# Patient Record
Sex: Female | Born: 1945 | Race: White | Hispanic: No | Marital: Married | Smoking: Current every day smoker
Health system: Southern US, Community
[De-identification: ages and names within clinical notes are randomized; demographics above are authoritative.]

## PROBLEM LIST (undated history)

## (undated) DIAGNOSIS — M4854XA Collapsed vertebra, not elsewhere classified, thoracic region, initial encounter for fracture: Secondary | ICD-10-CM

## (undated) DIAGNOSIS — I10 Essential (primary) hypertension: Secondary | ICD-10-CM

## (undated) DIAGNOSIS — R911 Solitary pulmonary nodule: Secondary | ICD-10-CM

## (undated) DIAGNOSIS — IMO0002 Reserved for concepts with insufficient information to code with codable children: Secondary | ICD-10-CM

## (undated) DIAGNOSIS — K5900 Constipation, unspecified: Secondary | ICD-10-CM

## (undated) DIAGNOSIS — IMO0001 Reserved for inherently not codable concepts without codable children: Secondary | ICD-10-CM

## (undated) DIAGNOSIS — J449 Chronic obstructive pulmonary disease, unspecified: Secondary | ICD-10-CM

## (undated) DIAGNOSIS — K429 Umbilical hernia without obstruction or gangrene: Secondary | ICD-10-CM

## (undated) DIAGNOSIS — I714 Abdominal aortic aneurysm, without rupture, unspecified: Secondary | ICD-10-CM

## (undated) DIAGNOSIS — C801 Malignant (primary) neoplasm, unspecified: Secondary | ICD-10-CM

## (undated) DIAGNOSIS — M1990 Unspecified osteoarthritis, unspecified site: Secondary | ICD-10-CM

## (undated) DIAGNOSIS — J45909 Unspecified asthma, uncomplicated: Secondary | ICD-10-CM

## (undated) DIAGNOSIS — J189 Pneumonia, unspecified organism: Secondary | ICD-10-CM

## (undated) DIAGNOSIS — H3530 Unspecified macular degeneration: Secondary | ICD-10-CM

## (undated) DIAGNOSIS — E0590 Thyrotoxicosis, unspecified without thyrotoxic crisis or storm: Secondary | ICD-10-CM

## (undated) HISTORY — DX: Solitary pulmonary nodule: R91.1

## (undated) HISTORY — DX: Collapsed vertebra, not elsewhere classified, thoracic region, initial encounter for fracture: M48.54XA

## (undated) HISTORY — PX: APPENDECTOMY: SHX54

## (undated) HISTORY — DX: Reserved for concepts with insufficient information to code with codable children: IMO0002

## (undated) HISTORY — DX: Chronic obstructive pulmonary disease, unspecified: J44.9

## (undated) HISTORY — DX: Reserved for inherently not codable concepts without codable children: IMO0001

## (undated) HISTORY — DX: Essential (primary) hypertension: I10

## (undated) HISTORY — PX: DILATION AND CURETTAGE OF UTERUS: SHX78

## (undated) HISTORY — DX: Unspecified asthma, uncomplicated: J45.909

---

## 2013-03-10 ENCOUNTER — Ambulatory Visit
Admission: RE | Admit: 2013-03-10 | Discharge: 2013-03-10 | Disposition: A | Discharge status: Home / Self Care | Payer: Managed Care, Other (non HMO) | Source: Ambulatory Visit | Attending: Emergency Medicine | Admitting: Emergency Medicine

## 2013-03-10 ENCOUNTER — Other Ambulatory Visit: Payer: Self-pay | Admitting: Emergency Medicine

## 2013-03-10 DIAGNOSIS — J189 Pneumonia, unspecified organism: Secondary | ICD-10-CM

## 2013-03-10 MED ORDER — IOHEXOL 300 MG/ML  SOLN
75.0000 mL | Freq: Once | INTRAMUSCULAR | Status: AC | PRN
  Administered 2013-03-10: 75 mL via INTRAVENOUS

## 2013-03-24 ENCOUNTER — Encounter: Payer: Self-pay | Admitting: Emergency Medicine

## 2013-03-26 ENCOUNTER — Ambulatory Visit (INDEPENDENT_AMBULATORY_CARE_PROVIDER_SITE_OTHER): Payer: Managed Care, Other (non HMO) | Admitting: Emergency Medicine

## 2013-03-26 ENCOUNTER — Encounter: Payer: Self-pay | Admitting: Emergency Medicine

## 2013-03-26 VITALS — BP 100/70 | HR 100 | Ht 64.0 in | Wt 146.8 lb

## 2013-03-26 DIAGNOSIS — R918 Other nonspecific abnormal finding of lung field: Secondary | ICD-10-CM | POA: Insufficient documentation

## 2013-03-26 DIAGNOSIS — J449 Chronic obstructive pulmonary disease, unspecified: Secondary | ICD-10-CM | POA: Insufficient documentation

## 2013-03-26 DIAGNOSIS — F17200 Nicotine dependence, unspecified, uncomplicated: Secondary | ICD-10-CM | POA: Insufficient documentation

## 2013-03-26 NOTE — Patient Instructions (Signed)
Walking oximetry today We will repeat your CT scan chest in 6 months Full pulmonary function testing at your next office visit Follow with Dr Delton Coombes next available with full PFT.

## 2013-03-26 NOTE — Assessment & Plan Note (Signed)
-   will plan to repeat her CT scan in 6 months for interval change

## 2013-03-26 NOTE — Assessment & Plan Note (Signed)
She is cutting down, is now smoking 1 pk/day (from 2pk/day).  - discussed cessation, not ready to set quit date at this time.

## 2013-03-26 NOTE — Progress Notes (Signed)
Subjective:    Patient ID: Amber Santiago, female    DOB: [birth year], 67 y.o.   MRN: [id number]  HPI 67 yo smoker (30 pk-yrs), hx of HTN, [facility] and for small pulm nodules seen on CT scan chest 03/10/13. Probably allergies, ? Hx asthma in the past. She saw Dr Steva Ready for an AE-COPD +/- PNA, treated as an outpt. Was given O2 at that time.  She is active, is able to exert but paces herself. Wheezes occasionally.    Review of Systems  Constitutional: Negative for fever and unexpected weight change.  HENT: Positive for sinus pressure. Negative for congestion, dental problem, ear pain, nosebleeds, postnasal drip, rhinorrhea, sneezing, sore throat and trouble swallowing.   Eyes: Negative for redness and itching.  Respiratory: Positive for cough and shortness of breath. Negative for chest tightness and wheezing.   Cardiovascular: Negative for palpitations and leg swelling.  Gastrointestinal: Negative for nausea and vomiting.  Genitourinary: Negative for dysuria.  Musculoskeletal: Negative for joint swelling.  Skin: Negative for rash.  Neurological: Negative for headaches.  Hematological: Does not bruise/bleed easily.  Psychiatric/Behavioral: The patient is nervous/anxious.    Past Medical History  Diagnosis Date  . COPD (chronic obstructive pulmonary disease)   . Asthma   . Hypertension   . Lung nodule      No family history on file.   History   Social History  . Marital Status: Married    Spouse Name: N/A    Number of Children: N/A  . Years of Education: N/A   Occupational History  . retired    Social History Main Topics  . Smoking status: Current Every Day Smoker -- 1.00 packs/day for 30 years    Types: Cigarettes  . Smokeless tobacco: Not on file  . Alcohol Use: No  . Drug Use: No  . Sexual Activity: Not on file   Other Topics Concern  . Not on file   Social History Narrative  . No narrative on file  originally from [place], has lived in the [place], [place], [place], [place]   Allergies  Allergen Reactions  . Amoxicillin      Outpatient Prescriptions Prior to Visit  Medication Sig Dispense Refill  . albuterol (PROVENTIL HFA;VENTOLIN HFA) 108 (90 BASE) MCG/ACT inhaler Inhale 2 puffs into the lungs every 4 (four) hours as needed for wheezing or shortness of breath.      . benzonatate (TESSALON) 100 MG capsule Take 100 mg by mouth 3 (three) times daily as needed for cough.       No facility-administered medications prior to visit.       Objective:   Physical Exam Filed Vitals:   03/26/13 1117  BP: 100/70  Pulse: 100  Height: 5\' 4"  (1.626 m)  Weight: 146 lb 12.8 oz (66.588 kg)  SpO2: 95%   Gen: Pleasant, well-nourished, in no distress,  normal affect  ENT: No lesions,  mouth clear,  oropharynx clear, no postnasal drip  Neck: No JVD, no TMG, no carotid bruits  Lungs: No use of accessory muscles, no dullness to percussion, clear without rales or rhonchi  Cardiovascular: RRR, heart sounds normal, no murmur or gallops, no peripheral edema  Musculoskeletal: No deformities, no cyanosis or clubbing  Neuro: alert, non focal  Skin: Warm, no lesions or rashes   03/10/13 --  FINDINGS:  On lung window images, there is significant biapical pleural and  parenchymal scarring present. Within the left upper lobe there  is a  4 mm noncalcified nodule anteriorly on image number 18 and a 4 mm  noncalcified nodule more inferiorly and anteriorly on image number  21. There is a peripheral noncalcified nodule in the right upper  lobe subpleural in location of 5 mm in diameter, and a calcified  granuloma is noted posteriorly within the right upper lobe near the  apex. A 3 mm noncalcified nodule is noted in the right middle lobe  on image number 36. These findings may all be due to prior  granulomatous disease, but a followup CT of the chest in 3-4 months  is recommended to evaluate stability. No lung  infiltrate is seen and  there is no evidence of pleural effusion. There is scarring noted  within the lingula. Mild paraseptal emphysema is present. The  central airway is patent.  On soft tissue window images, the thyroid gland is normal in size.  The thoracic aorta opacifies with no acute abnormality and the  origins of the great vessels appear patent. The pulmonary arteries  are well opacified with no acute abnormality noted. Only a few small  mediastinal lymph nodes are present none of which appear  pathologically enlarged. Faint coronary artery calcification is  present within the distribution of the left anterior descending  artery. No definite abnormality of the upper abdomen is visualized.  Direct correlation with the outside chest x-ray would be helpful to  determine the etiology of the abnormality questioned on outside  chest x-ray.  IMPRESSION:  1. Small lung nodules at least one of which is calcified suggesting  a possible granulomatous origin. However a followup CT of the chest  is recommended in 3-4 months to assess stability.  2. No calcified mass is noted as questioned on outside chest x-ray.  Direct correlation with that outside chest x-ray would be helpful.  3. Mild paraseptal emphysema.      Assessment & Plan:  Tobacco use disorder She is cutting down, is now smoking 1 pk/day (from 2pk/day).  - discussed cessation, not ready to set quit date at this time.   Pulmonary nodules - will plan to repeat her CT scan in 6 months for interval change   COPD (chronic obstructive pulmonary disease) - albuterol prn for now - full PFT to see if she will benefit from scheduled BD's

## 2013-03-26 NOTE — Assessment & Plan Note (Signed)
-   albuterol prn for now - full PFT to see if she will benefit from scheduled BD's

## 2013-05-12 ENCOUNTER — Ambulatory Visit: Payer: Managed Care, Other (non HMO) | Admitting: Emergency Medicine

## 2013-06-16 ENCOUNTER — Ambulatory Visit (INDEPENDENT_AMBULATORY_CARE_PROVIDER_SITE_OTHER): Payer: Managed Care, Other (non HMO) | Admitting: Emergency Medicine

## 2013-06-16 ENCOUNTER — Encounter (INDEPENDENT_AMBULATORY_CARE_PROVIDER_SITE_OTHER): Payer: Managed Care, Other (non HMO) | Admitting: Emergency Medicine

## 2013-06-16 ENCOUNTER — Encounter: Payer: Self-pay | Admitting: Emergency Medicine

## 2013-06-16 VITALS — BP 110/68 | HR 83

## 2013-06-16 DIAGNOSIS — J449 Chronic obstructive pulmonary disease, unspecified: Secondary | ICD-10-CM

## 2013-06-16 DIAGNOSIS — J4489 Other specified chronic obstructive pulmonary disease: Secondary | ICD-10-CM

## 2013-06-16 LAB — PULMONARY FUNCTION TEST
DL/VA % pred: 87 %
DL/VA: 4.2 ml/min/mmHg/L
DLCO unc % pred: 59 %
DLCO unc: 14.4 ml/min/mmHg
FEF 25-75 Post: 0.56 L/sec
FEF 25-75 Pre: 0.35 L/sec
FEF2575-%CHANGE-POST: 58 %
FEF2575-%PRED-POST: 28 %
FEF2575-%Pred-Pre: 18 %
FEV1-%CHANGE-POST: 19 %
FEV1-%Pred-Post: 43 %
FEV1-%Pred-Pre: 36 %
FEV1-Post: 1 L
FEV1-Pre: 0.84 L
FEV1FVC-%Change-Post: 7 %
FEV1FVC-%Pred-Pre: 67 %
FEV6-%Change-Post: 6 %
FEV6-%PRED-POST: 58 %
FEV6-%Pred-Pre: 54 %
FEV6-POST: 1.69 L
FEV6-Pre: 1.59 L
FEV6FVC-%CHANGE-POST: -3 %
FEV6FVC-%PRED-PRE: 101 %
FEV6FVC-%Pred-Post: 97 %
FVC-%Change-Post: 11 %
FVC-%PRED-PRE: 53 %
FVC-%Pred-Post: 60 %
FVC-POST: 1.82 L
FVC-Pre: 1.63 L
POST FEV1/FVC RATIO: 55 %
PRE FEV6/FVC RATIO: 97 %
Post FEV6/FVC ratio: 94 %
Pre FEV1/FVC ratio: 51 %
RV % PRED: 182 %
RV: 3.95 L
TLC % PRED: 122 %
TLC: 6.21 L

## 2013-06-16 NOTE — Progress Notes (Signed)
PFT done today. 

## 2013-06-16 NOTE — Assessment & Plan Note (Signed)
Discussed PFT with her today.  - trial Spiriva  - albuterol prn - rov 1

## 2013-06-16 NOTE — Patient Instructions (Addendum)
We will start Spiriva daily to see if it will help your breathing Follow with Dr Lamonte Sakai in 1 month to discuss whether the medication is helping you Call our office if you have any trouble with the medication

## 2013-06-16 NOTE — Progress Notes (Signed)
Subjective:    Patient ID: Amber Santiago, female    DOB: [birth year], 68 y.o.   MRN: [id number]  HPI 68 yo smoker (30 pk-yrs), hx of HTN, [facility] and for small pulm nodules seen on CT scan chest 03/10/13. Probably allergies, ? Hx asthma in the past. She saw Dr Doristine Johns for an AE-COPD +/- PNA, treated as an outpt. Was given O2 at that time.  She is active, is able to exert but paces herself. Wheezes occasionally.   ROV 06/16/13 -- follows up for dyspnea and smoking. PFT today > severe AFL, borderline BD response. Hyperinflated volumes, decreased DLCO that corrects for [place]. She admits today that her breathing has declined over the years.    Review of Systems  Constitutional: Negative for fever and unexpected weight change.  HENT: Positive for sinus pressure. Negative for congestion, dental problem, ear pain, nosebleeds, postnasal drip, rhinorrhea, sneezing, sore throat and trouble swallowing.   Eyes: Negative for redness and itching.  Respiratory: Positive for cough and shortness of breath. Negative for chest tightness and wheezing.   Cardiovascular: Negative for palpitations and leg swelling.  Gastrointestinal: Negative for nausea and vomiting.  Genitourinary: Negative for dysuria.  Musculoskeletal: Negative for joint swelling.  Skin: Negative for rash.  Neurological: Negative for headaches.  Hematological: Does not bruise/bleed easily.  Psychiatric/Behavioral: The patient is nervous/anxious.    Past Medical History  Diagnosis Date  . COPD (chronic obstructive pulmonary disease)   . Asthma   . Hypertension   . Lung nodule      No family history on file.   History   Social History  . Marital Status: Married    Spouse Name: N/A    Number of Children: N/A  . Years of Education: N/A   Occupational History  . retired    Social History Main Topics  . Smoking status: Current Every Day Smoker -- 1.00 packs/day for 30 years    Types: Cigarettes  .  Smokeless tobacco: Not on file  . Alcohol Use: No  . Drug Use: No  . Sexual Activity: Not on file   Other Topics Concern  . Not on file   Social History Narrative  . No narrative on file  originally from [place], has lived in the [place], [place], [place], [place]   Allergies  Allergen Reactions  . Penicillins Anaphylaxis  . Amoxicillin      Outpatient Prescriptions Prior to Visit  Medication Sig Dispense Refill  . albuterol (PROVENTIL HFA;VENTOLIN HFA) 108 (90 BASE) MCG/ACT inhaler Inhale 2 puffs into the lungs every 4 (four) hours as needed for wheezing or shortness of breath.      . benzonatate (TESSALON) 100 MG capsule Take 100 mg by mouth 3 (three) times daily as needed for cough.      . furosemide (LASIX) 20 MG tablet 1 tablet daily.      Marland Kitchen losartan (COZAAR) 50 MG tablet 1 tablet daily.       No facility-administered medications prior to visit.       Objective:   Physical Exam Filed Vitals:   06/16/13 1623  BP: 110/68  Pulse: 83  SpO2: 95%   Gen: Pleasant, well-nourished, in no distress,  normal affect  ENT: No lesions,  mouth clear,  oropharynx clear, no postnasal drip  Neck: No JVD, no TMG, no carotid bruits  Lungs: No use of accessory muscles, no dullness to percussion, clear without rales or rhonchi  Cardiovascular: RRR, heart  sounds normal, no murmur or gallops, no peripheral edema  Musculoskeletal: No deformities, no cyanosis or clubbing  Neuro: alert, non focal  Skin: Warm, no lesions or rashes   03/10/13 --  FINDINGS:  On lung window images, there is significant biapical pleural and  parenchymal scarring present. Within the left upper lobe there is a  4 mm noncalcified nodule anteriorly on image number 18 and a 4 mm  noncalcified nodule more inferiorly and anteriorly on image number  21. There is a peripheral noncalcified nodule in the right upper  lobe subpleural in location of 5 mm in diameter, and a calcified  granuloma is noted  posteriorly within the right upper lobe near the  apex. A 3 mm noncalcified nodule is noted in the right middle lobe  on image number 36. These findings may all be due to prior  granulomatous disease, but a followup CT of the chest in 3-4 months  is recommended to evaluate stability. No lung infiltrate is seen and  there is no evidence of pleural effusion. There is scarring noted  within the lingula. Mild paraseptal emphysema is present. The  central airway is patent.  On soft tissue window images, the thyroid gland is normal in size.  The thoracic aorta opacifies with no acute abnormality and the  origins of the great vessels appear patent. The pulmonary arteries  are well opacified with no acute abnormality noted. Only a few small  mediastinal lymph nodes are present none of which appear  pathologically enlarged. Faint coronary artery calcification is  present within the distribution of the left anterior descending  artery. No definite abnormality of the upper abdomen is visualized.  Direct correlation with the outside chest x-ray would be helpful to  determine the etiology of the abnormality questioned on outside  chest x-ray.  IMPRESSION:  1. Small lung nodules at least one of which is calcified suggesting  a possible granulomatous origin. However a followup CT of the chest  is recommended in 3-4 months to assess stability.  2. No calcified mass is noted as questioned on outside chest x-ray.  Direct correlation with that outside chest x-ray would be helpful.  3. Mild paraseptal emphysema.      Assessment & Plan:  COPD (chronic obstructive pulmonary disease) Discussed PFT with her today.  - trial Spiriva  - albuterol prn - rov 1

## 2013-07-17 ENCOUNTER — Ambulatory Visit (INDEPENDENT_AMBULATORY_CARE_PROVIDER_SITE_OTHER): Payer: Managed Care, Other (non HMO) | Admitting: Emergency Medicine

## 2013-07-17 ENCOUNTER — Encounter: Payer: Self-pay | Admitting: Emergency Medicine

## 2013-07-17 VITALS — BP 128/72 | HR 63 | Temp 97.3°F | Ht 64.0 in | Wt 144.0 lb

## 2013-07-17 DIAGNOSIS — R918 Other nonspecific abnormal finding of lung field: Secondary | ICD-10-CM

## 2013-07-17 DIAGNOSIS — J449 Chronic obstructive pulmonary disease, unspecified: Secondary | ICD-10-CM

## 2013-07-17 MED ORDER — AEROCHAMBER MV MISC: Status: DC

## 2013-07-17 NOTE — Assessment & Plan Note (Signed)
Need a repeat CT scan in June '15

## 2013-07-17 NOTE — Assessment & Plan Note (Signed)
Stay off Spiriva Continue loratadine 10mg  daily.  You need to keep working on trying to decrease your smoking.  We will try starting Symbicort 2 puffs twice a day with a spacer. Please gargle and rinse your mouth after using Follow with Dr Lamonte Sakai in 4 months or sooner if you have any problems

## 2013-07-17 NOTE — Addendum Note (Signed)
Addended by: Maurice March on: 07/17/2013 04:06 PM   Modules accepted: Orders

## 2013-07-17 NOTE — Patient Instructions (Addendum)
Stay off Spiriva Continue loratadine 10mg  daily.  You need to keep working on trying to decrease your smoking.  We will try starting Symbicort 2 puffs twice a day with a spacer. Please gargle and rinse your mouth after using Call our office to let us know if you have benefited from the medication Walking oximetry today Follow with Dr Lamonte Sakai in 4 months or sooner if you have any problems.

## 2013-07-17 NOTE — Progress Notes (Signed)
Subjective:    Patient ID: Amber Santiago, female    DOB: Jun 04, 1945, 68 y.o.   MRN: [id number]  HPI 68 yo smoker (30 pk-yrs), hx of HTN, [facility] for [facility] and for small pulm nodules seen on CT scan chest 03/10/13. Probably allergies, ? Hx asthma in the past. She saw Dr Doristine Johns for an AE-COPD +/- PNA, treated as an outpt. Was given O2 at that time.  She is active, is able to exert but paces herself. Wheezes occasionally.   ROV 06/16/13 -- follows up for dyspnea and smoking. PFT today > severe AFL, borderline BD response. Hyperinflated volumes, decreased DLCO that corrects for Va. She admits today that her breathing has declined over the years.   ROV 07/17/13 -- follows for COPD. Last time we started Spiriva to see if she would benefit. She stopped it after 11 days due to cough and the fact that it had not helped. She has been doing well. She remains on loratadine. She does complain of occasional dyspnea.    Review of Systems  Constitutional: Negative for fever and unexpected weight change.  HENT: Positive for sinus pressure. Negative for congestion, dental problem, ear pain, nosebleeds, postnasal drip, rhinorrhea, sneezing, sore throat and trouble swallowing.   Eyes: Negative for redness and itching.  Respiratory: Positive for cough and shortness of breath. Negative for chest tightness and wheezing.   Cardiovascular: Negative for palpitations and leg swelling.  Gastrointestinal: Negative for nausea and vomiting.  Genitourinary: Negative for dysuria.  Musculoskeletal: Negative for joint swelling.  Skin: Negative for rash.  Neurological: Negative for headaches.  Hematological: Does not bruise/bleed easily.  Psychiatric/Behavioral: The patient is nervous/anxious.       Objective:   Physical Exam Filed Vitals:   07/17/13 1504  BP: 128/72  Pulse: 63  Temp: 97.3 F (36.3 C)  TempSrc: Oral  Height: 5\' 4"  (1.626 m)  Weight: 144 lb (65.318 kg)  SpO2: 98%   Gen:  Pleasant, well-nourished, in no distress,  normal affect  ENT: No lesions,  mouth clear,  oropharynx clear, no postnasal drip  Neck: No JVD, no TMG, no carotid bruits  Lungs: No use of accessory muscles, no dullness to percussion, clear without rales or rhonchi  Cardiovascular: RRR, heart sounds normal, no murmur or gallops, no peripheral edema  Musculoskeletal: No deformities, no cyanosis or clubbing  Neuro: alert, non focal  Skin: Warm, no lesions or rashes   03/10/13 --  FINDINGS:  On lung window images, there is significant biapical pleural and  parenchymal scarring present. Within the left upper lobe there is a  4 mm noncalcified nodule anteriorly on image number 18 and a 4 mm  noncalcified nodule more inferiorly and anteriorly on image number  21. There is a peripheral noncalcified nodule in the right upper  lobe subpleural in location of 5 mm in diameter, and a calcified  granuloma is noted posteriorly within the right upper lobe near the  apex. A 3 mm noncalcified nodule is noted in the right middle lobe  on image number 36. These findings may all be due to prior  granulomatous disease, but a followup CT of the chest in 3-4 months  is recommended to evaluate stability. No lung infiltrate is seen and  there is no evidence of pleural effusion. There is scarring noted  within the lingula. Mild paraseptal emphysema is present. The  central airway is patent.  On soft tissue window images, the thyroid gland is normal in  size.  The thoracic aorta opacifies with no acute abnormality and the  origins of the great vessels appear patent. The pulmonary arteries  are well opacified with no acute abnormality noted. Only a few small  mediastinal lymph nodes are present none of which appear  pathologically enlarged. Faint coronary artery calcification is  present within the distribution of the left anterior descending  artery. No definite abnormality of the upper abdomen is visualized.   Direct correlation with the outside chest x-ray would be helpful to  determine the etiology of the abnormality questioned on outside  chest x-ray.  IMPRESSION:  1. Small lung nodules at least one of which is calcified suggesting  a possible granulomatous origin. However a followup CT of the chest  is recommended in 3-4 months to assess stability.  2. No calcified mass is noted as questioned on outside chest x-ray.  Direct correlation with that outside chest x-ray would be helpful.  3. Mild paraseptal emphysema.      Assessment & Plan:  COPD (chronic obstructive pulmonary disease) Stay off Spiriva Continue loratadine 10mg  daily.  You need to keep working on trying to decrease your smoking.  We will try starting Symbicort 2 puffs twice a day with a spacer. Please gargle and rinse your mouth after using Follow with Dr Lamonte Sakai in 4 months or sooner if you have any problems  Pulmonary nodules Need a repeat CT scan in June '15

## 2013-08-17 ENCOUNTER — Telehealth: Payer: Self-pay | Admitting: Emergency Medicine

## 2013-08-17 MED ORDER — BUDESONIDE-FORMOTEROL FUMARATE 160-4.5 MCG/ACT IN AERO: 2.0000 | INHALATION_SPRAY | Freq: Two times a day (BID) | RESPIRATORY_TRACT | Status: DC

## 2013-08-17 NOTE — Telephone Encounter (Signed)
Spoke with the pt She states that she is needing rx called in for symbicort  She feels that this has been helping with her SOB I verified which strength he gave her, and sent rx to pharm

## 2013-08-20 ENCOUNTER — Telehealth: Payer: Self-pay | Admitting: Emergency Medicine

## 2013-08-20 NOTE — Telephone Encounter (Signed)
Spoke with the pt  She states already spoke with someone regarding ct appt and nothing further needed

## 2013-09-25 ENCOUNTER — Ambulatory Visit (INDEPENDENT_AMBULATORY_CARE_PROVIDER_SITE_OTHER)
Admission: RE | Admit: 2013-09-25 | Discharge: 2013-09-25 | Disposition: A | Discharge status: Home / Self Care | Payer: Managed Care, Other (non HMO) | Source: Ambulatory Visit | Attending: Emergency Medicine | Admitting: Emergency Medicine

## 2013-09-25 DIAGNOSIS — R918 Other nonspecific abnormal finding of lung field: Secondary | ICD-10-CM

## 2013-09-30 ENCOUNTER — Telehealth: Payer: Self-pay | Admitting: Emergency Medicine

## 2013-09-30 NOTE — Telephone Encounter (Signed)
Spoke with the pt  She states that she was seen by her GYN and was referred to Urologist for problems with her "water works" She states that she was unable to keep the appt they gave her and b/c of this they were "very rude"  She is asking if [facility] will refer her to a different Urologist besides Dr Milta Deiters  Per RB- needs to be done by PCP or whoever originally referred her ATC the pt back and NA  LMTCB

## 2013-09-30 NOTE — Telephone Encounter (Signed)
Pt returned Leslie's call Advised her to call her PCP or the original provider whom referred her to Urology - pt okay with this and verbalized her understanding.  Pt is now requesting her 6.19.15 CT Chest results and is okay with a call back tomorrow 6.25 Dr Lamonte Sakai please advise, thank you.

## 2013-10-01 NOTE — Telephone Encounter (Signed)
Pt advised. Mayank Teuscher, CMA  

## 2013-10-01 NOTE — Telephone Encounter (Signed)
Please let her know that her pulmonary nodules are all stable - no change. We will need to continue to follow them over time. This is good news

## 2013-10-01 NOTE — Telephone Encounter (Signed)
[id number] x1 

## 2013-12-03 ENCOUNTER — Encounter: Payer: Self-pay | Admitting: Emergency Medicine

## 2013-12-03 ENCOUNTER — Ambulatory Visit (INDEPENDENT_AMBULATORY_CARE_PROVIDER_SITE_OTHER): Payer: Managed Care, Other (non HMO) | Admitting: Emergency Medicine

## 2013-12-03 VITALS — BP 120/80 | HR 62 | Ht 64.0 in | Wt 147.6 lb

## 2013-12-03 DIAGNOSIS — R918 Other nonspecific abnormal finding of lung field: Secondary | ICD-10-CM

## 2013-12-03 DIAGNOSIS — J449 Chronic obstructive pulmonary disease, unspecified: Secondary | ICD-10-CM

## 2013-12-03 MED ORDER — ALBUTEROL SULFATE HFA 108 (90 BASE) MCG/ACT IN AERS: 2.0000 | INHALATION_SPRAY | RESPIRATORY_TRACT | Status: DC | PRN

## 2013-12-03 NOTE — Progress Notes (Signed)
Subjective:    Patient ID: Amber Santiago, female    DOB: April 29, 1945, 68 y.o.   MRN: [id number]  HPI 68 yo smoker (30 pk-yrs), hx of HTN, [facility] and for small pulm nodules seen on CT scan chest 03/10/13. Probably allergies, ? Hx asthma in the past. She saw Dr Doristine Johns for an AE-COPD +/- PNA, treated as an outpt. Was given O2 at that time.  She is active, is able to exert but paces herself. Wheezes occasionally.   ROV 06/16/13 -- follows up for dyspnea and smoking. PFT today > severe AFL, borderline BD response. Hyperinflated volumes, decreased DLCO that corrects for Va. She admits today that her breathing has declined over the years.   ROV 07/17/13 -- follows for COPD. Last time we started Spiriva to see if she would benefit. She stopped it after 11 days due to cough and the fact that it had not helped. She has been doing well. She remains on loratadine. She does complain of occasional dyspnea.   ROV 12/03/13 -- follow up visit for COPD. She started symbicort last time. Her breathing has improved significantly. She is on loratadine.  Will need to get her CT chest in Oct or Nov (she will likely be in [place] in December).    Review of Systems  Constitutional: Negative for fever and unexpected weight change.  HENT: Positive for sinus pressure. Negative for congestion, dental problem, ear pain, nosebleeds, postnasal drip, rhinorrhea, sneezing, sore throat and trouble swallowing.   Eyes: Negative for redness and itching.  Respiratory: Positive for cough and shortness of breath. Negative for chest tightness and wheezing.   Cardiovascular: Negative for palpitations and leg swelling.  Gastrointestinal: Negative for nausea and vomiting.  Genitourinary: Negative for dysuria.  Musculoskeletal: Negative for joint swelling.  Skin: Negative for rash.  Neurological: Negative for headaches.  Hematological: Does not bruise/bleed easily.  Psychiatric/Behavioral: The patient  is nervous/anxious.       Objective:   Physical Exam Filed Vitals:   12/03/13 1531  BP: 120/80  Pulse: 62  Height: 5\' 4"  (1.626 m)  Weight: 147 lb 9.6 oz (66.951 kg)  SpO2: 98%   Gen: Pleasant, well-nourished, in no distress,  normal affect  ENT: No lesions,  mouth clear,  oropharynx clear, no postnasal drip  Neck: No JVD, no TMG, no carotid bruits  Lungs: No use of accessory muscles, no dullness to percussion, clear without rales or rhonchi  Cardiovascular: RRR, heart sounds normal, no murmur or gallops, no peripheral edema  Musculoskeletal: No deformities, no cyanosis or clubbing  Neuro: alert, non focal  Skin: Warm, no lesions or rashes   03/10/13 --  FINDINGS:  On lung window images, there is significant biapical pleural and  parenchymal scarring present. Within the left upper lobe there is a  4 mm noncalcified nodule anteriorly on image number 18 and a 4 mm  noncalcified nodule more inferiorly and anteriorly on image number  21. There is a peripheral noncalcified nodule in the right upper  lobe subpleural in location of 5 mm in diameter, and a calcified  granuloma is noted posteriorly within the right upper lobe near the  apex. A 3 mm noncalcified nodule is noted in the right middle lobe  on image number 36. These findings may all be due to prior  granulomatous disease, but a followup CT of the chest in 3-4 months  is recommended to evaluate stability. No lung infiltrate is seen and  there  is no evidence of pleural effusion. There is scarring noted  within the lingula. Mild paraseptal emphysema is present. The  central airway is patent.  On soft tissue window images, the thyroid gland is normal in size.  The thoracic aorta opacifies with no acute abnormality and the  origins of the great vessels appear patent. The pulmonary arteries  are well opacified with no acute abnormality noted. Only a few small  mediastinal lymph nodes are present none of which appear   pathologically enlarged. Faint coronary artery calcification is  present within the distribution of the left anterior descending  artery. No definite abnormality of the upper abdomen is visualized.  Direct correlation with the outside chest x-ray would be helpful to  determine the etiology of the abnormality questioned on outside  chest x-ray.  IMPRESSION:  1. Small lung nodules at least one of which is calcified suggesting  a possible granulomatous origin. However a followup CT of the chest  is recommended in 3-4 months to assess stability.  2. No calcified mass is noted as questioned on outside chest x-ray.  Direct correlation with that outside chest x-ray would be helpful.  3. Mild paraseptal emphysema.      Assessment & Plan:  COPD (chronic obstructive pulmonary disease) Has been doing well - improved on Symbicort Discussed tobacco cessation   Pulmonary nodules - will repeat her Ct scan early as she is going to [place] in november

## 2013-12-03 NOTE — Assessment & Plan Note (Signed)
Has been doing well - improved on Symbicort Discussed tobacco cessation

## 2013-12-03 NOTE — Patient Instructions (Signed)
Please continue your Symbicort twice a day  We will set up your CT scan for October 2015 Use albuterol 2 puffs as needed for shortness of breath Follow with Dr Lamonte Sakai in 6 months or sooner if you have any problems

## 2013-12-03 NOTE — Assessment & Plan Note (Signed)
-   will repeat her Ct scan early as she is going to [place] in november

## 2014-01-21 ENCOUNTER — Other Ambulatory Visit: Payer: Self-pay | Admitting: Emergency Medicine

## 2014-01-26 ENCOUNTER — Other Ambulatory Visit: Payer: Managed Care, Other (non HMO)

## 2014-02-02 ENCOUNTER — Ambulatory Visit (INDEPENDENT_AMBULATORY_CARE_PROVIDER_SITE_OTHER)
Admission: RE | Admit: 2014-02-02 | Discharge: 2014-02-02 | Disposition: A | Discharge status: Home / Self Care | Payer: Managed Care, Other (non HMO) | Source: Ambulatory Visit | Attending: Emergency Medicine | Admitting: Emergency Medicine

## 2014-02-02 DIAGNOSIS — R918 Other nonspecific abnormal finding of lung field: Secondary | ICD-10-CM

## 2014-02-03 ENCOUNTER — Telehealth: Payer: Self-pay | Admitting: Emergency Medicine

## 2014-02-03 NOTE — Telephone Encounter (Signed)
Called and spoke with the pt and she is requesting the results of the CT scan form RB.  RB please advise of results.  thanks

## 2014-02-04 NOTE — Telephone Encounter (Signed)
Please let the patient know that her Ct scan is stable. There has been no change in her lung nodules at all. We can discuss whether she needs to have any further scans when we see each other in office.

## 2014-02-05 NOTE — Telephone Encounter (Signed)
Spoke with the pt and notified of recs per RB  She verbalized understanding  Nothing further needed 

## 2014-02-05 NOTE — Telephone Encounter (Signed)
Pt returned call & can be reached at [phone].  Amber Santiago

## 2014-02-05 NOTE — Telephone Encounter (Signed)
[id number] x1 

## 2014-02-10 ENCOUNTER — Telehealth: Payer: Self-pay | Admitting: Emergency Medicine

## 2014-02-10 NOTE — Telephone Encounter (Signed)
Patient called back to speak to [facility] about Symbicort, pt states she does not use [facility], she uses Paediatric nurse on [address]

## 2014-02-10 NOTE — Telephone Encounter (Signed)
Received letter from [facility], they will no longer cover Symbicort, per Dr. Lamonte Sakai, call patient and advise her that we will need to switch her to [facility].  Left message for patient to call me back.  Awaiting call from patient.

## 2014-02-10 NOTE — Telephone Encounter (Signed)
Spoke to patient about Symbicort vs Dulera.  Patient does not want to switch to [facility].  Patient states she no longer uses [facility] and she uses [facility] for all of her prescriptions, she says Symbicort works for her and she will continue paying for it if she has too.  Nothing further needed at this time.

## 2014-06-18 ENCOUNTER — Other Ambulatory Visit: Payer: Self-pay | Admitting: Emergency Medicine

## 2014-06-25 ENCOUNTER — Ambulatory Visit (INDEPENDENT_AMBULATORY_CARE_PROVIDER_SITE_OTHER): Payer: Managed Care, Other (non HMO) | Admitting: Emergency Medicine

## 2014-06-25 ENCOUNTER — Encounter: Payer: Self-pay | Admitting: Emergency Medicine

## 2014-06-25 VITALS — BP 120/72 | HR 83 | Ht 63.0 in | Wt 146.0 lb

## 2014-06-25 DIAGNOSIS — J449 Chronic obstructive pulmonary disease, unspecified: Secondary | ICD-10-CM

## 2014-06-25 NOTE — Assessment & Plan Note (Addendum)
Her COPD appears to be stable at this time. She is definitely benefited from Symbicort. I reminded her to be sure to take it twice a day on the schedule as she occasionally cuts it down to once a day. Most of our time was spent discussing smoking cessation. Talked about strategies to cut down and we agreed that we set a new goal of one pack daily to be met by her next visit.    Please continue your Symbicort twice a day  Use albuterol 2 puffs up to every 4 hours if needed for shortness of breath.  We discussed that you would work hard to cut your cigarettes down to [place] Follow with Dr Lamonte Sakai in 6 months or sooner if you have any problems

## 2014-06-25 NOTE — Patient Instructions (Signed)
Please continue your Symbicort twice a day  Use albuterol 2 puffs up to every 4 hours if needed for shortness of breath.  We discussed that you would work hard to cut your cigarettes down to [place] Follow with Dr Lamonte Sakai in 6 months or sooner if you have any problems

## 2014-06-25 NOTE — Progress Notes (Signed)
Subjective:    Patient ID: Amber Santiago, female    DOB: 04/30/1945, 69 y.o.   MRN: [id number]  HPI 69 yo smoker (30 pk-yrs), hx of HTN, [facility] by [facility] Sandhu [facility] for COPD and for small pulm nodules seen on CT scan chest 03/10/13. Probably allergies, ? Hx asthma in the past. She saw Dr Sandhu for an AE-COPD +/- PNA, treated as an outpt. Was given O2 at that time.  She is active, is able to exert but paces herself. Wheezes occasionally.   ROV 06/16/13 -- follows up for dyspnea and smoking. PFT today > severe AFL, borderline BD response. Hyperinflated volumes, decreased DLCO that corrects for Va. She admits today that her breathing has declined over the years.   ROV 07/17/13 -- follows for COPD. Last time we started Spiriva to see if she would benefit. She stopped it after 11 days due to cough and the fact that it had not helped. She has been doing well. She remains on loratadine. She does complain of occasional dyspnea.   ROV 12/03/13 -- follow up visit for COPD. She started symbicort last time. Her breathing has improved significantly. She is on loratadine.  Will need to get her CT chest in Oct or Nov (she will likely be in [place] in December).   ROV 06/25/14 -- follow up today for COPD and pulmonary nodules. She has been doing quite well since last time. She has been taking her symbicort reliably. In the past she has had some episodes of panic, these seem to be happening less frequently. She has not had any flares. Hasn't needed her SABA. She is smoking over a pack a day.    Review of Systems  Constitutional: Negative for fever and unexpected weight change.  HENT: Positive for sinus pressure. Negative for congestion, dental problem, ear pain, nosebleeds, postnasal drip, rhinorrhea, sneezing, sore throat and trouble swallowing.   Eyes: Negative for redness and itching.  Respiratory: Positive for cough and shortness of breath. Negative for chest tightness and wheezing.   Cardiovascular:  Negative for palpitations and leg swelling.  Gastrointestinal: Negative for nausea and vomiting.  Genitourinary: Negative for dysuria.  Musculoskeletal: Negative for joint swelling.  Skin: Negative for rash.  Neurological: Negative for headaches.  Hematological: Does not bruise/bleed easily.  Psychiatric/Behavioral: The patient is nervous/anxious.       Objective:   Physical Exam Filed Vitals:   06/25/14 1626  BP: 120/72  Pulse: 83  Height: 5' 3" (1.6 m)  Weight: 146 lb (66.225 kg)  SpO2: 96%   Gen: Pleasant, well-nourished, in no distress,  normal affect  ENT: No lesions,  mouth clear,  oropharynx clear, no postnasal drip  Neck: No JVD, no TMG, no carotid bruits  Lungs: No use of accessory muscles, no dullness to percussion, clear without rales or rhonchi  Cardiovascular: RRR, heart sounds normal, no murmur or gallops, no peripheral edema  Musculoskeletal: No deformities, no cyanosis or clubbing  Neuro: alert, non focal  Skin: Warm, no lesions or rashes   03/10/13 --  FINDINGS:  On lung window images, there is significant biapical pleural and  parenchymal scarring present. Within the left upper lobe there is a  4 mm noncalcified nodule anteriorly on image number 18 and a 4 mm  noncalcified nodule more inferiorly and anteriorly on image number  21. There is a peripheral noncalcified nodule in the right upper  lobe subpleural in location of 5 mm in diameter, and a calcified  granuloma is noted   posteriorly within the right upper lobe near the  apex. A 3 mm noncalcified nodule is noted in the right middle lobe  on image number 36. These findings may all be due to prior  granulomatous disease, but a followup CT of the chest in 3-4 months  is recommended to evaluate stability. No lung infiltrate is seen and  there is no evidence of pleural effusion. There is scarring noted  within the lingula. Mild paraseptal emphysema is present. The  central airway is patent.  On soft  tissue window images, the thyroid gland is normal in size.  The thoracic aorta opacifies with no acute abnormality and the  origins of the great vessels appear patent. The pulmonary arteries  are well opacified with no acute abnormality noted. Only a few small  mediastinal lymph nodes are present none of which appear  pathologically enlarged. Faint coronary artery calcification is  present within the distribution of the left anterior descending  artery. No definite abnormality of the upper abdomen is visualized.  Direct correlation with the outside chest x-ray would be helpful to  determine the etiology of the abnormality questioned on outside  chest x-ray.  IMPRESSION:  1. Small lung nodules at least one of which is calcified suggesting  a possible granulomatous origin. However a followup CT of the chest  is recommended in 3-4 months to assess stability.  2. No calcified mass is noted as questioned on outside chest x-ray.  Direct correlation with that outside chest x-ray would be helpful.  3. Mild paraseptal emphysema.      Assessment & Plan:  COPD (chronic obstructive pulmonary disease) Her COPD appears to be stable at this time. She is definitely benefited from Symbicort. I reminded her to be sure to take it twice a day on the schedule as she occasionally cuts it down to once a day. Most of our time was spent discussing smoking cessation. Talked about strategies to cut down and we agreed that we set a new goal of one pack daily to be met by her next visit.    Please continue your Symbicort twice a day  Use albuterol 2 puffs up to every 4 hours if needed for shortness of breath.  We discussed that you would work hard to cut your cigarettes down to ONE PACK A DAY Follow with Dr Byrum in 6 months or sooner if you have any problems     

## 2014-07-19 ENCOUNTER — Telehealth: Payer: Self-pay | Admitting: Emergency Medicine

## 2014-07-19 MED ORDER — BUDESONIDE-FORMOTEROL FUMARATE 160-4.5 MCG/ACT IN AERO: 2.0000 | INHALATION_SPRAY | Freq: Two times a day (BID) | RESPIRATORY_TRACT | Status: DC

## 2014-07-19 NOTE — Telephone Encounter (Signed)
Rx has been sent in. Pt's husband is aware. Nothing further was needed.

## 2014-11-25 ENCOUNTER — Encounter (INDEPENDENT_AMBULATORY_CARE_PROVIDER_SITE_OTHER): Payer: Managed Care, Other (non HMO) | Admitting: Ophthalmology

## 2014-11-25 DIAGNOSIS — H3532 Exudative age-related macular degeneration: Secondary | ICD-10-CM | POA: Diagnosis not present

## 2014-11-25 DIAGNOSIS — H43813 Vitreous degeneration, bilateral: Secondary | ICD-10-CM

## 2014-11-25 DIAGNOSIS — H2513 Age-related nuclear cataract, bilateral: Secondary | ICD-10-CM | POA: Diagnosis not present

## 2014-12-23 ENCOUNTER — Encounter (INDEPENDENT_AMBULATORY_CARE_PROVIDER_SITE_OTHER): Payer: Managed Care, Other (non HMO) | Admitting: Ophthalmology

## 2014-12-23 DIAGNOSIS — H3532 Exudative age-related macular degeneration: Secondary | ICD-10-CM

## 2014-12-23 DIAGNOSIS — H43813 Vitreous degeneration, bilateral: Secondary | ICD-10-CM | POA: Diagnosis not present

## 2015-01-14 ENCOUNTER — Other Ambulatory Visit: Payer: Self-pay | Admitting: Emergency Medicine

## 2015-01-19 ENCOUNTER — Encounter (INDEPENDENT_AMBULATORY_CARE_PROVIDER_SITE_OTHER): Payer: Managed Care, Other (non HMO) | Admitting: Ophthalmology

## 2015-01-19 DIAGNOSIS — H43813 Vitreous degeneration, bilateral: Secondary | ICD-10-CM

## 2015-01-19 DIAGNOSIS — H2513 Age-related nuclear cataract, bilateral: Secondary | ICD-10-CM | POA: Diagnosis not present

## 2015-01-19 DIAGNOSIS — H353231 Exudative age-related macular degeneration, bilateral, with active choroidal neovascularization: Secondary | ICD-10-CM

## 2015-02-08 ENCOUNTER — Ambulatory Visit (INDEPENDENT_AMBULATORY_CARE_PROVIDER_SITE_OTHER): Payer: Managed Care, Other (non HMO) | Admitting: Emergency Medicine

## 2015-02-08 ENCOUNTER — Encounter: Payer: Self-pay | Admitting: Emergency Medicine

## 2015-02-08 VITALS — BP 120/84 | HR 56

## 2015-02-08 DIAGNOSIS — R918 Other nonspecific abnormal finding of lung field: Secondary | ICD-10-CM

## 2015-02-08 DIAGNOSIS — F17200 Nicotine dependence, unspecified, uncomplicated: Secondary | ICD-10-CM | POA: Diagnosis not present

## 2015-02-08 DIAGNOSIS — J449 Chronic obstructive pulmonary disease, unspecified: Secondary | ICD-10-CM | POA: Diagnosis not present

## 2015-02-08 DIAGNOSIS — R911 Solitary pulmonary nodule: Secondary | ICD-10-CM

## 2015-02-08 MED ORDER — BUDESONIDE-FORMOTEROL FUMARATE 160-4.5 MCG/ACT IN AERO: INHALATION_SPRAY | RESPIRATORY_TRACT | Status: AC

## 2015-02-08 MED ORDER — ALBUTEROL SULFATE HFA 108 (90 BASE) MCG/ACT IN AERS: 2.0000 | INHALATION_SPRAY | RESPIRATORY_TRACT | Status: DC | PRN

## 2015-02-08 NOTE — Assessment & Plan Note (Signed)
We discussed cessation today. Again we set a goal for her to cut down to one pack a day. She will work on this

## 2015-02-08 NOTE — Assessment & Plan Note (Signed)
We discussed her symptoms and possibly changing her bronchodilators to include LAMA. At this time we have decided to continue the Symbicort twice a day, ensure that she has albuterol to use when necessary.

## 2015-02-08 NOTE — Assessment & Plan Note (Signed)
She needs another repeat CT scan of the chest without contrast to follow-up pulmonary nodules for interval change

## 2015-02-08 NOTE — Progress Notes (Signed)
Subjective:    Patient ID: Amber Santiago, female    DOB: [birth year], 69 y.o.   MRN: [id number]  HPI 69 yo smoker (30 pk-yrs), hx of HTN, [facility] for COPD and for small pulm nodules seen on CT scan chest 03/10/13. Probably allergies, ? Hx asthma in the past. She saw Dr Doristine Johns for an AE-COPD +/- PNA, treated as an outpt. Was given O2 at that time.  She is active, is able to exert but paces herself. Wheezes occasionally.   ROV 06/16/13 -- follows up for dyspnea and smoking. PFT today > severe AFL, borderline BD response. Hyperinflated volumes, decreased DLCO that corrects for Va. She admits today that her breathing has declined over the years.   ROV 07/17/13 -- follows for COPD. Last time we started Spiriva to see if she would benefit. She stopped it after 11 days due to cough and the fact that it had not helped. She has been doing well. She remains on loratadine. She does complain of occasional dyspnea.   ROV 12/03/13 -- follow up visit for COPD. She started symbicort last time. Her breathing has improved significantly. She is on loratadine.  Will need to get her CT chest in Oct or Nov (she will likely be in [place] in December).   ROV 06/25/14 -- follow up today for COPD and pulmonary nodules. She has been doing quite well since last time. She has been taking her symbicort reliably. In the past she has had some episodes of panic, these seem to be happening less frequently. She has not had any flares. Hasn't needed her SABA. She is smoking over a pack a day.   ROV 02/08/15 -- follow-up visit for COPD, continued tobacco use (1+ pk a day), pulmonary nodules that a been stable on serial CT scans, most recent was in October 2015.  She has been dealing with significant eye disease, receiving injections and treatments with opthalmology.  She has also been through a lot of stress from moving to a new house, then moved back to the original house.  She states that her breathing has been  pretty good, there is some association with anxiety.  She feels a heaviness in her chest, has been present for a few days, frequent cough non-productive.  She is on symbicort bid.     No flowsheet data found.   Review of Systems  Constitutional: Negative for fever and unexpected weight change.  HENT: Positive for sinus pressure. Negative for congestion, dental problem, ear pain, nosebleeds, postnasal drip, rhinorrhea, sneezing, sore throat and trouble swallowing.   Eyes: Negative for redness and itching.  Respiratory: Positive for cough and shortness of breath. Negative for chest tightness and wheezing.   Cardiovascular: Negative for palpitations and leg swelling.  Gastrointestinal: Negative for nausea and vomiting.  Genitourinary: Negative for dysuria.  Musculoskeletal: Negative for joint swelling.  Skin: Negative for rash.  Neurological: Negative for headaches.  Hematological: Does not bruise/bleed easily.  Psychiatric/Behavioral: The patient is nervous/anxious.       Objective:   Physical Exam Filed Vitals:   02/08/15 1637  BP: 120/84  Pulse: 56  SpO2: 96%   Gen: Pleasant, well-nourished, in no distress,  normal affect  ENT: No lesions,  mouth clear,  oropharynx clear, no postnasal drip  Neck: No JVD, no TMG, no carotid bruits  Lungs: No use of accessory muscles, clear without rales or rhonchi  Cardiovascular: RRR, heart sounds normal, no murmur or gallops, no peripheral edema  Musculoskeletal: No deformities, no cyanosis or clubbing  Neuro: alert, non focal  Skin: Warm, no lesions or rashes   03/10/13 --  FINDINGS:  On lung window images, there is significant biapical pleural and  parenchymal scarring present. Within the left upper lobe there is a  4 mm noncalcified nodule anteriorly on image number 18 and a 4 mm  noncalcified nodule more inferiorly and anteriorly on image number  21. There is a peripheral noncalcified nodule in the right upper  lobe subpleural  in location of 5 mm in diameter, and a calcified  granuloma is noted posteriorly within the right upper lobe near the  apex. A 3 mm noncalcified nodule is noted in the right middle lobe  on image number 36. These findings may all be due to prior  granulomatous disease, but a followup CT of the chest in 3-4 months  is recommended to evaluate stability. No lung infiltrate is seen and  there is no evidence of pleural effusion. There is scarring noted  within the lingula. Mild paraseptal emphysema is present. The  central airway is patent.  On soft tissue window images, the thyroid gland is normal in size.  The thoracic aorta opacifies with no acute abnormality and the  origins of the great vessels appear patent. The pulmonary arteries  are well opacified with no acute abnormality noted. Only a few small  mediastinal lymph nodes are present none of which appear  pathologically enlarged. Faint coronary artery calcification is  present within the distribution of the left anterior descending  artery. No definite abnormality of the upper abdomen is visualized.  Direct correlation with the outside chest x-ray would be helpful to  determine the etiology of the abnormality questioned on outside  chest x-ray.  IMPRESSION:  1. Small lung nodules at least one of which is calcified suggesting  a possible granulomatous origin. However a followup CT of the chest  is recommended in 3-4 months to assess stability.  2. No calcified mass is noted as questioned on outside chest x-ray.  Direct correlation with that outside chest x-ray would be helpful.  3. Mild paraseptal emphysema.      Assessment & Plan:  Tobacco use disorder We discussed cessation today. Again we set a goal for her to cut down to one pack a day. She will work on this  Pulmonary nodules She needs another repeat CT scan of the chest without contrast to follow-up pulmonary nodules for interval change  COPD (chronic obstructive  pulmonary disease) We discussed her symptoms and possibly changing her bronchodilators to include LAMA. At this time we have decided to continue the Symbicort twice a day, ensure that she has albuterol to use when necessary.

## 2015-02-08 NOTE — Patient Instructions (Signed)
We will

## 2015-02-14 ENCOUNTER — Encounter (INDEPENDENT_AMBULATORY_CARE_PROVIDER_SITE_OTHER): Payer: Managed Care, Other (non HMO) | Admitting: Ophthalmology

## 2015-02-14 DIAGNOSIS — H353231 Exudative age-related macular degeneration, bilateral, with active choroidal neovascularization: Secondary | ICD-10-CM | POA: Diagnosis not present

## 2015-02-14 DIAGNOSIS — H43813 Vitreous degeneration, bilateral: Secondary | ICD-10-CM | POA: Diagnosis not present

## 2015-02-14 DIAGNOSIS — H2513 Age-related nuclear cataract, bilateral: Secondary | ICD-10-CM

## 2015-02-15 ENCOUNTER — Ambulatory Visit (INDEPENDENT_AMBULATORY_CARE_PROVIDER_SITE_OTHER)
Admission: RE | Admit: 2015-02-15 | Discharge: 2015-02-15 | Disposition: A | Discharge status: Home / Self Care | Payer: Managed Care, Other (non HMO) | Source: Ambulatory Visit | Attending: Emergency Medicine | Admitting: Emergency Medicine

## 2015-02-15 DIAGNOSIS — R911 Solitary pulmonary nodule: Secondary | ICD-10-CM | POA: Diagnosis not present

## 2015-02-16 ENCOUNTER — Telehealth: Payer: Self-pay | Admitting: *Deleted

## 2015-02-16 DIAGNOSIS — R918 Other nonspecific abnormal finding of lung field: Secondary | ICD-10-CM

## 2015-02-16 NOTE — Telephone Encounter (Signed)
Amber Santiago from [facility] called regarding pt CT scan in EPIC.  IMPRESSION: 1. 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 Probable enlarged right hilar lymph nodes but difficult to be certain without contrast. Recommend PET-CT for further evaluation and staging. 2. Stable small scattered sub 4 mm pulmonary nodules, likely benign. 3. Stable dense apical pleural and parenchymal scarring changes. 4. Stable emphysematous changes. 5. No significant bony findings. These results will be called to the ordering clinician or representative by the Radiologist Assistant, and communication documented in the PACS or zVision Dashboard ---  RB on vacation. Please advise Dr. Lake Bells thanks

## 2015-02-16 NOTE — Telephone Encounter (Signed)
I've reviewed the chart.  We should have the folks from CT make a super-D disk and send it over for Rob.  Also order a PET CT, reason, lung mass.   I tried to call the patient to discuss this, but she did not answer.

## 2015-02-16 NOTE — Telephone Encounter (Signed)
Called [facility] and asked them to make a Super D Disk.  Also, ordered PET CT per Dr. Anastasia Pall request.  Per Dr. Lake Bells, he attempted to contact patient to discuss results.   Awaiting call back from patient to discuss results with provider.

## 2015-02-17 NOTE — Telephone Encounter (Signed)
Patient returned call, may be reached at [phone]

## 2015-02-17 NOTE — Telephone Encounter (Signed)
Spoke with pt. Advised her that BQ would give her a call back.

## 2015-02-17 NOTE — Telephone Encounter (Signed)
Pt calling to speak to nurse again.Amber Santiago

## 2015-02-17 NOTE — Telephone Encounter (Signed)
lmomtcb x2 for pt 

## 2015-02-17 NOTE — Telephone Encounter (Signed)
I called her to go over the results of the CT chest including the 4x3cm mass in the RUL.  I am concerned that this may be malignant and explained to her that she needs to have a PET CT and may need to have a bronchoscopy.  Please make sure that the PET CT results are forwarded to Dr. Lamonte Sakai as I am on night float next week.

## 2015-02-17 NOTE — Telephone Encounter (Signed)
Called spoke with pt. She is wanting to speak with Dr. Lake Bells regarding her results below.  Please advise Dr. Lake Bells thanks

## 2015-02-18 NOTE — Telephone Encounter (Signed)
Sending to Dr Lamonte Sakai as a FYI  Nothing further is needed at this time

## 2015-02-21 ENCOUNTER — Telehealth: Payer: Self-pay | Admitting: Emergency Medicine

## 2015-02-21 NOTE — Telephone Encounter (Signed)
Called and spoke to pt. Pt stated she was informed last week about her CT results and that BQ is recommending a PET scan, pt is questioning if this is needed. Advised pt of the recs and concerns of Dr. Anastasia Pall. Advised pt this would not have been ordered if it were not neccessary. Pt verbalized understanding and stated she would keep her appt for PET scan. Nothing further needed at this time.     Juanito Doom, MD at 02/17/2015 5:31 PM     Status: Signed       Expand All Collapse All   I called her to go over the results of the CT chest including the 4x3cm mass in the RUL. I am concerned that this may be malignant and explained to her that she needs to have a PET CT and may need to have a bronchoscopy. Please make sure that the PET CT results are forwarded to Dr. Lamonte Sakai as I am on night float next week.

## 2015-02-21 NOTE — Telephone Encounter (Signed)
Spoke with pt. Her line went dead in the middle of speaking with her. Attempted to call her back and received busy signal. Will call her back later.

## 2015-02-21 NOTE — Telephone Encounter (Signed)
[phone] pt calling back

## 2015-02-25 ENCOUNTER — Ambulatory Visit (HOSPITAL_COMMUNITY)
Admission: RE | Admit: 2015-02-25 | Discharge: 2015-02-25 | Disposition: A | Discharge status: Home / Self Care | Payer: Managed Care, Other (non HMO) | Source: Ambulatory Visit | Attending: Pulmonary Disease | Admitting: Pulmonary Disease

## 2015-02-25 DIAGNOSIS — Z0189 Encounter for other specified special examinations: Secondary | ICD-10-CM | POA: Diagnosis present

## 2015-02-25 DIAGNOSIS — R918 Other nonspecific abnormal finding of lung field: Secondary | ICD-10-CM | POA: Insufficient documentation

## 2015-02-25 DIAGNOSIS — I700 Atherosclerosis of aorta: Secondary | ICD-10-CM | POA: Diagnosis not present

## 2015-02-25 LAB — GLUCOSE, CAPILLARY: Glucose-Capillary: 99 mg/dL (ref 65–99)

## 2015-02-25 MED ORDER — FLUDEOXYGLUCOSE F - 18 (FDG) INJECTION
8.2600 | Freq: Once | INTRAVENOUS | Status: DC | PRN
  Administered 2015-02-25: 8.26 via INTRAVENOUS
  Filled 2015-02-25: qty 8.26

## 2015-03-07 ENCOUNTER — Telehealth: Payer: Self-pay | Admitting: Emergency Medicine

## 2015-03-07 NOTE — Telephone Encounter (Signed)
Called to discuss PET results with her today. She tells me that she has been sick since the PET, flu like sx, was seen at urgent care and is currently being treated with steroids, may be a bit better but still having sx, SOB at night. ? A component of UA obstruction.  The PET shows a hypermetabolic medial RUL mass, probably malignancy. She will need either standard FOB vs ENB. I believe standard FOB would be preferred given her FEV1 and risk for general anesthesia. We can work on setting this up 11/29.

## 2015-03-07 NOTE — Telephone Encounter (Signed)
PET was order by BQ on 11/18 but this is RB pt. Pt requesting PET scan results. Please advise RB thanks

## 2015-03-08 NOTE — Telephone Encounter (Signed)
Spoke with Respiratory. FOB has been scheduled for 03/14/2015 at 7:30am. Pt is aware of this information. Will verbal tell RB when this is scheduled for.

## 2015-03-09 ENCOUNTER — Telehealth: Payer: Self-pay | Admitting: Emergency Medicine

## 2015-03-09 NOTE — Telephone Encounter (Signed)
Called and spoke to pt's husband. Pt's husband questioned when they should arrive for bronch and when she would leave and also if pt was diagnosed with COPD. Advised pt's husband of the times and location of where to be and the approximate time they would leave and informed him that RB did use a diagnosis of COPD during pt's last visit. Pt's husband verbalized understanding and denied any further questions or concerns at this time.

## 2015-03-10 ENCOUNTER — Telehealth: Payer: Self-pay | Admitting: Emergency Medicine

## 2015-03-10 NOTE — Telephone Encounter (Signed)
Called and spoke with patient. Informed her of new bronch date and time. Patient voiced understanding and had no further questions. Nothing further needed. Will sign off on message.

## 2015-03-10 NOTE — Telephone Encounter (Signed)
After speaking to [place], pt's bronch date and time will need to be changed. She was scheduled for 03/14/2015 at 7:30am at [facility]. I have called respiratory and her bronch has been moved to 03/18/2015 at 7:30am at [facility]. lmtcb x1 for pt.

## 2015-03-14 ENCOUNTER — Encounter (HOSPITAL_COMMUNITY): Admission: RE | Payer: Self-pay | Source: Ambulatory Visit

## 2015-03-14 ENCOUNTER — Encounter (HOSPITAL_COMMUNITY): Payer: Managed Care, Other (non HMO)

## 2015-03-14 ENCOUNTER — Encounter (INDEPENDENT_AMBULATORY_CARE_PROVIDER_SITE_OTHER): Payer: Managed Care, Other (non HMO) | Admitting: Ophthalmology

## 2015-03-14 ENCOUNTER — Ambulatory Visit (HOSPITAL_COMMUNITY)
Admission: RE | Admit: 2015-03-14 | Payer: Managed Care, Other (non HMO) | Source: Ambulatory Visit | Admitting: Emergency Medicine

## 2015-03-14 SURGERY — BRONCHOSCOPY, WITH FLUOROSCOPY
Anesthesia: Moderate Sedation | Laterality: Bilateral

## 2015-03-18 ENCOUNTER — Ambulatory Visit (HOSPITAL_COMMUNITY)
Admission: RE | Admit: 2015-03-18 | Discharge: 2015-03-18 | Disposition: A | Discharge status: Home / Self Care | Payer: Managed Care, Other (non HMO) | Source: Ambulatory Visit | Attending: Emergency Medicine | Admitting: Emergency Medicine

## 2015-03-18 ENCOUNTER — Ambulatory Visit (HOSPITAL_COMMUNITY): Payer: Managed Care, Other (non HMO)

## 2015-03-18 ENCOUNTER — Encounter (HOSPITAL_COMMUNITY)
Admission: RE | Disposition: A | Discharge status: Home / Self Care | Payer: Self-pay | Source: Ambulatory Visit | Attending: Emergency Medicine

## 2015-03-18 ENCOUNTER — Encounter (HOSPITAL_COMMUNITY): Payer: Self-pay | Admitting: Radiology

## 2015-03-18 DIAGNOSIS — C3411 Malignant neoplasm of upper lobe, right bronchus or lung: Secondary | ICD-10-CM | POA: Diagnosis not present

## 2015-03-18 DIAGNOSIS — R918 Other nonspecific abnormal finding of lung field: Secondary | ICD-10-CM | POA: Diagnosis present

## 2015-03-18 DIAGNOSIS — C3490 Malignant neoplasm of unspecified part of unspecified bronchus or lung: Secondary | ICD-10-CM | POA: Diagnosis present

## 2015-03-18 DIAGNOSIS — Z9889 Other specified postprocedural states: Secondary | ICD-10-CM

## 2015-03-18 HISTORY — PX: VIDEO BRONCHOSCOPY: SHX5072

## 2015-03-18 SURGERY — BRONCHOSCOPY, WITH FLUOROSCOPY
Anesthesia: Moderate Sedation | Laterality: Bilateral

## 2015-03-18 MED ORDER — MIDAZOLAM HCL 10 MG/2ML IJ SOLN
INTRAMUSCULAR | Status: DC | PRN
  Administered 2015-03-18 (×2): 1 mg via INTRAVENOUS
  Administered 2015-03-18: 3 mg via INTRAVENOUS

## 2015-03-18 MED ORDER — FENTANYL CITRATE (PF) 100 MCG/2ML IJ SOLN
INTRAMUSCULAR | Status: AC
  Filled 2015-03-18: qty 4

## 2015-03-18 MED ORDER — FENTANYL CITRATE (PF) 100 MCG/2ML IJ SOLN
INTRAMUSCULAR | Status: DC | PRN
  Administered 2015-03-18 (×2): 25 ug via INTRAVENOUS
  Administered 2015-03-18: 50 ug via INTRAVENOUS
  Administered 2015-03-18: 25 ug via INTRAVENOUS

## 2015-03-18 MED ORDER — LIDOCAINE HCL 2 % EX GEL
CUTANEOUS | Status: DC | PRN
  Administered 2015-03-18: 1

## 2015-03-18 MED ORDER — MIDAZOLAM HCL 5 MG/ML IJ SOLN
INTRAMUSCULAR | Status: AC
  Filled 2015-03-18: qty 2

## 2015-03-18 MED ORDER — PHENYLEPHRINE HCL 0.25 % NA SOLN
NASAL | Status: DC | PRN
  Administered 2015-03-18: 2 via NASAL

## 2015-03-18 MED ORDER — LIDOCAINE-EPINEPHRINE (PF) 1 %-1:200000 IJ SOLN: INTRAMUSCULAR | Status: DC | PRN

## 2015-03-18 MED ORDER — SODIUM CHLORIDE 0.9 % IV SOLN
INTRAVENOUS | Status: DC
  Administered 2015-03-18: 08:00:00 via INTRAVENOUS

## 2015-03-18 MED ORDER — LIDOCAINE HCL (PF) 1 % IJ SOLN
INTRAMUSCULAR | Status: DC | PRN
  Administered 2015-03-18: 6 mL

## 2015-03-18 NOTE — Progress Notes (Signed)
Video bronchoscopy with intervention brushing, intervention washing, intervention biopsies, intervention Wang needle. All vital good throughout. Report given to endo RN as to meds and specimens. Pt tolerated well, no complications noted.

## 2015-03-18 NOTE — Op Note (Signed)
Video Bronchoscopy Procedure Note  Date of Operation: 03/18/2015  Pre-op Diagnosis: RUL mass  Post-op Diagnosis: Same  Surgeon: Baltazar Apo  Assistants: none  Anesthesia: conscious sedation, moderate sedation  Meds Given: fentanyl 157mg, versed '5mg'$  in divided doses, 1% lidocaine 30cc total  Operation: Flexible video fiberoptic bronchoscopy and biopsies.  Estimated Blood Loss: 203XY Complications: none noted  Indications and History: CLuara Fayeis 70y.o. with history of tobacco use, COPD and a RUL mass found to he hypermetabolic on PET scan.  Recommendation was to perform video fiberoptic bronchoscopy with biopsies. The risks, benefits, complications, treatment options and expected outcomes were discussed with the patient.  The possibilities of pneumothorax, pneumonia, reaction to medication, pulmonary aspiration, perforation of a viscus, bleeding, failure to diagnose a condition and creating a complication requiring transfusion or operation were discussed with the patient who freely signed the consent.    Description of Procedure: The patient was seen in the Preoperative Area, was examined and was deemed appropriate to proceed.  [facility] the procedure verified as Flexible Video Fiberoptic Bronchoscopy.  A Time Out was held and the above information confirmed.   Conscious sedation was initiated as indicated above. The video fiberoptic bronchoscope was introduced via the R nare and a general inspection was performed which showed normal cords, normal trachea, normal main carina. The patient desaturated transiently with sedation to 80's and a partial NRB mask was placed to supplement. SpO2 improved to high 90's and the case was continued. The R sided airways were inspected and showed normal RUL, BI, RML and RLL. The L side was then inspected. The LLL, Lingular and LUL airways were normal. There were no endobronchial lesions.  Wang needle biopsies were performed in the RUL at the carina between the anterior and posterior segments. Under fluoroscopic guidance RUL brushings and biopsies were performed for cytology and pathology. There was some initial moderate bleeding that stopped quickly. Finally endobronchial washings were performed in the RUL to be sent for cytology. There was some moderate coughing. The patient began to have elevated BP near the end of the case and, given the concern for potential respiratory suppression with more sedation, the case was stopped at that time after a few transbronchial biopsies could be collected.  The patient tolerated the procedure well. The bronchoscope was removed. There were no obvious complications.   Samples: 1. Transwronchial brushings from RUL 2. Wang needle biopsies from RUL bronchus at carina between the anterior and posterior segments.  3. Transbronchial forceps biopsies from RUL 4. Bronchial washings from RUL  Plans:  We will review the cytology, pathology results with the patient when they become available.  Outpatient followup will be with Dr BLamonte Sakai    RBaltazar Apo MD, PhD 03/18/2015, 8:57 AM [facility] [phone] if no answer [phone]

## 2015-03-18 NOTE — Discharge Instructions (Signed)
Flexible Bronchoscopy, Care After These instructions give you information on caring for yourself after your procedure. Your doctor may also give you more specific instructions. Call your doctor if you have any problems or questions after your procedure. HOME CARE  Do not eat or drink anything for 2 hours after your procedure. If you try to eat or drink before the medicine wears off, food or drink could go into your lungs. You could also burn yourself.  After 2 hours have passed and when you can cough and gag normally, you may eat soft food and drink liquids slowly.  The day after the test, you may eat your normal diet.  You may do your normal activities.  Keep all doctor visits. GET HELP RIGHT AWAY IF:  You get more and more short of breath.  You get light-headed.  You feel like you are going to pass out (faint).  You have chest pain.  You have new problems that worry you.  You cough up more than a little blood.  You cough up more blood than before. MAKE SURE YOU:  Understand these instructions.  Will watch your condition.  Will get help right away if you are not doing well or get worse.   This information is not intended to replace advice given to you by your health care provider. Make sure you discuss any questions you have with your health care provider.   Document Released: 01/21/2009 Document Revised: 03/31/2013 Document Reviewed: 11/28/2012 Elsevier Interactive Patient Education [employer].  Please call our office for any problems or concerns. [phone]

## 2015-03-21 ENCOUNTER — Encounter (HOSPITAL_COMMUNITY): Payer: Self-pay | Admitting: Emergency Medicine

## 2015-03-23 ENCOUNTER — Encounter (INDEPENDENT_AMBULATORY_CARE_PROVIDER_SITE_OTHER): Payer: Managed Care, Other (non HMO) | Admitting: Ophthalmology

## 2015-03-23 ENCOUNTER — Telehealth: Payer: Self-pay | Admitting: Emergency Medicine

## 2015-03-23 DIAGNOSIS — H353231 Exudative age-related macular degeneration, bilateral, with active choroidal neovascularization: Secondary | ICD-10-CM | POA: Diagnosis not present

## 2015-03-23 DIAGNOSIS — C3491 Malignant neoplasm of unspecified part of right bronchus or lung: Secondary | ICD-10-CM

## 2015-03-23 DIAGNOSIS — H2513 Age-related nuclear cataract, bilateral: Secondary | ICD-10-CM | POA: Diagnosis not present

## 2015-03-23 DIAGNOSIS — H43813 Vitreous degeneration, bilateral: Secondary | ICD-10-CM | POA: Diagnosis not present

## 2015-03-23 NOTE — Telephone Encounter (Signed)
Will route message back to RB to call pt back.

## 2015-03-23 NOTE — Telephone Encounter (Signed)
Pt calling back [phone]

## 2015-03-23 NOTE — Telephone Encounter (Signed)
Called patient to discuss bx results. No answer, left message that I would try her again.

## 2015-03-24 ENCOUNTER — Encounter (INDEPENDENT_AMBULATORY_CARE_PROVIDER_SITE_OTHER): Payer: Managed Care, Other (non HMO) | Admitting: Ophthalmology

## 2015-03-24 DIAGNOSIS — S0510XA Contusion of eyeball and orbital tissues, unspecified eye, initial encounter: Secondary | ICD-10-CM

## 2015-03-24 NOTE — Telephone Encounter (Signed)
[phone] please call pt really nervous wants results

## 2015-03-25 ENCOUNTER — Telehealth: Payer: Self-pay | Admitting: *Deleted

## 2015-03-25 DIAGNOSIS — R918 Other nonspecific abnormal finding of lung field: Secondary | ICD-10-CM

## 2015-03-25 NOTE — Telephone Encounter (Signed)
Spoke with Amber Santiago, reviewed bx results with her. She is understandably upset. We will refer her to [facility] in [place]. She has OV with me next week, will keep this appt to allow her family to come adn ask questions.

## 2015-03-25 NOTE — Telephone Encounter (Signed)
[phone], pt husband cb for results

## 2015-03-25 NOTE — Telephone Encounter (Signed)
Oncology Nurse Navigator Documentation  Oncology Nurse Navigator Flowsheets 03/25/2015  Referral date to RadOnc/MedOnc 03/25/2015  Navigator Encounter Type Introductory phone call/ I received a referral today on Ms. Santellan.  I called to schedule for Rhea on 03/31/15.  I left vm message with appt 03/31/15 arrive at 1:30.  I also left my name and phone number.    Patient Visit Type Initial  Treatment Phase Abnormal Scans  Interventions Coordination of Care  Coordination of Care MD Appointments  Time Spent with Patient 15

## 2015-03-25 NOTE — Telephone Encounter (Signed)
RB called into triage. He has tried calling pt again and did not get an answer. He wants me to send this message back over to him

## 2015-03-25 NOTE — Telephone Encounter (Signed)
Called patient and husband's cell this am, unable to reach. Left messages and will try them back.

## 2015-03-25 NOTE — Telephone Encounter (Signed)
Called spoke with spouse. They are wanting the DOD to address her BX results with her as RB is NA today as he worked Immunologist. Please advise MW thanks

## 2015-03-29 ENCOUNTER — Encounter: Payer: Self-pay | Admitting: Emergency Medicine

## 2015-03-29 ENCOUNTER — Ambulatory Visit (INDEPENDENT_AMBULATORY_CARE_PROVIDER_SITE_OTHER): Payer: Managed Care, Other (non HMO) | Admitting: Emergency Medicine

## 2015-03-29 VITALS — BP 130/86 | HR 74 | Wt 136.0 lb

## 2015-03-29 DIAGNOSIS — J449 Chronic obstructive pulmonary disease, unspecified: Secondary | ICD-10-CM

## 2015-03-29 DIAGNOSIS — C3491 Malignant neoplasm of unspecified part of right bronchus or lung: Secondary | ICD-10-CM

## 2015-03-29 NOTE — Assessment & Plan Note (Signed)
Continue same BD regimen

## 2015-03-29 NOTE — Assessment & Plan Note (Signed)
Probably stage IIb or IIIa disease, but she will not be a surgical candidate due to her COPD, tobacco use. Will refer her to [facility] to discuss options for therapy.

## 2015-03-29 NOTE — Patient Instructions (Signed)
Please attend the thoracic oncology clinic on Thursday as planned.  Call our office if you have any questions regarding that appointment Follow with Dr Lamonte Sakai in 3 months or sooner if you have any problems.

## 2015-03-29 NOTE — Progress Notes (Signed)
Subjective:    Patient ID: Amber Santiago, female    DOB: [birth year], 69 y.o.   MRN: [id number]  HPI 69 yo smoker (30 pk-yrs), hx of HTN, referred by Dr Doristine Johns at Urgent Care for COPD and for small pulm nodules seen on CT scan chest 03/10/13. Probably allergies, ? Hx asthma in the past. She saw Dr Doristine Johns for an AE-COPD +/- PNA, treated as an outpt. Was given O2 at that time.  She is active, is able to exert but paces herself. Wheezes occasionally.   ROV 06/16/13 -- follows up for dyspnea and smoking. PFT today > severe AFL, borderline BD response. Hyperinflated volumes, decreased DLCO that corrects for Va. She admits today that her breathing has declined over the years.   ROV 07/17/13 -- follows for COPD. Last time we started Spiriva to see if she would benefit. She stopped it after 11 days due to cough and the fact that it had not helped. She has been doing well. She remains on loratadine. She does complain of occasional dyspnea.   ROV 12/03/13 -- follow up visit for COPD. She started symbicort last time. Her breathing has improved significantly. She is on loratadine.  Will need to get her CT chest in Oct or Nov (she will likely be in [place] in December).   ROV 06/25/14 -- follow up today for COPD and pulmonary nodules. She has been doing quite well since last time. She has been taking her symbicort reliably. In the past she has had some episodes of panic, these seem to be happening less frequently. She has not had any flares. Hasn't needed her SABA. She is smoking over a pack a day.   ROV 02/08/15 -- follow-up visit for COPD, continued tobacco use (1+ pk a day), pulmonary nodules that a been stable on serial CT scans, most recent was in October 2015.  She has been dealing with significant eye disease, receiving injections and treatments with opthalmology.  She has also been through a lot of stress from moving to a new house, then moved back to the original house.  She states that her breathing has been  pretty good, there is some association with anxiety.  She feels a heaviness in her chest, has been present for a few days, frequent cough non-productive.  She is on symbicort bid.    ROV 03/29/15 -- follows up for COPD, tobacco use. 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 She returns to discuss status and dx. Her daughter and husband are here. Many questions regarding the dx, the prognosis, options for therapy, etc. Answered these to the best of my ability. Also completed the FMLA forms for [name]   80% of the 30 minute visit was for questions and counseling regarding the dx of lung cancer.    No flowsheet data found.   Review of Systems  Constitutional: Negative for fever and unexpected weight change.  HENT: Positive for sinus pressure. Negative for congestion, dental problem, ear pain, nosebleeds, postnasal drip, rhinorrhea, sneezing, sore throat and trouble swallowing.   Eyes: Negative for redness and itching.  Respiratory: Positive for cough and shortness of breath. Negative for chest tightness and wheezing.   Cardiovascular: Negative for palpitations and leg swelling.  Gastrointestinal: Negative for nausea and vomiting.  Genitourinary: Negative for dysuria.  Musculoskeletal: Negative for joint swelling.  Skin: Negative for rash.  Neurological: Negative for headaches.  Hematological: Does not bruise/bleed easily.  Psychiatric/Behavioral: The patient is nervous/anxious.  Objective:   Physical Exam Filed Vitals:   03/29/15 1607 03/29/15 1608  BP:  130/86  Pulse:  74  Weight: 136 lb (61.689 kg)   SpO2:  98%   Gen: Pleasant, well-nourished, in no distress,  normal affect  ENT: No lesions,  mouth clear,  oropharynx clear, no postnasal drip  Neck: No JVD, no TMG, no carotid bruits  Lungs: No use of accessory muscles, clear without rales or rhonchi  Cardiovascular: RRR, heart sounds normal, no murmur or gallops, no peripheral  edema  Musculoskeletal: No deformities, no cyanosis or clubbing  Neuro: alert, non focal  Skin: Warm, no lesions or rashes   03/10/13 --  FINDINGS:  On lung window images, there is significant biapical pleural and  parenchymal scarring present. Within the left upper lobe there is a  4 mm noncalcified nodule anteriorly on image number 18 and a 4 mm  noncalcified nodule more inferiorly and anteriorly on image number  21. There is a peripheral noncalcified nodule in the right upper  lobe subpleural in location of 5 mm in diameter, and a calcified  granuloma is noted posteriorly within the right upper lobe near the  apex. A 3 mm noncalcified nodule is noted in the right middle lobe  on image number 36. These findings may all be due to prior  granulomatous disease, but a followup CT of the chest in 3-4 months  is recommended to evaluate stability. No lung infiltrate is seen and  there is no evidence of pleural effusion. There is scarring noted  within the lingula. Mild paraseptal emphysema is present. The  central airway is patent.  On soft tissue window images, the thyroid gland is normal in size.  The thoracic aorta opacifies with no acute abnormality and the  origins of the great vessels appear patent. The pulmonary arteries  are well opacified with no acute abnormality noted. Only a few small  mediastinal lymph nodes are present none of which appear  pathologically enlarged. Faint coronary artery calcification is  present within the distribution of the left anterior descending  artery. No definite abnormality of the upper abdomen is visualized.  Direct correlation with the outside chest x-ray would be helpful to  determine the etiology of the abnormality questioned on outside  chest x-ray.  IMPRESSION:  1. Small lung nodules at least one of which is calcified suggesting  a possible granulomatous origin. However a followup CT of the chest  is recommended in 3-4 months to assess  stability.  2. No calcified mass is noted as questioned on outside chest x-ray.  Direct correlation with that outside chest x-ray would be helpful.  3. Mild paraseptal emphysema.      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 Will refer her to [facility] to discuss options for therapy.   COPD (chronic obstructive pulmonary disease) Continue same BD regimen

## 2015-03-31 ENCOUNTER — Institutional Professional Consult (permissible substitution) (INDEPENDENT_AMBULATORY_CARE_PROVIDER_SITE_OTHER): Payer: Managed Care, Other (non HMO) | Admitting: Cardiothoracic Surgery

## 2015-03-31 ENCOUNTER — Encounter: Payer: Self-pay | Admitting: *Deleted

## 2015-03-31 ENCOUNTER — Other Ambulatory Visit (HOSPITAL_BASED_OUTPATIENT_CLINIC_OR_DEPARTMENT_OTHER): Payer: Managed Care, Other (non HMO)

## 2015-03-31 ENCOUNTER — Ambulatory Visit
Admission: RE | Admit: 2015-03-31 | Discharge: 2015-03-31 | Disposition: A | Discharge status: Home / Self Care | Payer: Managed Care, Other (non HMO) | Source: Ambulatory Visit | Attending: Radiation Oncology | Admitting: Radiation Oncology

## 2015-03-31 ENCOUNTER — Ambulatory Visit (HOSPITAL_BASED_OUTPATIENT_CLINIC_OR_DEPARTMENT_OTHER): Payer: Managed Care, Other (non HMO) | Admitting: Internal Medicine

## 2015-03-31 ENCOUNTER — Encounter: Payer: Self-pay | Admitting: Internal Medicine

## 2015-03-31 VITALS — BP 157/75 | HR 71 | Temp 98.0°F | Resp 18 | Ht 64.0 in | Wt 136.3 lb

## 2015-03-31 VITALS — BP 157/75 | HR 71 | Temp 98.0°F | Resp 18 | Wt 136.3 lb

## 2015-03-31 DIAGNOSIS — C3491 Malignant neoplasm of unspecified part of right bronchus or lung: Secondary | ICD-10-CM

## 2015-03-31 DIAGNOSIS — Z720 Tobacco use: Secondary | ICD-10-CM

## 2015-03-31 DIAGNOSIS — C3411 Malignant neoplasm of upper lobe, right bronchus or lung: Secondary | ICD-10-CM

## 2015-03-31 DIAGNOSIS — R590 Localized enlarged lymph nodes: Secondary | ICD-10-CM

## 2015-03-31 DIAGNOSIS — R918 Other nonspecific abnormal finding of lung field: Secondary | ICD-10-CM

## 2015-03-31 LAB — CBC WITH DIFFERENTIAL/PLATELET
BASO%: 1.2 % (ref 0.0–2.0)
BASOS ABS: 0.1 10*3/uL (ref 0.0–0.1)
EOS%: 0.7 % (ref 0.0–7.0)
Eosinophils Absolute: 0.1 10*3/uL (ref 0.0–0.5)
HEMATOCRIT: 41 % (ref 34.8–46.6)
HEMOGLOBIN: 13.8 g/dL (ref 11.6–15.9)
LYMPH#: 1.8 10*3/uL (ref 0.9–3.3)
LYMPH%: 20.7 % (ref 14.0–49.7)
MCH: 30 pg (ref 25.1–34.0)
MCHC: 33.7 g/dL (ref 31.5–36.0)
MCV: 89.1 fL (ref 79.5–101.0)
MONO#: 0.4 10*3/uL (ref 0.1–0.9)
MONO%: 4.9 % (ref 0.0–14.0)
NEUT%: 72.5 % (ref 38.4–76.8)
NEUTROS ABS: 6.2 10*3/uL (ref 1.5–6.5)
Platelets: 334 10*3/uL (ref 145–400)
RBC: 4.6 10*6/uL (ref 3.70–5.45)
RDW: 13.8 % (ref 11.2–14.5)
WBC: 8.5 10*3/uL (ref 3.9–10.3)

## 2015-03-31 LAB — COMPREHENSIVE METABOLIC PANEL
ALBUMIN: 3.6 g/dL (ref 3.5–5.0)
ALK PHOS: 87 U/L (ref 40–150)
ALT: 19 U/L (ref 0–55)
AST: 37 U/L — AB (ref 5–34)
Anion Gap: 9 mEq/L (ref 3–11)
BUN: 8.1 mg/dL (ref 7.0–26.0)
CALCIUM: 9.2 mg/dL (ref 8.4–10.4)
CO2: 27 mEq/L (ref 22–29)
CREATININE: 0.7 mg/dL (ref 0.6–1.1)
Chloride: 98 mEq/L (ref 98–109)
EGFR: 82 mL/min/{1.73_m2} — ABNORMAL LOW (ref >90)
GLUCOSE: 123 mg/dL (ref 70–140)
POTASSIUM: 4.1 meq/L (ref 3.5–5.1)
SODIUM: 134 meq/L — AB (ref 136–145)
Total Bilirubin: 0.4 mg/dL (ref 0.20–1.20)
Total Protein: 7 g/dL (ref 6.4–8.3)

## 2015-03-31 NOTE — Progress Notes (Signed)
[place] Clinical Social Work  Clinical Social Work met with patient/family and Futures trader at [facility] appointment to offer support and assess for psychosocial needs.  Medical oncologist reviewed patient's diagnosis and recommended treatment plan with patient/family.   The patient scored a 9 prior to meeting with medical oncologist on the Psychosocial Distress Thermometer which indicates severe distress. Clinical Social Worker met with patient after medical oncologist to assess for distress and other psychosocial needs. Mrs. Stuckert was accompanied by her spouse and daughter Jonelle Sidle.  She shared she feels her distress is about a "4" now after receiving information from Dr. Julien Nordmann.    ONCBCN DISTRESS SCREENING 03/31/2015  Screening Type Initial Screening  Distress experienced in past week (1-10) 9  Emotional problem type Nervousness/Anxiety;Adjusting to illness;Feeling hopeless  Information Concerns Type Lack of info about diagnosis;Lack of info about treatment;Lack of info about complementary therapy choices;Lack of info about maintaining fitness  Physical Problem type Sleep/insomnia;Loss of appetitie;Constipation/diarrhea;Skin dry/itchy;Swollen arms/legs  Referral to clinical social work Yes  Referral to support programs Yes    Clinical Social Work briefly discussed Clinical Social Work role and [employer] support programs/services.  Mrs. Leclere was interested in integrative care programs such as yoga/taichi and massage therapy.  She also plans to contact CSW for counseling if she continues to feel anxious once she begins treatment.  Clinical Social Work encouraged patient to call with any additional questions or concerns.   Polo Riley, MSW, LCSW, OSW-C Clinical Social Worker [facility] [phone]

## 2015-03-31 NOTE — Progress Notes (Signed)
Oncology Nurse Navigator Documentation  Oncology Nurse Navigator Flowsheets 03/31/2015  Navigator Encounter Type Clinic/MDC/spoke with patient and family today at [facility].  Treatment plan was discussion with Med Onc, Rad Onc, and surgery.  I gave information on diagnosis and next steps.   Patient Visit Type Initial  Treatment Phase Other  Barriers/Navigation Needs Education  Education Understanding Cancer/ Treatment Options;Newly Diagnosed Cancer Education  Interventions Education Method  Education Method Written;Verbal  Time Spent with Patient 30         Thoracic Treatment Summary Name:Amber Santiago Date:03/31/2015 DOB:Apr 23, 1945 Your Medical Team Medical Oncologist:Dr. Julien Nordmann Radiation Oncologist:Dr. Sondra Come Pulmonologist:  Surgeon:Dr.Gerhardt Type and Stage of Lung Cancer Non-Small Cell Carcinoma: Squamous Cell  Clinical Stage:  Stage IIIB  Clinical stage is based on radiology exams.  Pathological stage will be determined after surgery.  Staging is based on the size of the tumor, involvement of lymph nodes or not, and whether or not the cancer center has spread. Recommendations Recommendations: More tissue dx, chemotherapy and radiation  These recommendations are based on information available as of today's consult.  This is subject to change depending further testing or exams. Next Steps Next Step: Surgery will set up follow up appointments.  [facility] office will call with appointment [phone] Medical Oncology will set up follow up appointments [phone] Radiation Oncology will set up follow up appointments [phone] Barriers to Care What do you perceive as a potential barrier that may prevent you from receiving your treatment plan? Education Training and development officer  Resources Given: Fall Risk Inforamtion [employer] booklet on lung cancer Research scientist (life sciences) at [employer].Radonna Ricker [phone]  Financial Advocate  information   Questions Norton Blizzard, RN BSN Thoracic Oncology Nurse Navigator at [place] is a nurse navigator that is available to assist you through your cancer journey.  She can answer your questions and/or provide resources regarding your treatment plan, emotional support, or financial concerns.

## 2015-03-31 NOTE — Progress Notes (Signed)
[address]       [place] [address]             [phone]                    [name] Medical Record #[id number] Date of Birth: May 02, 1945  Referring: Curt Bears, MD Primary Care: Pcp Not In System  Chief Complaint:   No chief complaint on file.   History of Present Illness:    Amber Santiago 69 y.o. female is seen in the office  today for dx of non small cell cancer of lung. She is long term current smoker (30 pk-yrs), hx of HTN, referred to pulmonary for COPD and for small pulm nodules seen on CT scan chest 03/10/13.  as an outpt. Was given O2 at that time but not currenly using She is active, is able to exert but paces herself. Wheezes occasionally.  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  06/2013 pfts, FEV1 .83 36%  Current Activity/ Functional Status:  Patient is independent with mobility/ambulation, transfers, ADL's, IADL's.   Zubrod Score: At the time of surgery this patient's most appropriate activity status/level should be described as: '[]'$     0    Normal activity, no symptoms '[]'$     1    Restricted in physical strenuous activity but ambulatory, able to do out light work '[x]'$     2    Ambulatory and capable of self care, unable to do work activities, up and about               >50 % of waking hours                              '[]'$     3    Only limited self care, in bed greater than 50% of waking hours '[]'$     4    Completely disabled, no self care, confined to bed or chair '[]'$     5    Moribund   Past Medical History  Diagnosis Date  . COPD (chronic obstructive pulmonary disease) ([facility])   . Asthma   . Hypertension   . Lung nodule     Past Surgical History  Procedure Laterality Date  . Video bronchoscopy Bilateral 03/18/2015    Procedure: VIDEO BRONCHOSCOPY WITH FLUORO;  Surgeon: Collene Gobble, MD;  Location: [facility];  Service: Cardiopulmonary;  Laterality:  Bilateral;    No family history on file.  Social History   Social History  . Marital Status: Married    Spouse Name: N/A  . Number of Children: N/A  . Years of Education: N/A   Occupational History  . retired    Social History Main Topics  . Smoking status: Current Every Day Smoker -- 1.50 packs/day for 30 years    Types: Cigarettes  . Smokeless tobacco: Never Used  . Alcohol Use: No  . Drug Use: No  . Sexual Activity: Not on file   Other Topics Concern  . Not on file   Social History Narrative    History  Smoking status  . Current Every Day Smoker -- 1.50 packs/day for 30 years  . Types: Cigarettes  Smokeless tobacco  . Never Used    History  Alcohol Use No     Allergies  Allergen Reactions  . Penicillins Anaphylaxis  . Amoxicillin     Current Outpatient Prescriptions  Medication Sig Dispense Refill  . Acetaminophen (TYLENOL) 325 MG CAPS Take 500 mg by mouth every 6 (six) hours as needed.    Marland Kitchen albuterol (PROVENTIL HFA;VENTOLIN HFA) 108 (90 BASE) MCG/ACT inhaler Inhale 2 puffs into the lungs every 4 (four) hours as needed for wheezing or shortness of breath. (Patient not taking: Reported on 03/31/2015) 1 Inhaler 5  . Ascorbic Acid (VITAMIN C) 1000 MG tablet Take 1,000 mg by mouth daily.    . bevacizumab (AVASTIN) 1.25 mg/0.1 mL SOLN 1.25 mg by Intravitreal route to Surgery.    . budesonide-formoterol (SYMBICORT) 160-4.5 MCG/ACT inhaler INHALE TWO PUFFS INTO LUNGS TWICE DAILY 11 g 11  . cholecalciferol (VITAMIN D) 1000 UNITS tablet Take 2,000 Units by mouth daily.    . Cyanocobalamin (B-12 PO) Take by mouth daily. Unsure of dosage    . Pyridoxine HCl (B-6 PO) Take by mouth daily. Unsure of dosage.    Marland Kitchen Spacer/Aero-Holding Chambers (AEROCHAMBER MV) inhaler Use as instructed 1 each 0   No current facility-administered medications for this visit.     Review of Systems:     Cardiac Review of Systems: Y or N  Chest Pain [  N  ]  Resting SOB [ Y   ] Exertional SOB  [Y  ]  Orthopnea N  ]   Pedal Edema [  N ]    Palpitations Aqua.Slicker  ] Syncope  [  N]   Presyncope Kristofer.Maker   ]  General Review of Systems: [Y] = yes [  ]=no Constitional: recent weight change [  ];  Wt loss over the last 3 months [9-10 LBS  ] anorexia [  ]; fatigue [  ]; nausea [  ]; night sweats [  ]; fever [  ]; or chills [  ];          Dental: poor dentition[  ]; Last Dentist visit:   Eye : blurred vision [  ]; diplopia [   ]; vision changes [  ];  Amaurosis fugax[  ]; Resp: cough [ Y ];  wheezing[  ];  hemoptysis[  ]; shortness of breath[ Y ]; paroxysmal nocturnal dyspnea[Y  ]; dyspnea on exertion[  ]; or orthopnea[  ];  GI:  gallstones[  ], vomiting[  ];  dysphagia[  ]; melena[  ];  hematochezia [  ]; heartburn[  ];   Hx of  Colonoscopy[  ]; GU: kidney stones [  ]; hematuria[  ];   dysuria [  ];  nocturia[  ];  history of     obstruction [  ]; urinary frequency [  ]             Skin: rash, swelling[  ];, hair loss[  ];  peripheral edema[  ];  or itching[  ]; Musculosketetal: myalgias[  ];  joint swelling[  ];  joint erythema[  ];  joint pain[  ];  back pain[  ];  Heme/Lymph: bruising[  ];  bleeding[  ];  anemia[  ];  Neuro: TIA[ N ];  headaches[  ];  stroke[  N];  vertigo[  ];  seizures[  ];   paresthesias[  ];  difficulty walking[Y  ];  Psych:depression[  ]; anxiety[  ];  Endocrine: diabetes[ N ];  thyroid dysfunction[  ];  Immunizations: Flu up to date Aqua.Slicker  ]; Pneumococcal up to date [ N ];  Other:  Physical Exam: BP 157/75 mmHg  Pulse 71  Temp(Src) 98 F (36.7 C)  Resp 18  Wt 136 lb 4.8 oz (61.825 kg)  SpO2 98%  PHYSICAL EXAMINATION: General appearance: alert and cooperative Head: Normocephalic, without obvious abnormality, atraumatic Neck: no adenopathy, no carotid bruit, no JVD, supple, symmetrical, trachea midline and thyroid not enlarged, symmetric, no tenderness/mass/nodules Lymph nodes: Cervical, supraclavicular, and axillary nodes normal. Resp: diminished breath  sounds bibasilar Back: symmetric, no curvature. ROM normal. No CVA tenderness. Cardio: regular rate and rhythm, S1, S2 normal, no murmur, click, rub or gallop GI: soft, non-tender; bowel sounds normal; no masses,  no organomegaly Extremities: extremities normal, atraumatic, no cyanosis or edema Neurologic: Grossly normal  Partial blindness Diagnostic Studies & Laboratory data: CLINICAL DATA: Followup pulmonary nodules.  EXAM: CT CHEST WITHOUT CONTRAST  TECHNIQUE: Multidetector CT imaging of the chest was performed following the standard protocol without IV contrast.  COMPARISON: Multiple prior chest CTs. The most recent is 02/15/2015  FINDINGS: Mediastinum/Nodes: No supraclavicular or axillary mass or adenopathy. Small stable scattered fatty lymph nodes are noted. No breast masses. The thyroid gland is grossly normal.  The heart is normal in size. No pericardial effusion.  The aorta is normal in caliber. Stable scattered atherosclerotic calcifications. Stable coronary artery calcifications.  The esophagus is grossly normal.  Stable small scattered mediastinal and hilar lymph nodes. No definite adenopathy.  Lungs/Pleura: Stable dense biapical pleural and parenchymal scarring changes. Stable paraseptal emphysema.  There is a 4 x 3 cm spiculated right upper lobe lung mass surrounding the right upper lobe bronchi. This is worrisome for primary lung neoplasm. In retrospect this may have been present on the prior study but was small and difficult to see between the branching vessels. Probable enlarged right hilar nodes but difficult to see without IV contrast. Recommend PET-CT for further evaluation and staging.  Stable small scattered sub 4 mm pulmonary nodules are unchanged and likely benign. No pleural effusion.  Upper abdomen: No significant findings. No adrenal gland or hepatic lesions without contrast.  Musculoskeletal: No significant bony findings.  No worrisome bone lesions or spinal canal compromise. Osteoporosis and scattered hemangiomas are noted in the spine.  IMPRESSION: 1. 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 Probable enlarged right hilar lymph nodes but difficult to be certain without contrast. Recommend PET-CT for further evaluation and staging. 2. Stable small scattered sub 4 mm pulmonary nodules, likely benign. 3. Stable dense apical pleural and parenchymal scarring changes. 4. Stable emphysematous changes. 5. No significant bony findings. These results will be called to the ordering clinician or representative by the Radiologist Assistant, and communication documented in the PACS or zVision Dashboard.   Electronically Signed  By: Marijo Sanes M.D.  On: 02/15/2015 17:24   CLINICAL DATA: Initial treatment strategy for spiculated right lung mass.  EXAM: NUCLEAR MEDICINE PET SKULL BASE TO THIGH  TECHNIQUE: 8.3 mCi F-18 FDG was injected intravenously. Full-ring PET imaging was performed from the skull base to thigh after the radiotracer. CT data was obtained and used for attenuation correction and anatomic localization.  FASTING BLOOD GLUCOSE: Value: 99 mg/dl  COMPARISON: CT scan from 02/15/2015  FINDINGS: NECK  No hypermetabolic lymph nodes in the neck.  CHEST  4.0 x 3.0 cm right upper lobe spiculated lung mass seen on the recent diagnostic chest CT is markedly hypermetabolic with SUV max = 27 since the recent CT scan, there has been development of a a satellite nodule measuring 9 mm (  image 58 series 4) with SUV max = 3.9  Although no lymphadenopathy is seen in the right hilum or mediastinum by size criteria on the uninfused CT images, there is FDG uptake associated with a precarinal lymph node measuring 6 mm short axis (image 66 series 4) and AP window lymph node also 6 mm in short axis (image 65 series 4). Uptake in these lymph nodes is scattered  just above mediastinal levels. Microscopic disease within these lymph nodes cannot be excluded.  Pleural-parenchymal scarring is seen in both lung apices without hypermetabolism. 4 mm left upper lobe nodule on image 59 is stable in the interval with no definite hypermetabolism on PET imaging although the small size may be below the size threshold for reliable resolution on PET imaging. No evidence for pleural effusion.  ABDOMEN/PELVIS  No abnormal hypermetabolic activity within the liver, pancreas, adrenal glands, or spleen. No hypermetabolic lymph nodes in the abdomen or pelvis.  There is abdominal aortic atherosclerosis without aneurysm.  SKELETON  No focal hypermetabolic activity to suggest skeletal metastasis.  IMPRESSION: 4 cm spiculated central right upper lobe mass is markedly hypermetabolic consistent with malignancy. There is been interval development of a satellite nodule which is also hypermetabolic suggesting metastatic disease to the right lung.  Low level FDG uptake in unenlarged right hilar and mediastinal lymph nodes. Microscopic disease is a possibility.  No evidence for hypermetabolic metastases in the neck, abdomen, or pelvis.  Abdominal aortic atherosclerosis.   Electronically Signed  By: Misty Stanley M.D.  On: 02/25/2015 15:14   Recent Radiology Findings:   I have independently reviewed the above radiologic studies.  Recent Lab Findings: Lab Results  Component Value Date   WBC 8.5 03/31/2015   HGB 13.8 03/31/2015   HCT 41.0 03/31/2015   PLT 334 03/31/2015   GLUCOSE 123 03/31/2015   ALT 19 03/31/2015   AST 37* 03/31/2015   NA 134* 03/31/2015   K 4.1 03/31/2015   CREATININE 0.7 03/31/2015   BUN 8.1 03/31/2015   CO2 27 03/31/2015   Diagnosis WANG NEEDLE ,FINE NEEDLE ASPIRATION, RUL, (SPECIMEN 3 OF 3, COLLECTED ON 03/18/2015): MALIGNANT CELLS PRESENT, CONSISTENT WITH SQUAMOUS CELL CARCINOMA. SEE COMMENT. COMMENT: THERE  IS MINIMAL TUMOR PRESENT. DR. Herbie Baltimore [facility]ILLARD HAS REVIEWED THE CASE   Assessment / Plan:   There is no additional tissue for further testing or evaluation of nodes Limited pulmonary reserve, FEV1 .83 in 2015 Possibility of Stage IIIa disease ct3cn1 vs n2 disease Plan repeat pft's , MRI of brain I have recommended proceeding with repeat bronch, EBUS and probility mediastinoscopy, will plan for Jan 3  I  spent 40 minutes counseling the patient face to face and 50% or more the  time was spent in counseling and coordination of care. The total time spent in the appointment was 60 minutes.  Grace Isaac MD      [address] [place] [address] Office [phone]   Beeper [phone]  03/31/2015 7:20 PM

## 2015-03-31 NOTE — Progress Notes (Signed)
[facility] [phone]   [phone] Multidisciplinary thoracic oncology clinic  CONSULT NOTE  REFERRING PHYSICIAN: Dr. Baltazar Apo  REASON FOR CONSULTATION:  69 years old white female recently diagnosed with lung cancer.  HPI Amber Santiago is a 69 y.o. female with past medical history significant for COPD, degenerative disc disease, macular degeneration currently receiving Avastin injection as well as history of cataract and long history of smoking. The patient was followed closely by Dr. Lamonte Sakai for history of COPD. She mentions that she had shortness of breath and was treated for pneumonia last year. She had known bilateral per my nodules that has been stable since December 2014. Unfortunately repeat CT scan of the chest on 02/15/2015 showed 4.0 x 3.0 cm right upper lobe spiculated lung mass worrisome for neoplasm. There was also probably enlarged right hilar lymph nodes and a stable small scattered 4 mm pulmonary nodules likely to be benign. A PET scan was performed on 02/25/2015 showed 4.0 x 3.0 cm right upper lobe spiculated lung mass seen on the recent diagnostic chest CT is markedly hypermetabolic with SUV max = 27 since the recent CT scan, there has been development of a satellite nodule measuring 9 mm (image 58 series 4) with SUV max =3.9. There was also low level FDG uptake in unenlarged the right hilar and mediastinal lymph nodes and microscopic disease is a possibility. There was no evidence for hypermetabolic metastasis in the neck, abdomen or pelvis. On 03/18/2015 the patient underwent flexible video fiberoptic bronchoscopy and biopsies under the care of Dr. Lamonte Sakai.  The final cytology of the fine-needle aspiration, Wang needle of the right upper lobe (Accession: [id number]) showed malignant cells consistent with squamous cell carcinoma. There was insufficient material for further testing. Dr. Lamonte Sakai kindly referred the patient to me today for further  evaluation and recommendation regarding treatment of her condition. When seen today the patient is very anxious and continues to have dry cough with no hemoptysis. She has shortness breath only with exertion and no significant chest pain. She has chronic back pain but she is not on any medication. She lost around 10 pounds in the last 4 weeks secondary to lack of appetite after her diagnosis. She denied having any headache or visual changes. Family history significant for mother with end-stage renal disease and father died from old age. The patient is married and has 4 children. She was accompanied today by her husband [name] and Dr. Jonelle Sidle. She used to work as a Theme park manager. She has a history of smoking more than one pack per day for around 45 years and trying to quit. She has no history of alcohol or drug abuse.  HPI  Past Medical History  Diagnosis Date  . COPD (chronic obstructive pulmonary disease) ([facility])   . Asthma   . Hypertension   . Lung nodule     Past Surgical History  Procedure Laterality Date  . Video bronchoscopy Bilateral 03/18/2015    Procedure: VIDEO BRONCHOSCOPY WITH FLUORO;  Surgeon: Collene Gobble, MD;  Location: [facility];  Service: Cardiopulmonary;  Laterality: Bilateral;    History reviewed. No pertinent family history.  Social History Social History  Substance Use Topics  . Smoking status: Current Every Day Smoker -- 1.50 packs/day for 30 years    Types: Cigarettes  . Smokeless tobacco: Never Used  . Alcohol Use: No    Allergies  Allergen Reactions  . Penicillins Anaphylaxis  . Amoxicillin     Current Outpatient Prescriptions  Medication Sig Dispense Refill  . Acetaminophen (TYLENOL) 325 MG CAPS Take 500 mg by mouth every 6 (six) hours as needed.    . Ascorbic Acid (VITAMIN C) 1000 MG tablet Take 1,000 mg by mouth daily.    . bevacizumab (AVASTIN) 1.25 mg/0.1 mL SOLN 1.25 mg by Intravitreal route to Surgery.    . budesonide-formoterol (SYMBICORT)  160-4.5 MCG/ACT inhaler INHALE TWO PUFFS INTO LUNGS TWICE DAILY 11 g 11  . cholecalciferol (VITAMIN D) 1000 UNITS tablet Take 2,000 Units by mouth daily.    . Cyanocobalamin (B-12 PO) Take by mouth daily. Unsure of dosage    . Pyridoxine HCl (B-6 PO) Take by mouth daily. Unsure of dosage.    Marland Kitchen Spacer/Aero-Holding Chambers (AEROCHAMBER MV) inhaler Use as instructed 1 each 0  . albuterol (PROVENTIL HFA;VENTOLIN HFA) 108 (90 BASE) MCG/ACT inhaler Inhale 2 puffs into the lungs every 4 (four) hours as needed for wheezing or shortness of breath. (Patient not taking: Reported on 03/31/2015) 1 Inhaler 5   No current facility-administered medications for this visit.    Review of Systems  Constitutional: positive for fatigue and weight loss Eyes: negative Ears, nose, mouth, throat, and face: negative Respiratory: positive for cough, dyspnea on exertion and wheezing Cardiovascular: negative Gastrointestinal: negative Genitourinary:negative Integument/breast: negative Hematologic/lymphatic: negative Musculoskeletal:negative Neurological: negative Behavioral/Psych: positive for anxiety Endocrine: negative Allergic/Immunologic: negative  Physical Exam  ACZ:YSAYT, healthy, no distress, well nourished, well developed and anxious SKIN: skin color, texture, turgor are normal, no rashes or significant lesions HEAD: Normocephalic, No masses, lesions, tenderness or abnormalities EYES: normal, PERRLA, Conjunctiva are pink and non-injected EARS: External ears normal, Canals clear OROPHARYNX:no exudate, no erythema and lips, buccal mucosa, and tongue normal  NECK: supple, no adenopathy, no JVD LYMPH:  no palpable lymphadenopathy, no hepatosplenomegaly BREAST:not examined LUNGS: expiratory wheezes bilaterally HEART: regular rate & rhythm, no murmurs and no gallops ABDOMEN:abdomen soft, non-tender, normal bowel sounds and no masses or organomegaly BACK: Back symmetric, no curvature., No CVA  tenderness EXTREMITIES:no joint deformities, effusion, or inflammation, no edema, no skin discoloration  NEURO: alert & oriented x 3 with fluent speech, no focal motor/sensory deficits  PERFORMANCE STATUS: ECOG 1  LABORATORY DATA: Lab Results  Component Value Date   WBC 8.5 03/31/2015   HGB 13.8 03/31/2015   HCT 41.0 03/31/2015   MCV 89.1 03/31/2015   PLT 334 03/31/2015      Chemistry      Component Value Date/Time   NA 134* 03/31/2015 1345   K 4.1 03/31/2015 1345   CO2 27 03/31/2015 1345   BUN 8.1 03/31/2015 1345   CREATININE 0.7 03/31/2015 1345      Component Value Date/Time   CALCIUM 9.2 03/31/2015 1345   ALKPHOS 87 03/31/2015 1345   AST 37* 03/31/2015 1345   ALT 19 03/31/2015 1345   BILITOT 0.40 03/31/2015 1345       RADIOGRAPHIC STUDIES: Dg Chest Port 1 View  03/18/2015  CLINICAL DATA:  Post bronchoscopy with biopsy. EXAM: PORTABLE CHEST 1 VIEW COMPARISON:  PET-CT scan 02/25/2015 FINDINGS: Normal cardiac silhouette. RIGHT suprahilar mass again noted. No evidence of pneumothorax following biopsy. No pulmonary hemorrhage or pleural fluid. IMPRESSION: 1. No complication following biopsy. 2. RIGHT suprahilar mass again noted. Electronically Signed   By: Suzy Bouchard M.D.   On: 03/18/2015 09:42   Dg C-arm Bronchoscopy  03/18/2015  CLINICAL DATA:  C-ARM BRONCHOSCOPY Fluoroscopy was utilized by the requesting physician.  No radiographic interpretation.    ASSESSMENT: This is a very  pleasant 69 years old white female recently diagnosed with non-small cell lung cancer, squamous cell carcinoma questionable for stage IIB (T3, N0, M0) versus a stage IIIA (T3, N2, M0) diagnosed in December 2016 and presenting with large right upper lobe lung mass in addition to a satellite nodule was suspicious right hilar and mediastinal lymphadenopathy.   PLAN: I had a lengthy discussion with the patient and her family today about her current disease stage, prognosis and treatment  options. I will complete the staging workup by ordering a MRI of the brain to rule out brain metastasis. The patient will need further evaluation of the mediastinal and right hilar lymph nodes before making a final decision regarding her treatment. If the right hilar and mediastinal lymph nodes are negative for malignancy and her pulmonary functions are appropriate she may benefit from surgical resection followed by adjuvant systemic chemotherapy. We will also need more tissue for PDL 1 expression. She has poor pulmonary function or the mediastinal lymph nodes are positive for malignancy, we will consider the patient for a course of concurrent chemoradiation followed by consolidation chemotherapy. The patient will be seen later today by Dr. Servando Snare for reevaluation and consideration of repeat bronchoscopy, endobronchial ultrasound and biopsy of the lymph node plus/minus mediastinoscopy. She will also see Dr. Sondra Come from radiation oncology for evaluation and discussion of the radiotherapy option if she is not a surgical candidate for resection. I will arrange for the patient to come back for follow-up visit in less than 2 weeks for more detailed discussion of her treatment options based on the final pathology and staging workup. The patient was seen during the multidisciplinary thoracic oncology clinic today by medical oncology, radiation oncology, thoracic surgery, thoracic navigator as well as social worker. For smoke cessation, I strongly encouraged the patient to quit smoking and offered her to smoke cessation program. She was advised to call immediately if she has any concerning symptoms in the interval.  The patient voices understanding of current disease status and treatment options and is in agreement with the current care plan.  All questions were answered. The patient knows to call the clinic with any problems, questions or concerns. We can certainly see the patient much sooner if  necessary.  Thank you so much for allowing me to participate in the care of [facility]. I will continue to follow up the patient with you and assist in her care.  I spent 55 minutes counseling the patient face to face. The total time spent in the appointment was 80 minutes.  Disclaimer: This note was dictated with voice recognition software. Similar sounding words can inadvertently be transcribed and may not be corrected upon review.   Abraham Margulies K. March 31, 2015, 5:08 PM

## 2015-03-31 NOTE — Patient Instructions (Signed)
Smoking Cessation, Tips for Success If you are ready to quit smoking, congratulations! You have chosen to help yourself be healthier. Cigarettes bring nicotine, tar, carbon monoxide, and other irritants into your body. Your lungs, heart, and blood vessels will be able to work better without these poisons. There are many different ways to quit smoking. Nicotine gum, nicotine patches, a nicotine inhaler, or nicotine nasal spray can help with physical craving. Hypnosis, support groups, and medicines help break the habit of smoking. WHAT THINGS CAN I DO TO MAKE QUITTING EASIER?  Here are some tips to help you quit for good:  Pick a date when you will quit smoking completely. Tell all of your friends and family about your plan to quit on that date.  Do not try to slowly cut down on the number of cigarettes you are smoking. Pick a quit date and quit smoking completely starting on that day.  Throw away all cigarettes.   Clean and remove all ashtrays from your home, work, and car.  On a card, write down your reasons for quitting. Carry the card with you and read it when you get the urge to smoke.  Cleanse your body of nicotine. Drink enough water and fluids to keep your urine clear or pale yellow. Do this after quitting to flush the nicotine from your body.  Learn to predict your moods. Do not let a bad situation be your excuse to have a cigarette. Some situations in your life might tempt you into wanting a cigarette.  Never have "just one" cigarette. It leads to wanting another and another. Remind yourself of your decision to quit.  Change habits associated with smoking. If you smoked while driving or when feeling stressed, try other activities to replace smoking. Stand up when drinking your coffee. Brush your teeth after eating. Sit in a different chair when you read the paper. Avoid alcohol while trying to quit, and try to drink fewer caffeinated beverages. Alcohol and caffeine may urge you to  smoke.  Avoid foods and drinks that can trigger a desire to smoke, such as sugary or spicy foods and alcohol.  Ask people who smoke not to smoke around you.  Have something planned to do right after eating or having a cup of coffee. For example, plan to take a walk or exercise.  Try a relaxation exercise to calm you down and decrease your stress. Remember, you may be tense and nervous for the first 2 weeks after you quit, but this will pass.  Find new activities to keep your hands busy. Play with a pen, coin, or rubber band. Doodle or draw things on paper.  Brush your teeth right after eating. This will help cut down on the craving for the taste of tobacco after meals. You can also try mouthwash.   Use oral substitutes in place of cigarettes. Try using lemon drops, carrots, cinnamon sticks, or chewing gum. Keep them handy so they are available when you have the urge to smoke.  When you have the urge to smoke, try deep breathing.  Designate your home as a nonsmoking area.  If you are a heavy smoker, ask your health care provider about a prescription for nicotine chewing gum. It can ease your withdrawal from nicotine.  Reward yourself. Set aside the cigarette money you save and buy yourself something nice.  Look for support from others. Join a support group or smoking cessation program. Ask someone at home or at work to help you with your plan   to quit smoking.  Always ask yourself, "Do I need this cigarette or is this just a reflex?" Tell yourself, "Today, I choose not to smoke," or "I do not want to smoke." You are reminding yourself of your decision to quit.  Do not replace cigarette smoking with electronic cigarettes (commonly called e-cigarettes). The safety of e-cigarettes is unknown, and some may contain harmful chemicals.  If you relapse, do not give up! Plan ahead and think about what you will do the next time you get the urge to smoke. HOW WILL I FEEL WHEN I QUIT SMOKING? You  may have symptoms of withdrawal because your body is used to nicotine (the addictive substance in cigarettes). You may crave cigarettes, be irritable, feel very hungry, cough often, get headaches, or have difficulty concentrating. The withdrawal symptoms are only temporary. They are strongest when you first quit but will go away within 10-14 days. When withdrawal symptoms occur, stay in control. Think about your reasons for quitting. Remind yourself that these are signs that your body is healing and getting used to being without cigarettes. Remember that withdrawal symptoms are easier to treat than the major diseases that smoking can cause.  Even after the withdrawal is over, expect periodic urges to smoke. However, these cravings are generally short lived and will go away whether you smoke or not. Do not smoke! WHAT RESOURCES ARE AVAILABLE TO HELP ME QUIT SMOKING? Your health care provider can direct you to community resources or hospitals for support, which may include:  Group support.  Education.  Hypnosis.  Therapy.   This information is not intended to replace advice given to you by your health care provider. Make sure you discuss any questions you have with your health care provider.   Document Released: 12/23/2003 Document Revised: 04/16/2014 Document Reviewed: 09/11/2012 Elsevier Interactive Patient Education 2016 [employer]  

## 2015-03-31 NOTE — Progress Notes (Signed)
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 female who is being followed by Dr. Lamonte Sakai, of pulmonology, for COPD for the last two years. She has a history of scattered, stable, bilateral pulmonary nodules measuring up to 4 mm. CT of the chest on 02/15/15 showed a 4 x 3 cm right upper lobe spiculated lung mass that is worrisome for a neoplasm. Probable enlarged right hilar lymph nodes were noted as well. Stable emphysematous changes and stable dense apical pleural and parenchymal scarring changes were also noted. PET scan on 02/25/15 confirmed that the 4 cm spiculated central right upper lobe mass was hypermetabolic and consistent with malignancy with an SUV max of 27. A 9 mm satellite nodule was also seen with an SUV max of 3.9. There was also low level uptake in unenlarged right hilar and mediastinal lymph nodes. Transbronchial biopsy on 03/18/15 of the right upper lobe revealed benign vascular structure and blood and no evidence of malignancy. 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 She presents to [facility] to discuss the management of her disease with her husband and daughter.  PREVIOUS RADIATION THERAPY: No  PAST MEDICAL HISTORY:  has a past medical history of COPD (chronic obstructive pulmonary disease) ([place]); Asthma; Hypertension; and Lung nodule.    PAST SURGICAL HISTORY: Past Surgical History  Procedure Laterality Date  . Video bronchoscopy Bilateral 03/18/2015    Procedure: VIDEO BRONCHOSCOPY WITH FLUORO;  Surgeon: Collene Gobble, MD;  Location: [facility];  Service:  Cardiopulmonary;  Laterality: Bilateral;    FAMILY HISTORY: family history is not on file.  SOCIAL HISTORY:  reports that she has been smoking Cigarettes.  She has a 45 pack-year smoking history. She has never used smokeless tobacco. She reports that she does not drink alcohol or use illicit drugs.  ALLERGIES: Penicillins and Amoxicillin  MEDICATIONS:  Current Outpatient Prescriptions  Medication Sig Dispense Refill  . Acetaminophen (TYLENOL) 325 MG CAPS Take 500 mg by mouth every 6 (six) hours as needed.    Marland Kitchen albuterol (PROVENTIL HFA;VENTOLIN HFA) 108 (90 BASE) MCG/ACT inhaler Inhale 2 puffs into the lungs every 4 (four) hours as needed for wheezing or shortness of breath. (Patient not taking: Reported on 03/31/2015) 1 Inhaler 5  . Ascorbic Acid (VITAMIN C) 1000 MG tablet Take 1,000 mg by mouth daily.    . bevacizumab (AVASTIN) 1.25 mg/0.1 mL SOLN 1.25 mg by Intravitreal route to Surgery.    . budesonide-formoterol (SYMBICORT) 160-4.5 MCG/ACT inhaler INHALE TWO PUFFS INTO LUNGS TWICE DAILY 11 g 11  . cholecalciferol (VITAMIN D) 1000 UNITS tablet Take 2,000 Units by mouth daily.    . Cyanocobalamin (B-12 PO) Take by mouth daily. Unsure of dosage    . Pyridoxine HCl (B-6 PO) Take by mouth daily. Unsure of dosage.    Marland Kitchen Spacer/Aero-Holding Chambers (AEROCHAMBER MV) inhaler Use as instructed 1 each 0   No current facility-administered medications for this encounter.    REVIEW OF SYSTEMS:  A 15 point review of systems is documented in the electronic medical record. This was obtained by the nursing staff. However, I reviewed this with the patient to  discuss relevant findings and make appropriate changes.  Pertinent items are noted in HPI.   She denies hemoptysis. She has a productive cough with clear phlegm. No chest pain or significant breathing problems   PHYSICAL EXAM:  weight is 136 lb 4.8 oz (61.825 kg). Her temperature is 98 F (36.7 C). Her blood pressure is 157/75 and her pulse is 71.  Her respiration is 18 and oxygen saturation is 98%.   Lungs are clear to auscultation bilaterally. Heart has regular rate and rhythm. No palpable cervical, supraclavicular, or axillary adenopathy. Oropharynx is clear. She is edentulous. Symmetric strength and muscle tone throughout. Some edema in the bilateral ankles.  ECOG = 1  LABORATORY DATA:  Lab Results  Component Value Date   WBC 8.5 03/31/2015   HGB 13.8 03/31/2015   HCT 41.0 03/31/2015   MCV 89.1 03/31/2015   PLT 334 03/31/2015   NEUTROABS 6.2 03/31/2015   Lab Results  Component Value Date   NA 134* 03/31/2015   K 4.1 03/31/2015   CO2 27 03/31/2015   GLUCOSE 123 03/31/2015   CREATININE 0.7 03/31/2015   CALCIUM 9.2 03/31/2015      RADIOGRAPHY: Dg Chest Port 1 View  03/18/2015  CLINICAL DATA:  Post bronchoscopy with biopsy. EXAM: PORTABLE CHEST 1 VIEW COMPARISON:  PET-CT scan 02/25/2015 FINDINGS: Normal cardiac silhouette. RIGHT suprahilar mass again noted. No evidence of pneumothorax following biopsy. No pulmonary hemorrhage or pleural fluid. IMPRESSION: 1. No complication following biopsy. 2. RIGHT suprahilar mass again noted. Electronically Signed   By: Suzy Bouchard M.D.   On: 03/18/2015 09:42   Dg C-arm Bronchoscopy  03/18/2015  CLINICAL DATA:  C-ARM BRONCHOSCOPY Fluoroscopy was utilized by the requesting physician.  No radiographic interpretation.      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 The patient's case was discussed in the [facility] conference this morning. Recommendations were in consideration for mediastinoscopy to evaluate the mediastinal lymph nodes. The patient will be seen later today by Dr. Servando Snare. If the patient is not a candidate for surgery, she would likely be a candidate for a definitive course of radiation and radiosensitizing chemotherapy. The patient would also undergo an MRI scan of the brain to rule out spreading to this area. Final management details would be made once the  brain MRI and thoracic surgery input are complete.  PLAN: The patient will undergo a brain MRI and mediastinoscopy. We discussed the possibility of chemoradiation in the management of her disease. We discussed 6 weeks of treatment as an outpatient. We discussed the process of CT simulation and the placement of tattoos. We discussed skin irritation, dysphagia, weight loss, and fatigue as the acute side effects of radiation. We also discussed potential long-term toxicities of chest radiation therapy.     ------------------------------------------------  Blair Promise, PhD, MD  This document serves as a record of services personally performed by Gery Pray, MD. It was created on his behalf by Darcus Austin, a trained medical scribe. The creation of this record is based on the scribe's personal observations and the provider's statements to them. This document has been checked and approved by the attending provider.

## 2015-04-05 ENCOUNTER — Encounter: Payer: Self-pay | Admitting: *Deleted

## 2015-04-05 ENCOUNTER — Other Ambulatory Visit: Payer: Self-pay | Admitting: *Deleted

## 2015-04-05 DIAGNOSIS — R918 Other nonspecific abnormal finding of lung field: Secondary | ICD-10-CM

## 2015-04-05 NOTE — Progress Notes (Signed)
Oncology Nurse Navigator Documentation  Oncology Nurse Navigator Flowsheets 04/05/2015  Navigator Encounter Type Telephone/patient's husband called.  He was concerned about pre-cert for bronch, labs, PFT, and MRI Brain.  I called [facility] and follow up with Ebony Hail.  She stated bronch, labs nor PFT need pre-cert.  She also stated she would follow up with insurance company to make sure. I contacted pre-cert for MRI Brain.  She is pre-certed.  I called central scheduling to schedule.  I then called husband back and he was updated on status and appt for MRI Brain.  He was thankful for the help.   Patient Visit Type Follow-up  Treatment Phase Other  Interventions Coordination of Care  Coordination of Care Radiology  Time Spent with Patient 45

## 2015-04-06 ENCOUNTER — Ambulatory Visit (HOSPITAL_COMMUNITY)
Admission: RE | Admit: 2015-04-06 | Discharge: 2015-04-06 | Disposition: A | Discharge status: Home / Self Care | Payer: Managed Care, Other (non HMO) | Source: Ambulatory Visit | Attending: Cardiothoracic Surgery | Admitting: Cardiothoracic Surgery

## 2015-04-06 ENCOUNTER — Encounter (HOSPITAL_COMMUNITY): Payer: Self-pay

## 2015-04-06 ENCOUNTER — Other Ambulatory Visit: Payer: Self-pay | Admitting: *Deleted

## 2015-04-06 ENCOUNTER — Encounter (HOSPITAL_COMMUNITY)
Admission: RE | Admit: 2015-04-06 | Discharge: 2015-04-06 | Disposition: A | Discharge status: Home / Self Care | Payer: Managed Care, Other (non HMO) | Source: Ambulatory Visit | Attending: Cardiothoracic Surgery | Admitting: Cardiothoracic Surgery

## 2015-04-06 VITALS — BP 143/55 | HR 57 | Temp 97.7°F | Resp 20 | Ht 64.0 in | Wt 138.6 lb

## 2015-04-06 DIAGNOSIS — Z01812 Encounter for preprocedural laboratory examination: Secondary | ICD-10-CM | POA: Diagnosis not present

## 2015-04-06 DIAGNOSIS — Z0183 Encounter for blood typing: Secondary | ICD-10-CM | POA: Insufficient documentation

## 2015-04-06 DIAGNOSIS — Z01818 Encounter for other preprocedural examination: Secondary | ICD-10-CM | POA: Insufficient documentation

## 2015-04-06 DIAGNOSIS — R001 Bradycardia, unspecified: Secondary | ICD-10-CM | POA: Insufficient documentation

## 2015-04-06 DIAGNOSIS — R918 Other nonspecific abnormal finding of lung field: Secondary | ICD-10-CM

## 2015-04-06 HISTORY — DX: Umbilical hernia without obstruction or gangrene: K42.9

## 2015-04-06 HISTORY — DX: Pneumonia, unspecified organism: J18.9

## 2015-04-06 HISTORY — DX: Constipation, unspecified: K59.00

## 2015-04-06 HISTORY — DX: Unspecified macular degeneration: H35.30

## 2015-04-06 HISTORY — DX: Unspecified osteoarthritis, unspecified site: M19.90

## 2015-04-06 HISTORY — DX: Malignant (primary) neoplasm, unspecified: C80.1

## 2015-04-06 HISTORY — DX: Thyrotoxicosis, unspecified without thyrotoxic crisis or storm: E05.90

## 2015-04-06 LAB — PULMONARY FUNCTION TEST
DL/VA % pred: 90 %
DL/VA: 4.34 ml/min/mmHg/L
DLCO cor % pred: 66 %
DLCO cor: 16.1 ml/min/mmHg
DLCO unc % pred: 67 %
DLCO unc: 16.3 ml/min/mmHg
FEF 25-75 Post: 0.46 L/sec
FEF 25-75 Pre: 0.54 L/sec
FEF2575-%Change-Post: -15 %
FEF2575-%Pred-Post: 24 %
FEF2575-%Pred-Pre: 29 %
FEV1-%Change-Post: -6 %
FEV1-%Pred-Post: 48 %
FEV1-%Pred-Pre: 52 %
FEV1-Post: 1.09 L
FEV1-Pre: 1.17 L
FEV1FVC-%Change-Post: -1 %
FEV1FVC-%Pred-Pre: 75 %
FEV6-%Change-Post: -3 %
FEV6-%Pred-Post: 66 %
FEV6-%Pred-Pre: 68 %
FEV6-Post: 1.89 L
FEV6-Pre: 1.95 L
FEV6FVC-%Change-Post: 1 %
FEV6FVC-%Pred-Post: 101 %
FEV6FVC-%Pred-Pre: 100 %
FVC-%Change-Post: -4 %
FVC-%Pred-Post: 65 %
FVC-%Pred-Pre: 68 %
FVC-Post: 1.93 L
FVC-Pre: 2.03 L
Post FEV1/FVC ratio: 57 %
Post FEV6/FVC ratio: 98 %
Pre FEV1/FVC ratio: 58 %
Pre FEV6/FVC Ratio: 96 %
RV % pred: 245 %
RV: 5.41 L
TLC % pred: 147 %
TLC: 7.46 L

## 2015-04-06 LAB — COMPREHENSIVE METABOLIC PANEL
ALT: 17 U/L (ref 14–54)
AST: 33 U/L (ref 15–41)
Albumin: 3.6 g/dL (ref 3.5–5.0)
Alkaline Phosphatase: 78 U/L (ref 38–126)
Anion gap: 9 (ref 5–15)
BUN: 6 mg/dL (ref 6–20)
CO2: 29 mmol/L (ref 22–32)
Calcium: 9.3 mg/dL (ref 8.9–10.3)
Chloride: 99 mmol/L — ABNORMAL LOW (ref 101–111)
Creatinine, Ser: 0.61 mg/dL (ref 0.44–1.00)
GFR calc Af Amer: >60 mL/min (ref >60)
GFR calc non Af Amer: >60 mL/min (ref >60)
Glucose, Bld: 113 mg/dL — ABNORMAL HIGH (ref 65–99)
Potassium: 4.3 mmol/L (ref 3.5–5.1)
Sodium: 137 mmol/L (ref 135–145)
Total Bilirubin: 0.6 mg/dL (ref 0.3–1.2)
Total Protein: 6.2 g/dL — ABNORMAL LOW (ref 6.5–8.1)

## 2015-04-06 LAB — CBC
HCT: 40.4 % (ref 36.0–46.0)
Hemoglobin: 13.4 g/dL (ref 12.0–15.0)
MCH: 29.5 pg (ref 26.0–34.0)
MCHC: 33.2 g/dL (ref 30.0–36.0)
MCV: 88.8 fL (ref 78.0–100.0)
Platelets: 270 10*3/uL (ref 150–400)
RBC: 4.55 MIL/uL (ref 3.87–5.11)
RDW: 14 % (ref 11.5–15.5)
WBC: 9.9 10*3/uL (ref 4.0–10.5)

## 2015-04-06 LAB — SURGICAL PCR SCREEN
MRSA, PCR: NEGATIVE
Staphylococcus aureus: NEGATIVE

## 2015-04-06 LAB — APTT: aPTT: 31 seconds (ref 24–37)

## 2015-04-06 LAB — ABO/RH: ABO/RH(D): A POS

## 2015-04-06 LAB — PROTIME-INR
INR: 1.05 (ref 0.00–1.49)
Prothrombin Time: 13.9 seconds (ref 11.6–15.2)

## 2015-04-06 LAB — TYPE AND SCREEN
ABO/RH(D): A POS
Antibody Screen: NEGATIVE

## 2015-04-06 MED ORDER — ALBUTEROL SULFATE (2.5 MG/3ML) 0.083% IN NEBU
2.5000 mg | INHALATION_SOLUTION | Freq: Once | RESPIRATORY_TRACT | Status: AC
  Administered 2015-04-06: 2.5 mg via RESPIRATORY_TRACT

## 2015-04-06 NOTE — Pre-Procedure Instructions (Signed)
Amber Santiago  04/06/2015      [facility] - [place], [place] - [address] [place] [address] Phone: [phone] Fax: [phone]    Your procedure is scheduled on Tuesday, April 12, 2015  Report to [facility] Admitting at 5:30 A.M.  Call this number if you have problems the morning of surgery:  [phone]   Remember:  Do not eat food or drink liquids after midnight Monday, April 11, 2015  Take these medicines the morning of surgery with A SIP OF WATER :budesonide-formoterol (SYMBICORT) 160-4.5 MCG/ACT inhaler, if needed:Acetaminophen (TYLENOL)  Stop taking Aspirin, vitamins and herbal medications such Garlic, Tumeric. Do not take any NSAIDs ie: Ibuprofen, Advil, Naproxen or any medication containing Aspirin; stop now.   Do not wear jewelry, make-up or nail polish.  Do not wear lotions, powders, or perfumes.  You may not  wear deodorant.  Do not shave 48 hours prior to surgery.    Do not bring valuables to the hospital.  [facility] is not responsible for any belongings or valuables.  Contacts, dentures or bridgework may not be worn into surgery.  Leave your suitcase in the car.  After surgery it may be brought to your room.  For patients admitted to the hospital, discharge time will be determined by your treatment team.  Patients discharged the day of surgery will not be allowed to drive home.   Name and phone number of your driver:  Special instructions:  Special Instructions:Special Instructions: [facility] - Preparing for Surgery  Before surgery, you can play an important role.  Because skin is not sterile, your skin needs to be as free of germs as possible.  You can reduce the number of germs on you skin by washing with CHG (chlorahexidine gluconate) soap before surgery.  CHG is an antiseptic cleaner which kills germs and bonds with the skin to continue killing germs even after washing.  Please DO NOT  use if you have an allergy to CHG or antibacterial soaps.  If your skin becomes reddened/irritated stop using the CHG and inform your nurse when you arrive at Short Stay.  Do not shave (including legs and underarms) for at least 48 hours prior to the first CHG shower.  You may shave your face.  Please follow these instructions carefully:   1.  Shower with CHG Soap the night before surgery and the morning of Surgery.  2.  If you choose to wash your hair, wash your hair first as usual with your normal shampoo.  3.  After you shampoo, rinse your hair and body thoroughly to remove the Shampoo.  4.  Use CHG as you would any other liquid soap.  You can apply chg directly  to the skin and wash gently with scrungie or a clean washcloth.  5.  Apply the CHG Soap to your body ONLY FROM THE NECK DOWN.  Do not use on open wounds or open sores.  Avoid contact with your eyes, ears, mouth and genitals (private parts).  Wash genitals (private parts) with your normal soap.  6.  Wash thoroughly, paying special attention to the area where your surgery will be performed.  7.  Thoroughly rinse your body with warm water from the neck down.  8.  DO NOT shower/wash with your normal soap after using and rinsing off the CHG Soap.  9.  Pat yourself dry with a clean towel.  10.  Wear clean pajamas.            11.  Place clean sheets on your bed the night of your first shower and do not sleep with pets.  Day of Surgery  Do not apply any lotions/deodorants the morning of surgery.  Please wear clean clothes to the hospital/surgery center.  Please read over the following fact sheets that you were given. Pain Booklet, Coughing and Deep Breathing, Blood Transfusion Information, MRSA Information and Surgical Site Infection Prevention

## 2015-04-06 NOTE — Progress Notes (Signed)
Pt denies SOB, chest pain, and being under the care of a cardiologist. Pt denies having a stress test, echo and cardiac cath. Pt denies having an EKG within the last month.

## 2015-04-08 ENCOUNTER — Ambulatory Visit (HOSPITAL_COMMUNITY)
Admission: RE | Admit: 2015-04-08 | Discharge: 2015-04-08 | Disposition: A | Discharge status: Home / Self Care | Payer: Managed Care, Other (non HMO) | Source: Ambulatory Visit | Attending: Internal Medicine | Admitting: Internal Medicine

## 2015-04-08 ENCOUNTER — Telehealth: Payer: Self-pay | Admitting: *Deleted

## 2015-04-08 DIAGNOSIS — M2548 Effusion, other site: Secondary | ICD-10-CM | POA: Diagnosis not present

## 2015-04-08 DIAGNOSIS — C3491 Malignant neoplasm of unspecified part of right bronchus or lung: Secondary | ICD-10-CM | POA: Diagnosis present

## 2015-04-08 DIAGNOSIS — Z8673 Personal history of transient ischemic attack (TIA), and cerebral infarction without residual deficits: Secondary | ICD-10-CM | POA: Diagnosis not present

## 2015-04-08 DIAGNOSIS — F411 Generalized anxiety disorder: Secondary | ICD-10-CM

## 2015-04-08 MED ORDER — LORAZEPAM 0.5 MG PO TABS: 0.5000 mg | ORAL_TABLET | Freq: Two times a day (BID) | ORAL | Status: DC

## 2015-04-08 MED ORDER — GADOBENATE DIMEGLUMINE 529 MG/ML IV SOLN
15.0000 mL | Freq: Once | INTRAVENOUS | Status: AC | PRN
  Administered 2015-04-08: 13 mL via INTRAVENOUS

## 2015-04-08 NOTE — Telephone Encounter (Signed)
VM message received from pt's husband stating that pt is very anxious about upcoming bronchoscopy on Tuesday.  TC back to husband, he states the same and he requests a prescription for some anti-anxiety meds to get her through the weekend. Spoke with Selena Lesser, NP as Dr. Julien Nordmann is not in the office. Received order for lorazepam 0.5 mg BID as needed for anxiety. This was called in to pt's pharmacy.  TC back to husband to make him aware of prescription.  He will pick it up this evening.

## 2015-04-08 NOTE — Telephone Encounter (Signed)
Oncology Nurse Navigator Documentation  Oncology Nurse Navigator Flowsheets 04/08/2015  Navigator Encounter Type Telephone/patient's husband called and stated his wife needs something for anxiety.  I called him back and asked that he call triage for assistance.  He stated he would.   Patient Visit Type Follow-up  Treatment Phase Other  Interventions Coordination of Care  Time Spent with Patient 15

## 2015-04-11 MED ORDER — CHLORHEXIDINE GLUCONATE 4 % EX LIQD: 60.0000 mL | Freq: Once | CUTANEOUS | Status: DC

## 2015-04-11 MED ORDER — VANCOMYCIN HCL IN DEXTROSE 1-5 GM/200ML-% IV SOLN
1000.0000 mg | INTRAVENOUS | Status: AC
  Administered 2015-04-12: 1000 mg via INTRAVENOUS
  Filled 2015-04-11: qty 200

## 2015-04-12 ENCOUNTER — Ambulatory Visit (HOSPITAL_COMMUNITY): Payer: Managed Care, Other (non HMO)

## 2015-04-12 ENCOUNTER — Ambulatory Visit (HOSPITAL_COMMUNITY)
Admission: RE | Admit: 2015-04-12 | Discharge: 2015-04-12 | Disposition: A | Discharge status: Home / Self Care | Payer: Managed Care, Other (non HMO) | Source: Ambulatory Visit | Attending: Cardiothoracic Surgery | Admitting: Cardiothoracic Surgery

## 2015-04-12 ENCOUNTER — Encounter (HOSPITAL_COMMUNITY)
Admission: RE | Disposition: A | Discharge status: Home / Self Care | Payer: Self-pay | Source: Ambulatory Visit | Attending: Cardiothoracic Surgery

## 2015-04-12 ENCOUNTER — Telehealth: Payer: Self-pay | Admitting: *Deleted

## 2015-04-12 ENCOUNTER — Other Ambulatory Visit: Payer: Self-pay | Admitting: *Deleted

## 2015-04-12 ENCOUNTER — Ambulatory Visit (HOSPITAL_COMMUNITY): Payer: Managed Care, Other (non HMO) | Admitting: Anesthesiology

## 2015-04-12 DIAGNOSIS — J449 Chronic obstructive pulmonary disease, unspecified: Secondary | ICD-10-CM | POA: Insufficient documentation

## 2015-04-12 DIAGNOSIS — F17210 Nicotine dependence, cigarettes, uncomplicated: Secondary | ICD-10-CM | POA: Diagnosis not present

## 2015-04-12 DIAGNOSIS — C3411 Malignant neoplasm of upper lobe, right bronchus or lung: Secondary | ICD-10-CM | POA: Diagnosis not present

## 2015-04-12 DIAGNOSIS — I10 Essential (primary) hypertension: Secondary | ICD-10-CM | POA: Diagnosis not present

## 2015-04-12 DIAGNOSIS — R918 Other nonspecific abnormal finding of lung field: Secondary | ICD-10-CM | POA: Diagnosis present

## 2015-04-12 DIAGNOSIS — C3491 Malignant neoplasm of unspecified part of right bronchus or lung: Secondary | ICD-10-CM

## 2015-04-12 DIAGNOSIS — R222 Localized swelling, mass and lump, trunk: Secondary | ICD-10-CM | POA: Diagnosis not present

## 2015-04-12 DIAGNOSIS — Z9889 Other specified postprocedural states: Secondary | ICD-10-CM

## 2015-04-12 HISTORY — PX: VIDEO BRONCHOSCOPY WITH ENDOBRONCHIAL ULTRASOUND: SHX6177

## 2015-04-12 HISTORY — PX: MEDIASTINOSCOPY: SHX5086

## 2015-04-12 SURGERY — BRONCHOSCOPY, WITH EBUS
Anesthesia: General | Site: Chest

## 2015-04-12 MED ORDER — ONDANSETRON HCL 4 MG/2ML IJ SOLN
INTRAMUSCULAR | Status: DC | PRN
  Administered 2015-04-12: 4 mg via INTRAVENOUS

## 2015-04-12 MED ORDER — PROPOFOL 10 MG/ML IV BOLUS
INTRAVENOUS | Status: DC | PRN
  Administered 2015-04-12: 150 mg via INTRAVENOUS

## 2015-04-12 MED ORDER — SUGAMMADEX SODIUM 200 MG/2ML IV SOLN
INTRAVENOUS | Status: DC | PRN
  Administered 2015-04-12: 200 mg via INTRAVENOUS

## 2015-04-12 MED ORDER — SUCCINYLCHOLINE CHLORIDE 20 MG/ML IJ SOLN
INTRAMUSCULAR | Status: AC
  Filled 2015-04-12: qty 1

## 2015-04-12 MED ORDER — HYDROCODONE-ACETAMINOPHEN 2.5-500 MG PO TABS: 1.0000 | ORAL_TABLET | ORAL | Status: DC | PRN

## 2015-04-12 MED ORDER — OXYCODONE HCL 5 MG/5ML PO SOLN: 5.0000 mg | Freq: Once | ORAL | Status: DC | PRN

## 2015-04-12 MED ORDER — OXYCODONE HCL 5 MG PO TABS: 5.0000 mg | ORAL_TABLET | Freq: Once | ORAL | Status: DC | PRN

## 2015-04-12 MED ORDER — PROPOFOL 10 MG/ML IV BOLUS
INTRAVENOUS | Status: AC
  Filled 2015-04-12: qty 20

## 2015-04-12 MED ORDER — ROCURONIUM BROMIDE 50 MG/5ML IV SOLN
INTRAVENOUS | Status: AC
  Filled 2015-04-12: qty 1

## 2015-04-12 MED ORDER — HYDROMORPHONE HCL 1 MG/ML IJ SOLN: 0.2500 mg | INTRAMUSCULAR | Status: DC | PRN

## 2015-04-12 MED ORDER — MIDAZOLAM HCL 2 MG/2ML IJ SOLN
INTRAMUSCULAR | Status: AC
  Filled 2015-04-12: qty 2

## 2015-04-12 MED ORDER — NEOSTIGMINE METHYLSULFATE 10 MG/10ML IV SOLN
INTRAVENOUS | Status: AC
  Filled 2015-04-12: qty 5

## 2015-04-12 MED ORDER — HYDROCODONE-ACETAMINOPHEN 5-325 MG PO TABS
1.0000 | ORAL_TABLET | Freq: Once | ORAL | Status: AC
  Administered 2015-04-12: 1 via ORAL

## 2015-04-12 MED ORDER — LIDOCAINE HCL (PF) 1 % IJ SOLN
INTRAMUSCULAR | Status: AC
  Filled 2015-04-12: qty 30

## 2015-04-12 MED ORDER — ROCURONIUM BROMIDE 100 MG/10ML IV SOLN
INTRAVENOUS | Status: DC | PRN
  Administered 2015-04-12: 20 mg via INTRAVENOUS
  Administered 2015-04-12: 40 mg via INTRAVENOUS

## 2015-04-12 MED ORDER — LIDOCAINE HCL (CARDIAC) 20 MG/ML IV SOLN
INTRAVENOUS | Status: AC
  Filled 2015-04-12: qty 10

## 2015-04-12 MED ORDER — FENTANYL CITRATE (PF) 250 MCG/5ML IJ SOLN
INTRAMUSCULAR | Status: AC
  Filled 2015-04-12: qty 5

## 2015-04-12 MED ORDER — HYDROCODONE-ACETAMINOPHEN 5-325 MG PO TABS
ORAL_TABLET | ORAL | Status: AC
  Filled 2015-04-12: qty 1

## 2015-04-12 MED ORDER — LACTATED RINGERS IV SOLN
INTRAVENOUS | Status: DC | PRN
  Administered 2015-04-12 (×2): via INTRAVENOUS

## 2015-04-12 MED ORDER — ONDANSETRON HCL 4 MG/2ML IJ SOLN: 4.0000 mg | Freq: Once | INTRAMUSCULAR | Status: DC | PRN

## 2015-04-12 MED ORDER — ONDANSETRON HCL 4 MG/2ML IJ SOLN
INTRAMUSCULAR | Status: AC
  Filled 2015-04-12: qty 2

## 2015-04-12 MED ORDER — 0.9 % SODIUM CHLORIDE (POUR BTL) OPTIME
TOPICAL | Status: DC | PRN
  Administered 2015-04-12 (×2): 1000 mL

## 2015-04-12 MED ORDER — SUGAMMADEX SODIUM 200 MG/2ML IV SOLN
INTRAVENOUS | Status: AC
  Filled 2015-04-12: qty 2

## 2015-04-12 MED ORDER — PHENYLEPHRINE HCL 10 MG/ML IJ SOLN
INTRAMUSCULAR | Status: DC | PRN
  Administered 2015-04-12: 80 ug via INTRAVENOUS
  Administered 2015-04-12: 120 ug via INTRAVENOUS
  Administered 2015-04-12 (×6): 80 ug via INTRAVENOUS

## 2015-04-12 MED ORDER — MIDAZOLAM HCL 5 MG/5ML IJ SOLN
INTRAMUSCULAR | Status: DC | PRN
  Administered 2015-04-12: 2 mg via INTRAVENOUS

## 2015-04-12 MED ORDER — LIDOCAINE HCL (CARDIAC) 20 MG/ML IV SOLN
INTRAVENOUS | Status: DC | PRN
  Administered 2015-04-12: 40 mg via INTRAVENOUS
  Administered 2015-04-12: 60 mg via INTRAVENOUS

## 2015-04-12 MED ORDER — HYDROCODONE-ACETAMINOPHEN 5-325 MG PO TABS: 1.0000 | ORAL_TABLET | Freq: Once | ORAL | Status: DC

## 2015-04-12 MED ORDER — FENTANYL CITRATE (PF) 100 MCG/2ML IJ SOLN
INTRAMUSCULAR | Status: DC | PRN
  Administered 2015-04-12: 100 ug via INTRAVENOUS
  Administered 2015-04-12 (×3): 50 ug via INTRAVENOUS

## 2015-04-12 SURGICAL SUPPLY — 54 items
BLADE SURG 10 STRL SS (BLADE) ×3 IMPLANT
BRUSH CYTOL CELLEBRITY 1.5X140 (MISCELLANEOUS) ×3 IMPLANT
CANISTER SUCTION 2500CC (MISCELLANEOUS) ×6 IMPLANT
CLIP TI MEDIUM 6 (CLIP) ×3 IMPLANT
CONT SPEC 4OZ CLIKSEAL STRL BL (MISCELLANEOUS) ×9 IMPLANT
COVER DOME SNAP 22 D (MISCELLANEOUS) ×3 IMPLANT
COVER SURGICAL LIGHT HANDLE (MISCELLANEOUS) ×3 IMPLANT
COVER TABLE BACK 60X90 (DRAPES) ×3 IMPLANT
DERMABOND ADVANCED (GAUZE/BANDAGES/DRESSINGS)
DERMABOND ADVANCED .7 DNX12 (GAUZE/BANDAGES/DRESSINGS) IMPLANT
DRAPE LAPAROTOMY T 102X78X121 (DRAPES) ×3 IMPLANT
DRSG AQUACEL AG ADV 3.5X14 (GAUZE/BANDAGES/DRESSINGS) IMPLANT
ELECT BLADE 6.5 EXT (BLADE) ×3 IMPLANT
ELECT CAUTERY BLADE 6.4 (BLADE) ×3 IMPLANT
ELECT REM PT RETURN 9FT ADLT (ELECTROSURGICAL) ×3
ELECTRODE REM PT RTRN 9FT ADLT (ELECTROSURGICAL) ×2 IMPLANT
FORCEPS BIOP RJ4 1.8 (CUTTING FORCEPS) ×3 IMPLANT
GAUZE SPONGE 4X4 12PLY STRL (GAUZE/BANDAGES/DRESSINGS) ×3 IMPLANT
GAUZE SPONGE 4X4 16PLY XRAY LF (GAUZE/BANDAGES/DRESSINGS) ×3 IMPLANT
GLOVE BIO SURGEON STRL SZ 6.5 (GLOVE) ×9 IMPLANT
GLOVE BIOGEL PI IND STRL 6.5 (GLOVE) ×8 IMPLANT
GLOVE BIOGEL PI INDICATOR 6.5 (GLOVE) ×4
GLOVE ECLIPSE 6.5 STRL STRAW (GLOVE) ×3 IMPLANT
GOWN STRL REUS W/ TWL LRG LVL3 (GOWN DISPOSABLE) ×10 IMPLANT
GOWN STRL REUS W/TWL LRG LVL3 (GOWN DISPOSABLE) ×5
HEMOSTAT SURGICEL 2X14 (HEMOSTASIS) IMPLANT
KIT BASIN OR (CUSTOM PROCEDURE TRAY) ×3 IMPLANT
KIT CLEAN ENDO COMPLIANCE (KITS) ×6 IMPLANT
KIT ROOM TURNOVER OR (KITS) ×3 IMPLANT
LIQUID BAND (GAUZE/BANDAGES/DRESSINGS) ×3 IMPLANT
MARKER SKIN DUAL TIP RULER LAB (MISCELLANEOUS) ×3 IMPLANT
NEEDLE BIOPSY TRANSBRONCH 21G (NEEDLE) IMPLANT
NEEDLE BLUNT 18X1 FOR OR ONLY (NEEDLE) IMPLANT
NEEDLE SONO TIP II EBUS (NEEDLE) ×3 IMPLANT
NS IRRIG 1000ML POUR BTL (IV SOLUTION) ×6 IMPLANT
OIL SILICONE PENTAX (PARTS (SERVICE/REPAIRS)) ×3 IMPLANT
PACK SURGICAL SETUP 50X90 (CUSTOM PROCEDURE TRAY) ×3 IMPLANT
PAD ARMBOARD 7.5X6 YLW CONV (MISCELLANEOUS) ×6 IMPLANT
PENCIL BUTTON HOLSTER BLD 10FT (ELECTRODE) ×3 IMPLANT
SPONGE INTESTINAL PEANUT (DISPOSABLE) ×6 IMPLANT
STAPLER VISISTAT 35W (STAPLE) IMPLANT
SUT VIC AB 3-0 SH 18 (SUTURE) ×3 IMPLANT
SUT VICRYL 4-0 PS2 18IN ABS (SUTURE) ×3 IMPLANT
SWAB COLLECTION DEVICE MRSA (MISCELLANEOUS) IMPLANT
SYR 20CC LL (SYRINGE) ×3 IMPLANT
SYR 20ML ECCENTRIC (SYRINGE) ×3 IMPLANT
SYRINGE 10CC LL (SYRINGE) ×3 IMPLANT
TOWEL OR 17X24 6PK STRL BLUE (TOWEL DISPOSABLE) ×6 IMPLANT
TOWEL OR 17X26 10 PK STRL BLUE (TOWEL DISPOSABLE) ×3 IMPLANT
TRAP SPECIMEN MUCOUS 40CC (MISCELLANEOUS) ×3 IMPLANT
TUBE ANAEROBIC SPECIMEN COL (MISCELLANEOUS) IMPLANT
TUBE CONNECTING 12X1/4 (SUCTIONS) ×3 IMPLANT
TUBE CONNECTING 20X1/4 (TUBING) ×3 IMPLANT
WATER STERILE IRR 1000ML POUR (IV SOLUTION) ×3 IMPLANT

## 2015-04-12 NOTE — Anesthesia Postprocedure Evaluation (Signed)
Anesthesia Post Note  Patient: Amber Santiago  Procedure(s) Performed: Procedure(s) (LRB): VIDEO BRONCHOSCOPY WITH ENDOBRONCHIAL ULTRASOUND (N/A) MEDIASTINOSCOPY (N/A)  Patient location during evaluation: PACU Anesthesia Type: General Level of consciousness: awake and awake and alert Pain management: pain level controlled Vital Signs Assessment: post-procedure vital signs reviewed and stable Respiratory status: spontaneous breathing and nonlabored ventilation Anesthetic complications: no    Last Vitals:  Filed Vitals:   04/12/15 1300 04/12/15 1315  BP: 146/62 140/68  Pulse: 73 74  Temp:  36.5 C  Resp: 16 16    Last Pain:  Filed Vitals:   04/12/15 1347  PainSc: 5                  Amber Santiago

## 2015-04-12 NOTE — Telephone Encounter (Signed)
Oncology Nurse Navigator Documentation  Oncology Nurse Navigator Flowsheets 04/12/2015  Navigator Encounter Type Telephone/I called and spoke with Mr. Amber Santiago today.  I gave him appt for his wife to see Dr. Julien Nordmann on 04/15/15 arrive at 9:00 with labs and appt with Dr. Julien Nordmann.  He verbalized understanding of appt time and place.   Patient Visit Type Follow-up  Interventions Coordination of Care  Coordination of Care MD Appointments  Education Method -  Time Spent with Patient 15

## 2015-04-12 NOTE — Anesthesia Procedure Notes (Signed)
Procedure Name: Intubation Date/Time: 04/12/2015 7:32 AM Performed by: Mariea Clonts Pre-anesthesia Checklist: Patient identified, Patient being monitored, Emergency Drugs available, Timeout performed and Suction available Patient Re-evaluated:Patient Re-evaluated prior to inductionOxygen Delivery Method: Circle system utilized Preoxygenation: Pre-oxygenation with 100% oxygen Intubation Type: IV induction Ventilation: Mask ventilation without difficulty and Oral airway inserted - appropriate to patient size Laryngoscope Size: Sabra Heck and 2 Grade View: Grade II Tube type: Oral Tube size: 8.0 mm Number of attempts: 1 Placement Confirmation: ETT inserted through vocal cords under direct vision,  breath sounds checked- equal and bilateral and positive ETCO2 Tube secured with: Tape Dental Injury: Teeth and Oropharynx as per pre-operative assessment

## 2015-04-12 NOTE — H&P (Signed)
[address]       [place] [address]             [phone]                    Amber Santiago Medical Record #[id number] Date of Birth: 1946-02-26  Referring: No ref. provider found Primary Care: Pcp Not In System  Chief Complaint:   No chief complaint on file.   History of Present Illness:    Amber Santiago 70 y.o. female is seen in the office  today for dx of non small cell cancer of lung. She is long term current smoker (30 pk-yrs), hx of HTN, referred to pulmonary for COPD and for small pulm nodules seen on CT scan chest 03/10/13.  as an outpt. Was given O2 at that time but not currenly using She is active, is able to exert but paces herself. Wheezes occasionally.  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  06/2013 pfts, FEV1 .83 36%  Current Activity/ Functional Status:  Patient is independent with mobility/ambulation, transfers, ADL's, IADL's.   Zubrod Score: At the time of surgery this patient's most appropriate activity status/level should be described as: '[]'$     0    Normal activity, no symptoms '[]'$     1    Restricted in physical strenuous activity but ambulatory, able to do out light work '[x]'$     2    Ambulatory and capable of self care, unable to do work activities, up and about               >50 % of waking hours                              '[]'$     3    Only limited self care, in bed greater than 50% of waking hours '[]'$     4    Completely disabled, no self care, confined to bed or chair '[]'$     5    Moribund   Past Medical History  Diagnosis Date  . COPD (chronic obstructive pulmonary disease) ([place])   . Asthma   . Hypertension   . Lung nodule   . Pneumonia   . Hyperthyroidism     PMH  . Umbilical hernia   . Arthritis   . Cancer ([place])     right lung  . Constipation   . 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 Video bronchoscopy Bilateral 03/18/2015    Procedure: VIDEO BRONCHOSCOPY WITH FLUORO;  Surgeon: Collene Gobble, MD;  Location: [facility];  Service: Cardiopulmonary;  Laterality: Bilateral;  . Appendectomy    . Dilation and curettage of uterus      No family history on file.  Social History   Social History  . Marital Status: Married    Spouse Name: N/A  . Number of Children: N/A  . Years of Education: N/A   Occupational History  . retired    Social History Main Topics  . Smoking status: Current Every Day Smoker -- 1.50 packs/day for 30 years    Types: Cigarettes  . Smokeless tobacco: Never Used  . Alcohol Use: No  . Drug Use: No  . Sexual Activity: Not on file  Other Topics Concern  . Not on file   Social History Narrative    History  Smoking status  . Current Every Day Smoker -- 1.50 packs/day for 30 years  . Types: Cigarettes  Smokeless tobacco  . Never Used    History  Alcohol Use No     Allergies  Allergen Reactions  . Amoxicillin Anaphylaxis  . Penicillins Anaphylaxis    Current Facility-Administered Medications  Medication Dose Route Frequency Provider Last Rate Last Dose  . chlorhexidine (HIBICLENS) 4 % liquid 4 application  60 mL Topical Once Grace Isaac, MD       And  . chlorhexidine (HIBICLENS) 4 % liquid 4 application  60 mL Topical Once Grace Isaac, MD      . vancomycin (VANCOCIN) IVPB 1000 mg/200 mL premix  1,000 mg Intravenous To SS-Surg Grace Isaac, MD         Review of Systems:     Cardiac Review of Systems: Y or N  Chest Pain [  N  ]  Resting SOB [ Y  ] Exertional SOB  [Y  ]  Orthopnea N  ]   Pedal Edema [  N ]    Palpitations Aqua.Slicker  ] Syncope  [  N]   Presyncope Amber Santiago   ]  General Review of Systems: [Y] = yes [  ]=no Constitional: recent weight change [  ];  Wt loss over the last 3 months [9-10 LBS  ] anorexia [  ]; fatigue [  ]; nausea [  ]; night sweats [  ]; fever [  ]; or chills [  ];          Dental:  poor dentition[  ]; Last Dentist visit:   Eye : blurred vision [  ]; diplopia [   ]; vision changes [  ];  Amaurosis fugax[  ]; Resp: cough [ Y ];  wheezing[  ];  hemoptysis[  ]; shortness of breath[ Y ]; paroxysmal nocturnal dyspnea[Y  ]; dyspnea on exertion[  ]; or orthopnea[  ];  GI:  gallstones[  ], vomiting[  ];  dysphagia[  ]; melena[  ];  hematochezia [  ]; heartburn[  ];   Hx of  Colonoscopy[  ]; GU: kidney stones [  ]; hematuria[  ];   dysuria [  ];  nocturia[  ];  history of     obstruction [  ]; urinary frequency [  ]             Skin: rash, swelling[  ];, hair loss[  ];  peripheral edema[  ];  or itching[  ]; Musculosketetal: myalgias[  ];  joint swelling[  ];  joint erythema[  ];  joint pain[  ];  back pain[  ];  Heme/Lymph: bruising[  ];  bleeding[  ];  anemia[  ];  Neuro: TIA[ N ];  headaches[  ];  stroke[  N];  vertigo[  ];  seizures[  ];   paresthesias[  ];  difficulty walking[Y  ];  Psych:depression[  ]; anxiety[  ];  Endocrine: diabetes[ N ];  thyroid dysfunction[  ];  Immunizations: Flu up to date Aqua.Slicker  ]; Pneumococcal up to date [ N ];  Other:  Physical Exam: BP 161/71 mmHg  Pulse 74  Resp 20  Ht '5\' 4"'$  (1.626 m)  Wt 138 lb (62.596 kg)  BMI 23.68 kg/m2  SpO2 98%  PHYSICAL EXAMINATION: General appearance: alert and cooperative Head: Normocephalic, without obvious abnormality, atraumatic  Neck: no adenopathy, no carotid bruit, no JVD, supple, symmetrical, trachea midline and thyroid not enlarged, symmetric, no tenderness/mass/nodules Lymph nodes: Cervical, supraclavicular, and axillary nodes normal. Resp: diminished breath sounds bibasilar Back: symmetric, no curvature. ROM normal. No CVA tenderness. Cardio: regular rate and rhythm, S1, S2 normal, no murmur, click, rub or gallop GI: soft, non-tender; bowel sounds normal; no masses,  no organomegaly Extremities: extremities normal, atraumatic, no cyanosis or edema Neurologic: Grossly normal  Partial  blindness  Diagnostic Studies & Laboratory data: CLINICAL DATA: Followup pulmonary nodules.  EXAM: CT CHEST WITHOUT CONTRAST  TECHNIQUE: Multidetector CT imaging of the chest was performed following the standard protocol without IV contrast.  COMPARISON: Multiple prior chest CTs. The most recent is 02/15/2015  FINDINGS: Mediastinum/Nodes: No supraclavicular or axillary mass or adenopathy. Small stable scattered fatty lymph nodes are noted. No breast masses. The thyroid gland is grossly normal.  The heart is normal in size. No pericardial effusion.  The aorta is normal in caliber. Stable scattered atherosclerotic calcifications. Stable coronary artery calcifications.  The esophagus is grossly normal.  Stable small scattered mediastinal and hilar lymph nodes. No definite adenopathy.  Lungs/Pleura: Stable dense biapical pleural and parenchymal scarring changes. Stable paraseptal emphysema.  There is a 4 x 3 cm spiculated right upper lobe lung mass surrounding the right upper lobe bronchi. This is worrisome for primary lung neoplasm. In retrospect this may have been present on the prior study but was small and difficult to see between the branching vessels. Probable enlarged right hilar nodes but difficult to see without IV contrast. Recommend PET-CT for further evaluation and staging.  Stable small scattered sub 4 mm pulmonary nodules are unchanged and likely benign. No pleural effusion.  Upper abdomen: No significant findings. No adrenal gland or hepatic lesions without contrast.  Musculoskeletal: No significant bony findings. No worrisome bone lesions or spinal canal compromise. Osteoporosis and scattered hemangiomas are noted in the spine.  IMPRESSION: 1. 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 Probable enlarged right hilar lymph nodes but difficult to be certain without contrast. Recommend PET-CT for further  evaluation and staging. 2. Stable small scattered sub 4 mm pulmonary nodules, likely benign. 3. Stable dense apical pleural and parenchymal scarring changes. 4. Stable emphysematous changes. 5. No significant bony findings. These results will be called to the ordering clinician or representative by the Radiologist Assistant, and communication documented in the PACS or zVision Dashboard.   Electronically Signed  By: Marijo Sanes M.D.  On: 02/15/2015 17:24   CLINICAL DATA: Initial treatment strategy for spiculated right lung mass.  EXAM: NUCLEAR MEDICINE PET SKULL BASE TO THIGH  TECHNIQUE: 8.3 mCi F-18 FDG was injected intravenously. Full-ring PET imaging was performed from the skull base to thigh after the radiotracer. CT data was obtained and used for attenuation correction and anatomic localization.  FASTING BLOOD GLUCOSE: Value: 99 mg/dl  COMPARISON: CT scan from 02/15/2015  FINDINGS: NECK  No hypermetabolic lymph nodes in the neck.  CHEST  4.0 x 3.0 cm right upper lobe spiculated lung mass seen on the recent diagnostic chest CT is markedly hypermetabolic with SUV max = 27 since the recent CT scan, there has been development of a a satellite nodule measuring 9 mm (image 58 series 4) with SUV max = 3.9  Although no lymphadenopathy is seen in the right hilum or mediastinum by size criteria on the uninfused CT images, there is FDG uptake associated with a precarinal lymph node measuring 6  mm short axis (image 66 series 4) and AP window lymph node also 6 mm in short axis (image 65 series 4). Uptake in these lymph nodes is scattered just above mediastinal levels. Microscopic disease within these lymph nodes cannot be excluded.  Pleural-parenchymal scarring is seen in both lung apices without hypermetabolism. 4 mm left upper lobe nodule on image 59 is stable in the interval with no definite hypermetabolism on PET imaging although the small size may  be below the size threshold for reliable resolution on PET imaging. No evidence for pleural effusion.  ABDOMEN/PELVIS  No abnormal hypermetabolic activity within the liver, pancreas, adrenal glands, or spleen. No hypermetabolic lymph nodes in the abdomen or pelvis.  There is abdominal aortic atherosclerosis without aneurysm.  SKELETON  No focal hypermetabolic activity to suggest skeletal metastasis.  IMPRESSION: 4 cm spiculated central right upper lobe mass is markedly hypermetabolic consistent with malignancy. There is been interval development of a satellite nodule which is also hypermetabolic suggesting metastatic disease to the right lung.  Low level FDG uptake in unenlarged right hilar and mediastinal lymph nodes. Microscopic disease is a possibility.  No evidence for hypermetabolic metastases in the neck, abdomen, or pelvis.  Abdominal aortic atherosclerosis.   Electronically Signed  By: Misty Stanley M.D.  On: 02/25/2015 15:14   Recent Radiology Findings:   Dg Chest 2 View  04/12/2015  CLINICAL DATA:  Anterior lower rib pain, acute onset. Initial encounter. EXAM: CHEST  2 VIEW COMPARISON:  Chest radiograph from 03/18/2015 FINDINGS: The lungs are hyperexpanded, with flattening of the hemidiaphragms, compatible with COPD. The right suprahilar mass is again noted, measuring perhaps 4.8 cm, with biapical scarring. There is no evidence of pleural effusion or pneumothorax. The heart is normal in size; the mediastinal contour is within normal limits. No acute osseous abnormalities are seen. IMPRESSION: Right suprahilar mass again noted.  Underlying findings of COPD. Electronically Signed   By: Garald Balding M.D.   On: 04/12/2015 06:20   Mr Jeri Cos TD Contrast  04/08/2015  CLINICAL DATA:  Squamous cell lung cancer, right.  Staging. EXAM: MRI HEAD WITHOUT AND WITH CONTRAST TECHNIQUE: Multiplanar, multiecho pulse sequences of the brain and surrounding structures  were obtained without and with intravenous contrast. CONTRAST:  24m MULTIHANCE GADOBENATE DIMEGLUMINE 529 MG/ML IV SOLN COMPARISON:  None. FINDINGS: There is no evidence of acute infarct, intracranial hemorrhage, mass, midline shift, or extra-axial fluid collection. Mild generalized cerebral atrophy is within normal limits for age. Small foci of T2 hyperintensity in the subcortical and deep cerebral white matter bilaterally are nonspecific but compatible with mild chronic small vessel ischemic disease. A chronic lacunar infarct is noted in the left caudate nucleus. No abnormal enhancement is identified. Orbits are unremarkable. Paranasal sinuses are clear. There are left middle ear and left mastoid effusions. Major intracranial vascular flow voids are preserved. No suspicious skull lesions are identified. IMPRESSION: 1. No evidence of intracranial metastases. 2. Mild chronic small vessel ischemic disease. 3. Left middle ear and left mastoid effusions. Electronically Signed   By: ALogan BoresM.D.   On: 04/08/2015 16:17   I have independently reviewed the above radiologic studies.  Recent Lab Findings: Lab Results  Component Value Date   WBC 9.9 04/06/2015   HGB 13.4 04/06/2015   HCT 40.4 04/06/2015   PLT 270 04/06/2015   GLUCOSE 113* 04/06/2015   ALT 17 04/06/2015   AST 33 04/06/2015   NA 137 04/06/2015   K 4.3 04/06/2015   CL 99* 04/06/2015  CREATININE 0.61 04/06/2015   BUN 6 04/06/2015   CO2 29 04/06/2015   INR 1.05 04/06/2015   PFT's  1.17 52% with dilators 1.09 48% DLCO 16.3 67%    Diagnosis WANG NEEDLE ,FINE NEEDLE ASPIRATION, RUL, (SPECIMEN 3 OF 3, COLLECTED ON 03/18/2015): MALIGNANT CELLS PRESENT, CONSISTENT WITH SQUAMOUS CELL CARCINOMA. SEE COMMENT. COMMENT: THERE IS MINIMAL TUMOR PRESENT. DR. Herbie Baltimore HILLARD HAS REVIEWED THE CASE   Assessment / Plan:   There is no additional tissue for further testing or evaluation of nodes Limited pulmonary reserve, FEV1 .83 in 2015,  some improvement ,on recent PFT's Possibility of Stage IIIa disease ct3cn1 vs n2 disease  I have recommended proceeding with repeat bronch, EBUS and mediastinoscopy,   The goals risks and alternatives of the planned surgical procedure bronchoscopy, EBUS and mediastinoscopy have been discussed with the patient in detail. The risks of the procedure including death, infection, stroke, myocardial infarction, bleeding, blood transfusion have all been discussed specifically.  I have quoted Amber Santiago a 2 % of perioperative mortality and a complication rate as high as 10 %. The patient's questions have been answered.Amber Santiago is willing  to proceed with the planned procedure. Grace Isaac MD      [address] [place] [address] Office [phone]   Beeper [phone]  04/12/2015 7:17 AM

## 2015-04-12 NOTE — Anesthesia Preprocedure Evaluation (Signed)
Anesthesia Evaluation  Patient identified by MRN, date of birth, ID band Patient awake    Reviewed: Allergy & Precautions, NPO status , Patient's Chart, lab work & pertinent test results  Airway Mallampati: II  TM Distance: >3 FB Neck ROM: Full    Dental  (+) Edentulous Upper, Edentulous Lower   Pulmonary Current Smoker,     + decreased breath sounds      Cardiovascular hypertension,  Rhythm:Regular Rate:Normal     Neuro/Psych    GI/Hepatic   Endo/Other    Renal/GU      Musculoskeletal   Abdominal   Peds  Hematology   Anesthesia Other Findings   Reproductive/Obstetrics                             Anesthesia Physical Anesthesia Plan  ASA: III  Anesthesia Plan: General   Post-op Pain Management:    Induction: Intravenous  Airway Management Planned: Oral ETT  Additional Equipment:   Intra-op Plan:   Post-operative Plan: Extubation in OR  Informed Consent: I have reviewed the patients History and Physical, chart, labs and discussed the procedure including the risks, benefits and alternatives for the proposed anesthesia with the patient or authorized representative who has indicated his/her understanding and acceptance.     Plan Discussed with: CRNA and Anesthesiologist  Anesthesia Plan Comments:         Anesthesia Quick Evaluation

## 2015-04-12 NOTE — Transfer of Care (Signed)
Immediate Anesthesia Transfer of Care Note  Patient: Amber Santiago  Procedure(s) Performed: Procedure(s): VIDEO BRONCHOSCOPY WITH ENDOBRONCHIAL ULTRASOUND (N/A) MEDIASTINOSCOPY (N/A)  Patient Location: PACU  Anesthesia Type:General  Level of Consciousness: awake, alert  and oriented  Airway & Oxygen Therapy: Patient Spontanous Breathing and Patient connected to nasal cannula oxygen  Post-op Assessment: Report given to RN and Post -op Vital signs reviewed and stable  Post vital signs: Reviewed and stable  Last Vitals:  Filed Vitals:   04/12/15 1115 04/12/15 1130  BP: 133/61 151/65  Pulse: 72 78  Temp:    Resp: 17 14    Complications: No apparent anesthesia complications

## 2015-04-12 NOTE — Brief Op Note (Signed)
      [address]       [place] [address]             [phone]      04/12/2015  10:40 AM  PATIENT:  Amber Santiago  70 y.o. female  PRE-OPERATIVE DIAGNOSIS:  RIGHT LUNG MASS  POST-OPERATIVE DIAGNOSIS:  RIGHT LUNG MASS  PROCEDURE:  Procedure(s): VIDEO BRONCHOSCOPY WITH ENDOBRONCHIAL ULTRASOUND (N/A) MEDIASTINOSCOPY (N/A) With lymph node biopsy and biopsy of right upper lobe   SURGEON:  Surgeon(s) and Role:    * Grace Isaac, MD - Primary   ANESTHESIA:   general  EBL:  Total I/O In: 1000 [I.V.:1000] Out: -   BLOOD ADMINISTERED:none  DRAINS: none   LOCAL MEDICATIONS USED:  NONE  SPECIMEN:  Source of Specimen:  right upper lobe, 4r nodes 7 node  DISPOSITION OF SPECIMEN:  PATHOLOGY  COUNTS:  YES   DICTATION: .Dragon Dictation  PLAN OF CARE: Discharge to home after PACU  PATIENT DISPOSITION:  PACU - hemodynamically stable.   Delay start of Pharmacological VTE agent (>24hrs) due to surgical blood loss or risk of bleeding: yes

## 2015-04-12 NOTE — Discharge Instructions (Signed)
Flexible Bronchoscopy, Care After Refer to this sheet in the next few weeks. These instructions provide you with information on caring for yourself after your procedure. Your health care provider may also give you more specific instructions. Your treatment has been planned according to current medical practices, but problems sometimes occur. Call your health care provider if you have any problems or questions after your procedure.  WHAT TO EXPECT AFTER THE PROCEDURE It is normal to have the following symptoms for 24-48 hours after the procedure:   Increased cough.  Low-grade fever.  Sore throat or hoarse voice.  Small streaks of blood in your thick spit (sputum) if tissue samples were taken (biopsy). HOME CARE INSTRUCTIONS   Do not eat or drink anything for 2 hours after your procedure. Your nose and throat were numbed by medicine. If you try to eat or drink before the medicine wears off, food or drink could go into your lungs or you could burn yourself. After the numbness is gone and your cough and gag reflexes have returned, you may eat soft food and drink liquids slowly.   The day after the procedure, you can go back to your normal diet.   You may resume normal activities.   Keep all follow-up visits as directed by your health care provider. It is important to keep all your appointments, especially if tissue samples were taken for testing (biopsy). SEEK IMMEDIATE MEDICAL CARE IF:   You have increasing shortness of breath.   You become light-headed or faint.   You have chest pain.   You have any new concerning symptoms.  You cough up more than a small amount of blood.  The amount of blood you cough up increases. MAKE SURE YOU:  Understand these instructions.  Will watch your condition.  Will get help right away if you are not doing well or get worse.   This information is not intended to replace advice given to you by your health care provider. Make sure you discuss  any questions you have with your health care provider.   Document Released: 10/13/2004 Document Revised: 04/16/2014 Document Reviewed: 11/28/2012 Elsevier Interactive Patient Education [employer].

## 2015-04-13 ENCOUNTER — Encounter (HOSPITAL_COMMUNITY): Payer: Self-pay | Admitting: Cardiothoracic Surgery

## 2015-04-14 NOTE — Op Note (Signed)
NAMEZEENAT, JEANBAPTISTE NO.:  [id number]  MEDICAL RECORD NO.:  [id number]  LOCATION:  [facility]                         FACILITY:  [facility]  PHYSICIAN:  Lanelle Bal, MD    DATE OF BIRTH:  April 09, 1946  DATE OF PROCEDURE:  04/12/2015 DATE OF DISCHARGE:  04/12/2015                              OPERATIVE REPORT   PREOPERATIVE DIAGNOSIS:  Right upper lobe lung mass with question of mediastinal adenopathy.  POSTOPERATIVE DIAGNOSIS:  Right upper lobe lung mass with question of mediastinal adenopathy.  SURGICAL PROCEDURE:  Bronchoscopy with biopsy, right upper lobe. Endobronchial ultrasound with transbronchial biopsy of 4R and 7 lymph nodes.  Mediastinoscopy with biopsy of mediastinal lymph nodes.  SURGEON:  Lanelle Bal, M.D.  BRIEF HISTORY:  The patient is a 70 year old female, with significant pulmonary disease with FEV1 of 28-1 at times, she has been on chronic home oxygen, who presented to the Pulmonary Service with the right upper lobe lung mass.  A transbronchial biopsy was performed in early December, demonstrating squamous cell carcinoma, but very limited tissue was available and no evaluation of the mediastinal nodes.  The patient was seen in the [facility] and it was decided to proceed with further evaluation of her mediastinal nodes.  It was deemed that she was not an operative resection candidate, consideration for radiation therapy plus minus chemotherapy was discussed with her.  We proceeded with this with further evaluation of her mediastinal nodes.  DESCRIPTION OF PROCEDURE:  After appropriate time-out, the patient underwent general endotracheal anesthesia without incident, through a single-lumen endotracheal tube and after appropriate time-out was performed, a fiberoptic bronchoscope was passed to the subsegmental level, both right and left tracheobronchial tree.  In the right upper lobe bronchus at the division between the anterior and  posterior subsegments, there were some narrowing of the bronchus in this area, but no definite endobronchial lesion.  The scope was then removed and an EBUS scope was placed through this.  We were able to identify an area of 7 and 4R nodes, multiple passes at the 7R node area and 4R node area were done and submitted to Pathology.  On trying to maximize what tissue was obtained for possible genetic testing, on initial smear of the 7 node was lymphoid tissue, but without evidence of malignancy, the 4R node was with inadequate specimen.  We then removed the EBUS scope with the standard video bronchoscope tuck biopsies in the right upper lobe bronchus.  We then decided to proceed with formal mediastinoscopy as previously discussed with the patient.  The neck and chest were prepped with Betadine and draped in a sterile manner.  Transverse incision made in the suprasternal notch and dissection was carried down to the pretracheal fascia, which was bluntly dissected down into the mediastinum.  A video mediastinoscope was then introduced into the space and we dissected along the right paratracheal area and identified the 4R nodes, which were biopsied and submitted to Pathology.  Additional nodal tissue and 7 area were also obtained and submitted as multiple fragments to Pathology with the operative field hemostatic, the scope was then removed.  An incision was closed with interrupted 3-0 Vicryl and a 4-0 subcuticular  stitch.  Dermabond was applied.  Sponge and needle count was reported as correct at the completion of the procedure.  The patient tolerated the procedure without obvious complication.  She was extubated in the operating room and transferred to the recovery room for further postoperative care.     Lanelle Bal, MD     EG/MEDQ  D:  04/13/2015  T:  04/13/2015  Job:  [id number]

## 2015-04-15 ENCOUNTER — Telehealth: Payer: Self-pay | Admitting: Internal Medicine

## 2015-04-15 ENCOUNTER — Other Ambulatory Visit (HOSPITAL_BASED_OUTPATIENT_CLINIC_OR_DEPARTMENT_OTHER): Payer: Managed Care, Other (non HMO)

## 2015-04-15 ENCOUNTER — Encounter: Payer: Self-pay | Admitting: Internal Medicine

## 2015-04-15 ENCOUNTER — Ambulatory Visit (HOSPITAL_BASED_OUTPATIENT_CLINIC_OR_DEPARTMENT_OTHER): Payer: Managed Care, Other (non HMO) | Admitting: Internal Medicine

## 2015-04-15 VITALS — BP 148/82 | HR 70 | Temp 96.0°F | Resp 18 | Ht 64.0 in | Wt 137.1 lb

## 2015-04-15 DIAGNOSIS — C3411 Malignant neoplasm of upper lobe, right bronchus or lung: Secondary | ICD-10-CM

## 2015-04-15 DIAGNOSIS — C3491 Malignant neoplasm of unspecified part of right bronchus or lung: Secondary | ICD-10-CM

## 2015-04-15 LAB — COMPREHENSIVE METABOLIC PANEL
ALBUMIN: 3.6 g/dL (ref 3.5–5.0)
ALK PHOS: 91 U/L (ref 40–150)
ALT: 12 U/L (ref 0–55)
AST: 30 U/L (ref 5–34)
Anion Gap: 9 mEq/L (ref 3–11)
BILIRUBIN TOTAL: 0.48 mg/dL (ref 0.20–1.20)
BUN: 9 mg/dL (ref 7.0–26.0)
CO2: 29 meq/L (ref 22–29)
Calcium: 9.3 mg/dL (ref 8.4–10.4)
Chloride: 101 mEq/L (ref 98–109)
Creatinine: 0.7 mg/dL (ref 0.6–1.1)
EGFR: 86 mL/min/{1.73_m2} — ABNORMAL LOW (ref >90)
GLUCOSE: 106 mg/dL (ref 70–140)
Potassium: 3.8 mEq/L (ref 3.5–5.1)
SODIUM: 138 meq/L (ref 136–145)
TOTAL PROTEIN: 7 g/dL (ref 6.4–8.3)

## 2015-04-15 LAB — CBC WITH DIFFERENTIAL/PLATELET
BASO%: 1.1 % (ref 0.0–2.0)
Basophils Absolute: 0.1 10*3/uL (ref 0.0–0.1)
EOS ABS: 0.1 10*3/uL (ref 0.0–0.5)
EOS%: 1.9 % (ref 0.0–7.0)
HCT: 41.1 % (ref 34.8–46.6)
HEMOGLOBIN: 13.4 g/dL (ref 11.6–15.9)
LYMPH%: 26.8 % (ref 14.0–49.7)
MCH: 29.2 pg (ref 25.1–34.0)
MCHC: 32.5 g/dL (ref 31.5–36.0)
MCV: 89.8 fL (ref 79.5–101.0)
MONO#: 0.5 10*3/uL (ref 0.1–0.9)
MONO%: 6.7 % (ref 0.0–14.0)
NEUT%: 63.5 % (ref 38.4–76.8)
NEUTROS ABS: 4.4 10*3/uL (ref 1.5–6.5)
Platelets: 222 10*3/uL (ref 145–400)
RBC: 4.57 10*6/uL (ref 3.70–5.45)
RDW: 14.2 % (ref 11.2–14.5)
WBC: 6.9 10*3/uL (ref 3.9–10.3)
lymph#: 1.8 10*3/uL (ref 0.9–3.3)

## 2015-04-15 NOTE — Progress Notes (Signed)
[place] Telephone:(336) [phone]   Fax:(336) [phone]  OFFICE PROGRESS NOTE  Pcp Not In System No address on file  DIAGNOSIS: Stage IIB (T3, N0, M0) non-small cell lung cancer, squamous cell carcinoma diagnosed in December 2016 and presenting with large right upper lobe lung mass in addition to a satellite nodule.  PRIOR THERAPY: None  CURRENT THERAPY: None  INTERVAL HISTORY: Amber Santiago 70 y.o. female returns to the clinic today for visit accompanied by her husband and daughter. The patient is feeling fine today but very anxious. She was seen recently by Dr. Servando Snare and the patient underwent repeat bronchoscopy, endobronchial ultrasound and mediastinoscopy for evaluation of the hilar and mediastinal lymph nodes. The final pathology and cytology showed no evidence of metastatic disease to the hilar or mediastinal lymph nodes. The patient also had MRI of the brain performed recently that showed no evidence for metastatic disease to the brain. She denied having any significant complaints except for cough and mild shortness of breath. She has no fever or chills. She has mild nausea today. She denied having any significant weight loss or night sweats. She has no headache or visual changes. The patient is here today for evaluation and discussion of her treatment options based on the recent results.  MEDICAL HISTORY: Past Medical History  Diagnosis Date  . COPD (chronic obstructive pulmonary disease) ([place])   . Asthma   . Hypertension   . Lung nodule   . Pneumonia   . Hyperthyroidism     PMH  . Umbilical hernia   . Arthritis   . Cancer ([place])     right lung  . Constipation   . 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  MEDICATIONS:  Current Outpatient Prescriptions  Medication Sig Dispense Refill  . Acetaminophen (TYLENOL) 325 MG CAPS Take 500 mg by mouth every 6 (six) hours as needed.    .  Ascorbic Acid (VITAMIN C) 1000 MG tablet Take 1,000 mg by mouth daily.    . bevacizumab (AVASTIN) 1.25 mg/0.1 mL SOLN 1.25 mg by Intravitreal route to Surgery.    . budesonide-formoterol (SYMBICORT) 160-4.5 MCG/ACT inhaler INHALE TWO PUFFS INTO LUNGS TWICE DAILY 11 g 11  . cholecalciferol (VITAMIN D) 1000 UNITS tablet Take 2,000 Units by mouth daily.    . Cyanocobalamin (B-12 PO) Take 1 tablet by mouth daily. Unsure of dosage    . GARLIC PO Take 1 capsule by mouth daily.    Marland Kitchen HYDROcodone-acetaminophen (VICODIN) 2.5-500 MG tablet Take 1 tablet by mouth every 4 (four) hours as needed for pain. 15 tablet 0  . LORazepam (ATIVAN) 0.5 MG tablet Take 1 tablet (0.5 mg total) by mouth 2 (two) times daily. 10 tablet 0  . Multiple Vitamins-Minerals (PRESERVISION AREDS 2 PO) Take 1 capsule by mouth daily.    . Pyridoxine HCl (B-6 PO) Take 1 tablet by mouth daily. Unsure of dosage.    Marland Kitchen Spacer/Aero-Holding Chambers (AEROCHAMBER MV) inhaler Use as instructed 1 each 0  . TURMERIC CURCUMIN PO Take 1 capsule by mouth daily.     No current facility-administered medications for this visit.    SURGICAL HISTORY:  Past Surgical History  Procedure Laterality Date  . Video bronchoscopy Bilateral 03/18/2015    Procedure: VIDEO BRONCHOSCOPY WITH FLUORO;  Surgeon: Collene Gobble, MD;  Location: [facility];  Service: Cardiopulmonary;  Laterality: Bilateral;  . Appendectomy    . Dilation and curettage  of uterus    . Video bronchoscopy with endobronchial ultrasound N/A 04/12/2015    Procedure: VIDEO BRONCHOSCOPY WITH ENDOBRONCHIAL ULTRASOUND;  Surgeon: Grace Isaac, MD;  Location: [facility];  Service: Thoracic;  Laterality: N/A;  . Mediastinoscopy N/A 04/12/2015    Procedure: MEDIASTINOSCOPY;  Surgeon: Grace Isaac, MD;  Location: [place];  Service: Thoracic;  Laterality: N/A;    REVIEW OF SYSTEMS:  Constitutional: positive for anorexia and fatigue Eyes: negative Ears, nose, mouth, throat, and face:  negative Respiratory: positive for cough and dyspnea on exertion Cardiovascular: negative Gastrointestinal: positive for nausea Genitourinary:negative Integument/breast: negative Hematologic/lymphatic: negative Musculoskeletal:negative Neurological: negative Behavioral/Psych: negative Endocrine: negative Allergic/Immunologic: negative   PHYSICAL EXAMINATION: General appearance: alert, cooperative, fatigued and no distress Head: Normocephalic, without obvious abnormality, atraumatic Neck: no adenopathy, no JVD, supple, symmetrical, trachea midline and thyroid not enlarged, symmetric, no tenderness/mass/nodules Lymph nodes: Cervical, supraclavicular, and axillary nodes normal. Resp: wheezes bilaterally Back: symmetric, no curvature. ROM normal. No CVA tenderness. Cardio: regular rate and rhythm, S1, S2 normal, no murmur, click, rub or gallop GI: soft, non-tender; bowel sounds normal; no masses,  no organomegaly Extremities: extremities normal, atraumatic, no cyanosis or edema Neurologic: Alert and oriented X 3, normal strength and tone. Normal symmetric reflexes. Normal coordination and gait  ECOG PERFORMANCE STATUS: 1 - Symptomatic but completely ambulatory  Blood pressure 148/82, pulse 70, temperature 96 F (35.6 C), temperature source Oral, resp. rate 18, height '5\' 4"'$  (1.626 m), weight 137 lb 1.6 oz (62.188 kg), SpO2 95 %.  LABORATORY DATA: Lab Results  Component Value Date   WBC 6.9 04/15/2015   HGB 13.4 04/15/2015   HCT 41.1 04/15/2015   MCV 89.8 04/15/2015   PLT 222 04/15/2015      Chemistry      Component Value Date/Time   NA 137 04/06/2015 1242   NA 134* 03/31/2015 1345   K 4.3 04/06/2015 1242   K 4.1 03/31/2015 1345   CL 99* 04/06/2015 1242   CO2 29 04/06/2015 1242   CO2 27 03/31/2015 1345   BUN 6 04/06/2015 1242   BUN 8.1 03/31/2015 1345   CREATININE 0.61 04/06/2015 1242   CREATININE 0.7 03/31/2015 1345      Component Value Date/Time   CALCIUM 9.3  04/06/2015 1242   CALCIUM 9.2 03/31/2015 1345   ALKPHOS 78 04/06/2015 1242   ALKPHOS 87 03/31/2015 1345   AST 33 04/06/2015 1242   AST 37* 03/31/2015 1345   ALT 17 04/06/2015 1242   ALT 19 03/31/2015 1345   BILITOT 0.6 04/06/2015 1242   BILITOT 0.40 03/31/2015 1345       RADIOGRAPHIC STUDIES: Dg Chest 2 View  04/12/2015  CLINICAL DATA:  Anterior lower rib pain, acute onset. Initial encounter. EXAM: CHEST  2 VIEW COMPARISON:  Chest radiograph from 03/18/2015 FINDINGS: The lungs are hyperexpanded, with flattening of the hemidiaphragms, compatible with COPD. The right suprahilar mass is again noted, measuring perhaps 4.8 cm, with biapical scarring. There is no evidence of pleural effusion or pneumothorax. The heart is normal in size; the mediastinal contour is within normal limits. No acute osseous abnormalities are seen. IMPRESSION: Right suprahilar mass again noted.  Underlying findings of COPD. Electronically Signed   By: Garald Balding M.D.   On: 04/12/2015 06:20   Mr Jeri Cos OZ Contrast  04/08/2015  CLINICAL DATA:  Squamous cell lung cancer, right.  Staging. EXAM: MRI HEAD WITHOUT AND WITH CONTRAST TECHNIQUE: Multiplanar, multiecho pulse sequences of the brain and surrounding structures were  obtained without and with intravenous contrast. CONTRAST:  56m MULTIHANCE GADOBENATE DIMEGLUMINE 529 MG/ML IV SOLN COMPARISON:  None. FINDINGS: There is no evidence of acute infarct, intracranial hemorrhage, mass, midline shift, or extra-axial fluid collection. Mild generalized cerebral atrophy is within normal limits for age. Small foci of T2 hyperintensity in the subcortical and deep cerebral white matter bilaterally are nonspecific but compatible with mild chronic small vessel ischemic disease. A chronic lacunar infarct is noted in the left caudate nucleus. No abnormal enhancement is identified. Orbits are unremarkable. Paranasal sinuses are clear. There are left middle ear and left mastoid effusions.  Major intracranial vascular flow voids are preserved. No suspicious skull lesions are identified. IMPRESSION: 1. No evidence of intracranial metastases. 2. Mild chronic small vessel ischemic disease. 3. Left middle ear and left mastoid effusions. Electronically Signed   By: ALogan BoresM.D.   On: 04/08/2015 16:17   Dg Chest Port 1 View  04/12/2015  CLINICAL DATA:  Postoperative right lung biopsy EXAM: PORTABLE CHEST 1 VIEW COMPARISON:  Study obtained earlier in the day FINDINGS: No pneumothorax. The mass in the right upper lobe is again noted with mild surrounding interstitial prominence, likely hemorrhage in the acute post biopsy setting. There is no new mass. The lungs are somewhat hyperexpanded. Left lung is clear. Heart size is normal. Pulmonary vascularity is normal. No adenopathy apparent radiographically. IMPRESSION: Suspect hemorrhage in the region of the right upper lobe biopsy. The mass in the right upper lobe is again noted without appreciable change. No pneumothorax. Left lung clear. Lungs are hyperexpanded. No change in cardiac silhouette. Electronically Signed   By: WLowella GripIII M.D.   On: 04/12/2015 11:40   Dg Chest Port 1 View  03/18/2015  CLINICAL DATA:  Post bronchoscopy with biopsy. EXAM: PORTABLE CHEST 1 VIEW COMPARISON:  PET-CT scan 02/25/2015 FINDINGS: Normal cardiac silhouette. RIGHT suprahilar mass again noted. No evidence of pneumothorax following biopsy. No pulmonary hemorrhage or pleural fluid. IMPRESSION: 1. No complication following biopsy. 2. RIGHT suprahilar mass again noted. Electronically Signed   By: SSuzy BouchardM.D.   On: 03/18/2015 09:42   Dg C-arm Bronchoscopy  03/18/2015  CLINICAL DATA:  C-ARM BRONCHOSCOPY Fluoroscopy was utilized by the requesting physician.  No radiographic interpretation.    ASSESSMENT AND PLAN: This is a very pleasant 70years old white female recently diagnosed with a stage IIb non-small cell lung cancer, squamous cell carcinoma  presented with large right upper lobe lung mass as well as satellite nodule. Evaluation of the mediastinal lymph nodes with repeat bronchoscopy, endobronchial ultrasound and biopsies as well as mediastinoscopy showed no evidence of metastatic disease to the mediastinal or hilar lymph nodes. I had a lengthy discussion with the patient and her family today about her current disease is stage, prognosis and treatment options. The patient is not a good surgical candidates because of her poor pulmonary function. I recommended for her a course of curative radiotherapy with or without concurrent chemotherapy. I discussed with the patient and her family the role of systemic chemotherapy in her condition as the patient may benefit from consolidation few cycles of systemic chemotherapy after completion of the course of radiation with or without chemotherapy. I will refer the patient to Dr. KSondra Comefor reevaluation and consideration of radiotherapy and I will consider the patient for concurrent systemic chemotherapy with carboplatin and paclitaxel if requested by Dr. KSondra Come I discussed with the patient adverse effect of the chemotherapy including but not limited to alopecia, myelosuppression, nausea and  vomiting, peripheral neuropathy, liver or renal dysfunction. The patient and her family agreed to the current plan. I will arrange for the patient follow-up visit in around 6 weeks or sooner if she requires concurrent chemotherapy. The patient was advised to call immediately if she has any concerning symptoms in the interval. The patient voices understanding of current disease status and treatment options and is in agreement with the current care plan.  All questions were answered. The patient knows to call the clinic with any problems, questions or concerns. We can certainly see the patient much sooner if necessary.  I spent 20 minutes counseling the patient face to face. The total time spent in the appointment  was 30 minutes.  Disclaimer: This note was dictated with voice recognition software. Similar sounding words can inadvertently be transcribed and may not be corrected upon review.

## 2015-04-15 NOTE — Telephone Encounter (Signed)
per pof to sch pt appt-gave pt copy of avs °

## 2015-04-18 ENCOUNTER — Telehealth: Payer: Self-pay | Admitting: *Deleted

## 2015-04-18 NOTE — Telephone Encounter (Signed)
Oncology Nurse Navigator Documentation  Oncology Nurse Navigator Flowsheets 04/18/2015  Navigator Encounter Type Telephone  Telephone Outgoing Call;Appt Confirmation/Clarification/patient's husband called to get clarification on appt.  I called to clarify.  He was thankful for the call.   Interventions Coordination of Care  Coordination of Care Other  Acuity Level 1  Time Spent with Patient 15
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
## 2015-04-20 ENCOUNTER — Encounter: Payer: Self-pay | Admitting: Internal Medicine

## 2015-04-20 ENCOUNTER — Institutional Professional Consult (permissible substitution): Payer: Self-pay | Admitting: Radiation Oncology

## 2015-04-20 NOTE — Progress Notes (Signed)
I placed fmla form for dr. Mckinley Jewel to sign.

## 2015-04-21 ENCOUNTER — Ambulatory Visit
Admission: RE | Admit: 2015-04-21 | Discharge: 2015-04-21 | Disposition: A | Discharge status: Home / Self Care | Payer: Managed Care, Other (non HMO) | Source: Ambulatory Visit | Attending: Radiation Oncology | Admitting: Radiation Oncology

## 2015-04-21 ENCOUNTER — Encounter: Payer: Self-pay | Admitting: Radiation Oncology

## 2015-04-21 ENCOUNTER — Encounter: Payer: Self-pay | Admitting: Internal Medicine

## 2015-04-21 VITALS — BP 126/74 | HR 73 | Temp 98.3°F | Resp 16 | Ht 64.0 in | Wt 135.2 lb

## 2015-04-21 DIAGNOSIS — Z510 Encounter for antineoplastic radiation therapy: Secondary | ICD-10-CM | POA: Insufficient documentation

## 2015-04-21 DIAGNOSIS — C3491 Malignant neoplasm of unspecified part of right bronchus or lung: Secondary | ICD-10-CM

## 2015-04-21 NOTE — Progress Notes (Signed)
Please see the Nurse Progress Note in the MD Initial Consult Encounter for this patient. 

## 2015-04-21 NOTE — Progress Notes (Signed)
I left mess folder ready for pk up with ms. Wilma at front desk--fmla

## 2015-04-21 NOTE — Progress Notes (Signed)
Radiation Oncology         [phone] ________________________________  Name: Amber Santiago MRN: [id number]  Date: 04/21/2015  DOB: 07-05-1945  Re-Evaluation Note  CC: Pcp Not In System  Curt Bears, MD    ICD-9-CM ICD-10-CM   1. Squamous cell lung cancer, right (HCC) 162.9 C34.91 Ambulatory referral to Social Work    Diagnosis: Stage IIB (T3, N0, M0) non-small cell lung cancer  Narrative:  The patient returns today for a re-evalation. I previously saw the patient at [facility] on 03/31/15. Brain MRI on 04/08/15 showed no evidence of intracranial metastasis. On 04/12/15, she had a mediastinoscopy with a biopsy of the right upper lobe posterior segment showing scant benign lung tissue and a biopsy of two lymph nodes were benign. One bronchial washing and four fine needle aspirations revealed no malignancy. Therefore, evaluation of the mediastinal lymph nodes with repeat bronchoscopy, endobronchial ultrasound and biopsies as well as mediastinoscopy showed no evidence of metastatic disease to the mediastinal or hilar lymph nodes. She saw Dr. Julien Nordmann on 04/14/14 who discussed the role of systemic chemotherapy in her condition as she may benefit from it. She was referred back to me for reevaluation and consideration of radiotherapy.  She presents to the clinic with her husband and son. She denies difficulty breathing, but reports some hemoptysis the day after surgery. She has no pain at this time, expect for hip pain.  ALLERGIES:  is allergic to amoxicillin and penicillins.  Meds: Current Outpatient Prescriptions  Medication Sig Dispense Refill  . budesonide-formoterol (SYMBICORT) 160-4.5 MCG/ACT inhaler INHALE TWO PUFFS INTO LUNGS TWICE DAILY 11 g 11  . HYDROcodone-acetaminophen (VICODIN) 2.5-500 MG tablet Take 1 tablet by mouth every 4 (four) hours as needed for pain. 15 tablet 0  . Spacer/Aero-Holding Chambers (AEROCHAMBER MV) inhaler Use as instructed 1 each 0  . Acetaminophen (TYLENOL) 325  MG CAPS Take 500 mg by mouth every 6 (six) hours as needed. Reported on 04/21/2015    . Ascorbic Acid (VITAMIN C) 1000 MG tablet Take 1,000 mg by mouth daily. Reported on 04/21/2015    . bevacizumab (AVASTIN) 1.25 mg/0.1 mL SOLN 1.25 mg by Intravitreal route to Surgery. Reported on 04/21/2015    . cholecalciferol (VITAMIN D) 1000 UNITS tablet Take 2,000 Units by mouth daily. Reported on 04/21/2015    . Cyanocobalamin (B-12 PO) Take 1 tablet by mouth daily. Reported on 04/21/2015    . GARLIC PO Take 1 capsule by mouth daily. Reported on 04/21/2015    . LORazepam (ATIVAN) 0.5 MG tablet Take 1 tablet (0.5 mg total) by mouth 2 (two) times daily. (Patient not taking: Reported on 04/21/2015) 10 tablet 0  . Multiple Vitamins-Minerals (PRESERVISION AREDS 2 PO) Take 1 capsule by mouth daily. Reported on 04/21/2015    . Pyridoxine HCl (B-6 PO) Take 1 tablet by mouth daily. Reported on 04/21/2015    . TURMERIC CURCUMIN PO Take 1 capsule by mouth daily. Reported on 04/21/2015     No current facility-administered medications for this encounter.    Physical Findings: The patient is in no acute distress. Patient is alert and oriented.  height is '5\' 4"'$  (1.626 m) and weight is 135 lb 3.2 oz (61.326 kg). Her oral temperature is 98.3 F (36.8 C). Her blood pressure is 126/74 and her pulse is 73. Her respiration is 16 and oxygen saturation is 99%.  Lungs are clear to auscultation bilaterally. Heart has regular rate and rhythm. No palpable cervical, supraclavicular, or axillary adenopathy. Mediastinoscopy scar healing well  with no sign of infection or drainage.  Lab Findings: Lab Results  Component Value Date   WBC 6.9 04/15/2015   HGB 13.4 04/15/2015   HCT 41.1 04/15/2015   MCV 89.8 04/15/2015   PLT 222 04/15/2015    Radiographic Findings: Dg Chest 2 View  04/12/2015  CLINICAL DATA:  Anterior lower rib pain, acute onset. Initial encounter. EXAM: CHEST  2 VIEW COMPARISON:  Chest radiograph from 03/18/2015 FINDINGS:  The lungs are hyperexpanded, with flattening of the hemidiaphragms, compatible with COPD. The right suprahilar mass is again noted, measuring perhaps 4.8 cm, with biapical scarring. There is no evidence of pleural effusion or pneumothorax. The heart is normal in size; the mediastinal contour is within normal limits. No acute osseous abnormalities are seen. IMPRESSION: Right suprahilar mass again noted.  Underlying findings of COPD. Electronically Signed   By: Garald Balding M.D.   On: 04/12/2015 06:20   Mr Jeri Cos TK Contrast  04/08/2015  CLINICAL DATA:  Squamous cell lung cancer, right.  Staging. EXAM: MRI HEAD WITHOUT AND WITH CONTRAST TECHNIQUE: Multiplanar, multiecho pulse sequences of the brain and surrounding structures were obtained without and with intravenous contrast. CONTRAST:  11m MULTIHANCE GADOBENATE DIMEGLUMINE 529 MG/ML IV SOLN COMPARISON:  None. FINDINGS: There is no evidence of acute infarct, intracranial hemorrhage, mass, midline shift, or extra-axial fluid collection. Mild generalized cerebral atrophy is within normal limits for age. Small foci of T2 hyperintensity in the subcortical and deep cerebral white matter bilaterally are nonspecific but compatible with mild chronic small vessel ischemic disease. A chronic lacunar infarct is noted in the left caudate nucleus. No abnormal enhancement is identified. Orbits are unremarkable. Paranasal sinuses are clear. There are left middle ear and left mastoid effusions. Major intracranial vascular flow voids are preserved. No suspicious skull lesions are identified. IMPRESSION: 1. No evidence of intracranial metastases. 2. Mild chronic small vessel ischemic disease. 3. Left middle ear and left mastoid effusions. Electronically Signed   By: ALogan BoresM.D.   On: 04/08/2015 16:17   Dg Chest Port 1 View  04/12/2015  CLINICAL DATA:  Postoperative right lung biopsy EXAM: PORTABLE CHEST 1 VIEW COMPARISON:  Study obtained earlier in the day FINDINGS: No  pneumothorax. The mass in the right upper lobe is again noted with mild surrounding interstitial prominence, likely hemorrhage in the acute post biopsy setting. There is no new mass. The lungs are somewhat hyperexpanded. Left lung is clear. Heart size is normal. Pulmonary vascularity is normal. No adenopathy apparent radiographically. IMPRESSION: Suspect hemorrhage in the region of the right upper lobe biopsy. The mass in the right upper lobe is again noted without appreciable change. No pneumothorax. Left lung clear. Lungs are hyperexpanded. No change in cardiac silhouette. Electronically Signed   By: WLowella GripIII M.D.   On: 04/12/2015 11:40    Impression: The patient would be a good candidate for a definitive course of radiotherapy. We discussed treatment side effects and long term toxicities of treatment. We discussed CT simulation and the placement of tattoos. We discussed 6 - 6 1/2 weeks of treatment. She would like to proceed with treatment.  Plan: CT simulation is scheduled on January 17 with treatment to begin afterwards. We will discuss with Dr. MJulien Nordmannas to whether the patient may benefit from radiosensitizing chemotherapy. She will also likely receive additional chemotherapy after she completes her chest radiation treatment unless she has had a complete response to this initial course of treatment.  ____________________________________  JBlair Promise PhD,  MD  This document serves as a record of services personally performed by Gery Pray, MD. It was created on his behalf by Darcus Austin, a trained medical scribe. The creation of this record is based on the scribe's personal observations and the provider's statements to them. This document has been checked and approved by the attending provider.

## 2015-04-22 ENCOUNTER — Encounter (INDEPENDENT_AMBULATORY_CARE_PROVIDER_SITE_OTHER): Payer: Managed Care, Other (non HMO) | Admitting: Ophthalmology

## 2015-04-22 DIAGNOSIS — H353231 Exudative age-related macular degeneration, bilateral, with active choroidal neovascularization: Secondary | ICD-10-CM | POA: Diagnosis not present

## 2015-04-22 DIAGNOSIS — H43813 Vitreous degeneration, bilateral: Secondary | ICD-10-CM | POA: Diagnosis not present

## 2015-04-22 NOTE — Addendum Note (Signed)
Encounter addended by: Jacqulyn Liner, RN on: 04/22/2015  8:11 AM<BR>     Documentation filed: Charges VN

## 2015-04-27 ENCOUNTER — Encounter: Payer: Self-pay | Admitting: Radiation Oncology

## 2015-04-27 ENCOUNTER — Ambulatory Visit
Admission: RE | Admit: 2015-04-27 | Discharge: 2015-04-27 | Disposition: A | Discharge status: Home / Self Care | Payer: Managed Care, Other (non HMO) | Source: Ambulatory Visit | Attending: Radiation Oncology | Admitting: Radiation Oncology

## 2015-04-27 DIAGNOSIS — C3491 Malignant neoplasm of unspecified part of right bronchus or lung: Secondary | ICD-10-CM

## 2015-04-27 DIAGNOSIS — Z510 Encounter for antineoplastic radiation therapy: Secondary | ICD-10-CM | POA: Diagnosis not present

## 2015-04-27 NOTE — Progress Notes (Signed)
Paperwork (FMLA) received, given to RN 04/27/15 Ardeen Fillers)

## 2015-05-02 DIAGNOSIS — Z510 Encounter for antineoplastic radiation therapy: Secondary | ICD-10-CM | POA: Diagnosis not present

## 2015-05-02 NOTE — Progress Notes (Signed)
  Radiation Oncology         (336) [phone] ________________________________  Name: Amber Santiago MRN: [id number]  Date: 04/27/2015  DOB: Jul 08, 1945  SIMULATION AND TREATMENT PLANNING NOTE    ICD-9-CM ICD-10-CM   1. 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  Identity was confirmed.  All relevant records and images related to the planned course of therapy were reviewed.  The patient freely provided informed written consent to proceed with treatment after reviewing the details related to the planned course of therapy. The consent form was witnessed and verified by the simulation staff.  Then, the patient was set-up in a stable reproducible  supine position for radiation therapy.  CT images were obtained.  Surface markings were placed.  The CT images were loaded into the planning software.  Then the target and avoidance structures were contoured.  Treatment planning then occurred.  The radiation prescription was entered and confirmed.  Then, I designed and supervised the construction of a total of 5 medically necessary complex treatment devices.  I have requested : 3D Simulation  I have requested a DVH of the following structures: heart, lungs, spinal cord, esophagus, GTV, PTV.  I have ordered:dose calc.  PLAN:  The patient will receive 64 Gy in 32 fractions.  ________________________________  -----------------------------------  Blair Promise, PhD, MD

## 2015-05-04 ENCOUNTER — Encounter: Payer: Self-pay | Admitting: Radiation Oncology

## 2015-05-04 ENCOUNTER — Ambulatory Visit
Admission: RE | Admit: 2015-05-04 | Discharge: 2015-05-04 | Disposition: A | Discharge status: Home / Self Care | Payer: Managed Care, Other (non HMO) | Source: Ambulatory Visit | Attending: Radiation Oncology | Admitting: Radiation Oncology

## 2015-05-04 DIAGNOSIS — Z510 Encounter for antineoplastic radiation therapy: Secondary | ICD-10-CM | POA: Diagnosis not present

## 2015-05-04 DIAGNOSIS — C3491 Malignant neoplasm of unspecified part of right bronchus or lung: Secondary | ICD-10-CM

## 2015-05-04 NOTE — Progress Notes (Signed)
  Radiation Oncology         [phone] ________________________________kk  Name: Amber Santiago MRN: [id number]  Date: 05/04/2015  DOB: [birth year]  Simulation Verification Note    ICD-9-CM ICD-10-CM   1. 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 The clinical setup was verified. Then port films were obtained and uploaded to the radiation oncology medical record software.  The treatment beams were carefully compared against the planned radiation fields. The position location and shape of the radiation fields was reviewed. They targeted volume of tissue appears to be appropriately covered by the radiation beams. Organs at risk appear to be excluded as planned.  Based on my personal review, I approved the simulation verification. The patient's treatment will proceed as planned.  -----------------------------------  Blair Promise, PhD, MD

## 2015-05-04 NOTE — Progress Notes (Signed)
Paperwork received from doctor, faxed to [facility] [phone], confirmation received, 05/04/15 Ardeen Fillers)

## 2015-05-05 ENCOUNTER — Ambulatory Visit
Admission: RE | Admit: 2015-05-05 | Discharge: 2015-05-05 | Disposition: A | Discharge status: Home / Self Care | Payer: Managed Care, Other (non HMO) | Source: Ambulatory Visit | Attending: Radiation Oncology | Admitting: Radiation Oncology

## 2015-05-05 DIAGNOSIS — C3491 Malignant neoplasm of unspecified part of right bronchus or lung: Secondary | ICD-10-CM | POA: Diagnosis not present

## 2015-05-05 DIAGNOSIS — Z510 Encounter for antineoplastic radiation therapy: Secondary | ICD-10-CM | POA: Diagnosis not present

## 2015-05-05 MED ORDER — RADIAPLEXRX EX GEL
Freq: Once | CUTANEOUS | Status: AC
Start: 2015-05-05 — End: 2015-05-05
  Administered 2015-05-05: 15:00:00 via TOPICAL

## 2015-05-05 NOTE — Progress Notes (Signed)
Pt here for patient teaching.  Pt given Radiation and You booklet and Radiaplex gel. Pt reports they have not watched the Radiation Therapy Education video and has been given a link to watch the video at home.  Reviewed areas of pertinence such as fatigue, skin changes, throat changes and cough . Pt able to give teach back of to pat skin and use unscented/gentle soap,apply Radiaplex bid and avoid applying anything to skin within 4 hours of treatment. Pt demonstrated understanding and verbalizes understanding of information given and will contact nursing with any questions or concerns.     [email or web address]

## 2015-05-06 ENCOUNTER — Ambulatory Visit
Admission: RE | Admit: 2015-05-06 | Discharge: 2015-05-06 | Disposition: A | Discharge status: Home / Self Care | Payer: Managed Care, Other (non HMO) | Source: Ambulatory Visit | Attending: Radiation Oncology | Admitting: Radiation Oncology

## 2015-05-06 DIAGNOSIS — Z510 Encounter for antineoplastic radiation therapy: Secondary | ICD-10-CM | POA: Diagnosis not present

## 2015-05-09 ENCOUNTER — Ambulatory Visit
Admission: RE | Admit: 2015-05-09 | Discharge: 2015-05-09 | Disposition: A | Discharge status: Home / Self Care | Payer: Managed Care, Other (non HMO) | Source: Ambulatory Visit | Attending: Radiation Oncology | Admitting: Radiation Oncology

## 2015-05-09 DIAGNOSIS — Z510 Encounter for antineoplastic radiation therapy: Secondary | ICD-10-CM | POA: Diagnosis not present

## 2015-05-10 ENCOUNTER — Ambulatory Visit
Admission: RE | Admit: 2015-05-10 | Discharge: 2015-05-10 | Disposition: A | Discharge status: Home / Self Care | Payer: Managed Care, Other (non HMO) | Source: Ambulatory Visit | Attending: Radiation Oncology | Admitting: Radiation Oncology

## 2015-05-10 ENCOUNTER — Encounter: Payer: Self-pay | Admitting: Radiation Oncology

## 2015-05-10 VITALS — BP 138/85 | HR 53 | Temp 97.5°F | Wt 136.8 lb

## 2015-05-10 DIAGNOSIS — C3491 Malignant neoplasm of unspecified part of right bronchus or lung: Secondary | ICD-10-CM

## 2015-05-10 DIAGNOSIS — Z510 Encounter for antineoplastic radiation therapy: Secondary | ICD-10-CM | POA: Diagnosis not present

## 2015-05-10 MED ORDER — HYDROCODONE-ACETAMINOPHEN 5-325 MG PO TABS: 1.0000 | ORAL_TABLET | ORAL | Status: DC | PRN

## 2015-05-10 NOTE — Progress Notes (Signed)
Amber Santiago is here for her 4th fraction of radiation to her Right Chest. She reports a feeling of fullness in her right chest which she rates a 6/10. She has not been taking her pain medicine regularly, and will use a heating pad at night to help. She has a decreased appetite, but reports she has always been this way. She has some anxiety related to her treatment and transportation here. The skin over her radiation site is slightly red, and I have encouraged her to use the radiaplex cream as directed.   BP 138/85 mmHg  Pulse 53  Temp(Src) 97.5 F (36.4 C)  Wt 136 lb 12.8 oz (62.052 kg)  SpO2 98%   Wt Readings from Last 3 Encounters:  05/10/15 136 lb 12.8 oz (62.052 kg)  04/21/15 135 lb 3.2 oz (61.326 kg)  04/15/15 137 lb 1.6 oz (62.188 kg)

## 2015-05-10 NOTE — Progress Notes (Signed)
  Radiation Oncology         [phone] ________________________________  Name: Amber Santiago MRN: [id number]  Date: 05/10/2015  DOB: 07-04-1945  Weekly Radiation Therapy Management    ICD-9-CM ICD-10-CM   1. 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 . . . . . . . The patient presents for routine under treatment assessment.                                   The patient is without complaint except for some bilateral lower rib cage pain. This is been a chronic issue for her. She denies any pain with swallowing or difficulty with swallowing. No breathing issues                                 Set-up films were reviewed.                                 The chart was checked. Physical Findings. . .  weight is 136 lb 12.8 oz (62.052 kg). Her temperature is 97.5 F (36.4 C). Her blood pressure is 138/85 and her pulse is 53. Her oxygen saturation is 98%. . The lungs are clear. The heart has a regular rhythm and rate. No palpable supraclavicular or axillary adenopathy Impression . . . . . . . The patient is tolerating radiation. Plan . . . . . . . . . . . . Continue treatment as planned.  ________________________________   Blair Promise, PhD, MD

## 2015-05-11 ENCOUNTER — Telehealth: Payer: Self-pay | Admitting: *Deleted

## 2015-05-11 ENCOUNTER — Ambulatory Visit
Admission: RE | Admit: 2015-05-11 | Discharge: 2015-05-11 | Disposition: A | Discharge status: Home / Self Care | Payer: Managed Care, Other (non HMO) | Source: Ambulatory Visit | Attending: Radiation Oncology | Admitting: Radiation Oncology

## 2015-05-11 DIAGNOSIS — Z510 Encounter for antineoplastic radiation therapy: Secondary | ICD-10-CM | POA: Diagnosis not present

## 2015-05-11 NOTE — Telephone Encounter (Signed)
Oncology Nurse Navigator Documentation  Oncology Nurse Navigator Flowsheets 05/11/2015  Navigator Location [facility]  Navigator Encounter Type Telephone/I received a call from Amber Santiago.  He states that his wife would like to do [place] class and was asking if her daughter can as well.  I spoke with Amber Santiago CSW and she stated yes, daughter can participate. I called the husband back and left a vm message with that information and to call me if needed.   Treatment Phase Treatment  Barriers/Navigation Needs Education  Education Other  Interventions Education Method  Education Method Verbal  Acuity Level 2  Time Spent with Patient 30

## 2015-05-12 ENCOUNTER — Ambulatory Visit
Admission: RE | Admit: 2015-05-12 | Discharge: 2015-05-12 | Disposition: A | Discharge status: Home / Self Care | Payer: Managed Care, Other (non HMO) | Source: Ambulatory Visit | Attending: Radiation Oncology | Admitting: Radiation Oncology

## 2015-05-12 ENCOUNTER — Encounter: Payer: Self-pay | Admitting: *Deleted

## 2015-05-12 DIAGNOSIS — Z510 Encounter for antineoplastic radiation therapy: Secondary | ICD-10-CM | POA: Diagnosis not present

## 2015-05-12 NOTE — Progress Notes (Signed)
[place] Psychosocial Distress Screening Clinical Social Work  Clinical Social Work was referred by distress screening protocol.  The patient scored a 6 on the Psychosocial Distress Thermometer which indicates moderate distress. Clinical Social Worker attempted to contact patient by phone to assess for distress and other psychosocial needs. CSW left voicemail requesting patient return call.  ONCBCN DISTRESS SCREENING 04/21/2015  Screening Type Initial Screening  Distress experienced in past week (1-10) 6  Emotional problem type Nervousness/Anxiety;Boredom  Information Concerns Type   Physical Problem type Pain;Breathing;Constipation/diarrhea  Referral to clinical social work   Referral to support programs     Polo Riley, MSW, LCSW, OSW-C Clinical Social Worker [facility] [phone]

## 2015-05-13 ENCOUNTER — Ambulatory Visit
Admission: RE | Admit: 2015-05-13 | Discharge: 2015-05-13 | Disposition: A | Discharge status: Home / Self Care | Payer: Managed Care, Other (non HMO) | Source: Ambulatory Visit | Attending: Radiation Oncology | Admitting: Radiation Oncology

## 2015-05-13 DIAGNOSIS — Z510 Encounter for antineoplastic radiation therapy: Secondary | ICD-10-CM | POA: Diagnosis not present

## 2015-05-16 ENCOUNTER — Ambulatory Visit
Admission: RE | Admit: 2015-05-16 | Discharge: 2015-05-16 | Disposition: A | Discharge status: Home / Self Care | Payer: Managed Care, Other (non HMO) | Source: Ambulatory Visit | Attending: Radiation Oncology | Admitting: Radiation Oncology

## 2015-05-16 ENCOUNTER — Telehealth: Payer: Self-pay | Admitting: Oncology

## 2015-05-16 DIAGNOSIS — Z510 Encounter for antineoplastic radiation therapy: Secondary | ICD-10-CM | POA: Diagnosis not present

## 2015-05-16 NOTE — Telephone Encounter (Signed)
Called to see how Amber Santiago is feeling.  Spoke to her husband [name] who said she had a sore throat over the weekend.  He said it is better now.  She is also reporting having right side pain which they think is due to the treatment table.  They will discuss these issues with Dr. Sondra Come tomorrow.

## 2015-05-17 ENCOUNTER — Ambulatory Visit
Admission: RE | Admit: 2015-05-17 | Discharge: 2015-05-17 | Disposition: A | Discharge status: Home / Self Care | Payer: Managed Care, Other (non HMO) | Source: Ambulatory Visit | Attending: Radiation Oncology | Admitting: Radiation Oncology

## 2015-05-17 ENCOUNTER — Encounter: Payer: Self-pay | Admitting: Radiation Oncology

## 2015-05-17 VITALS — BP 157/72 | HR 72 | Temp 97.9°F | Wt 136.8 lb

## 2015-05-17 DIAGNOSIS — C3491 Malignant neoplasm of unspecified part of right bronchus or lung: Secondary | ICD-10-CM

## 2015-05-17 DIAGNOSIS — Z510 Encounter for antineoplastic radiation therapy: Secondary | ICD-10-CM | POA: Diagnosis not present

## 2015-05-17 LAB — URINALYSIS, MICROSCOPIC - CHCC
BILIRUBIN (URINE): NEGATIVE
BLOOD: NEGATIVE
GLUCOSE UR CHCC: NEGATIVE mg/dL
KETONES: NEGATIVE mg/dL
Leukocyte Esterase: NEGATIVE
Nitrite: NEGATIVE
PH: 6.5 (ref 4.6–8.0)
Protein: NEGATIVE mg/dL
RBC / HPF: NEGATIVE (ref 0–2)
SPECIFIC GRAVITY, URINE: 1.005 (ref 1.003–1.035)
Urobilinogen, UR: 0.2 mg/dL (ref 0.2–1)

## 2015-05-17 NOTE — Progress Notes (Signed)
  Radiation Oncology         (336) [phone] ________________________________  Name: Amber Santiago MRN: [id number]  Date: 05/17/2015  DOB: 15-Feb-1946  Weekly Radiation Therapy Management    ICD-9-CM ICD-10-CM   1. 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 . . . . . . . The patient presents for routine under treatment assessment.                                   The patient is having some right flank pain. She is also noticed increased urinary frequency but no dysuria or hematuria. The patient will present lab today for urinalysis and culture to rule out infection. She also complains of some pain in the right upper back region                                 Set-up films were reviewed.                                 The chart was checked. Physical Findings. . .  weight is 136 lb 12.8 oz (62.052 kg). Her temperature is 97.9 F (36.6 C). Her blood pressure is 157/72 and her pulse is 72. Her oxygen saturation is 98%. . The lungs are clear. The heart has regular rhythm and rate. Palpation along the back area reveals some tenderness in the right upper back medial to the scapula which appears to be muscular in origin Impression . . . . . . . The patient is tolerating radiation. Plan . . . . . . . . . . . . Continue treatment as planned.  ________________________________   Blair Promise, PhD, MD

## 2015-05-17 NOTE — Progress Notes (Signed)
Amber Santiago is here for her 9th fraction of radiation to her Right Chest. She complains of pain that starts on her Right side and will spread up her back intermittently. It was worse this weekend, but has improved today. She describes this pain a 8/10 and will take hydrocodone for this pain at times. She admits to fatigue, and will go to bed early at night at times. She reports eating well, but reports throat pain at this time. She also reports a dry throat. Her skin to her chest and back are red, and she is using the radiaplex cream to her chest, and will begin using it to her back.   BP 157/72 mmHg  Pulse 72  Temp(Src) 97.9 F (36.6 C)  Wt 136 lb 12.8 oz (62.052 kg)  SpO2 98%   Wt Readings from Last 3 Encounters:  05/17/15 136 lb 12.8 oz (62.052 kg)  05/10/15 136 lb 12.8 oz (62.052 kg)  04/21/15 135 lb 3.2 oz (61.326 kg)

## 2015-05-18 ENCOUNTER — Ambulatory Visit
Admission: RE | Admit: 2015-05-18 | Discharge: 2015-05-18 | Disposition: A | Discharge status: Home / Self Care | Payer: Managed Care, Other (non HMO) | Source: Ambulatory Visit | Attending: Radiation Oncology | Admitting: Radiation Oncology

## 2015-05-18 DIAGNOSIS — Z510 Encounter for antineoplastic radiation therapy: Secondary | ICD-10-CM | POA: Diagnosis not present

## 2015-05-18 LAB — URINE CULTURE: Organism ID, Bacteria: NO GROWTH

## 2015-05-19 ENCOUNTER — Telehealth: Payer: Self-pay | Admitting: Oncology

## 2015-05-19 ENCOUNTER — Ambulatory Visit
Admission: RE | Admit: 2015-05-19 | Discharge: 2015-05-19 | Disposition: A | Discharge status: Home / Self Care | Payer: Managed Care, Other (non HMO) | Source: Ambulatory Visit | Attending: Radiation Oncology | Admitting: Radiation Oncology

## 2015-05-19 DIAGNOSIS — Z510 Encounter for antineoplastic radiation therapy: Secondary | ICD-10-CM | POA: Diagnosis not present

## 2015-05-19 NOTE — Telephone Encounter (Signed)
Left a message for [facility] regarding her good urine culture results.  Requested a return call.

## 2015-05-20 ENCOUNTER — Encounter (INDEPENDENT_AMBULATORY_CARE_PROVIDER_SITE_OTHER): Payer: Managed Care, Other (non HMO) | Admitting: Ophthalmology

## 2015-05-20 ENCOUNTER — Ambulatory Visit
Admission: RE | Admit: 2015-05-20 | Discharge: 2015-05-20 | Disposition: A | Discharge status: Home / Self Care | Payer: Managed Care, Other (non HMO) | Source: Ambulatory Visit | Attending: Radiation Oncology | Admitting: Radiation Oncology

## 2015-05-20 DIAGNOSIS — H353231 Exudative age-related macular degeneration, bilateral, with active choroidal neovascularization: Secondary | ICD-10-CM

## 2015-05-20 DIAGNOSIS — H40053 Ocular hypertension, bilateral: Secondary | ICD-10-CM

## 2015-05-20 DIAGNOSIS — Z510 Encounter for antineoplastic radiation therapy: Secondary | ICD-10-CM | POA: Diagnosis not present

## 2015-05-20 DIAGNOSIS — H43813 Vitreous degeneration, bilateral: Secondary | ICD-10-CM

## 2015-05-20 DIAGNOSIS — H2513 Age-related nuclear cataract, bilateral: Secondary | ICD-10-CM

## 2015-05-23 ENCOUNTER — Ambulatory Visit
Admission: RE | Admit: 2015-05-23 | Discharge: 2015-05-23 | Disposition: A | Discharge status: Home / Self Care | Payer: Managed Care, Other (non HMO) | Source: Ambulatory Visit | Attending: Radiation Oncology | Admitting: Radiation Oncology

## 2015-05-23 DIAGNOSIS — Z510 Encounter for antineoplastic radiation therapy: Secondary | ICD-10-CM | POA: Diagnosis not present

## 2015-05-24 ENCOUNTER — Ambulatory Visit
Admission: RE | Admit: 2015-05-24 | Discharge: 2015-05-24 | Disposition: A | Discharge status: Home / Self Care | Payer: Managed Care, Other (non HMO) | Source: Ambulatory Visit | Attending: Radiation Oncology | Admitting: Radiation Oncology

## 2015-05-24 ENCOUNTER — Encounter: Payer: Self-pay | Admitting: Radiation Oncology

## 2015-05-24 VITALS — BP 150/69 | HR 73 | Temp 97.7°F | Resp 16 | Ht 64.0 in | Wt 138.1 lb

## 2015-05-24 DIAGNOSIS — C3491 Malignant neoplasm of unspecified part of right bronchus or lung: Secondary | ICD-10-CM

## 2015-05-24 DIAGNOSIS — Z510 Encounter for antineoplastic radiation therapy: Secondary | ICD-10-CM | POA: Diagnosis not present

## 2015-05-24 DIAGNOSIS — Z923 Personal history of irradiation: Secondary | ICD-10-CM | POA: Diagnosis not present

## 2015-05-24 MED ORDER — RADIAPLEXRX EX GEL
Freq: Once | CUTANEOUS | Status: AC
  Administered 2015-05-24: 14:00:00 via TOPICAL

## 2015-05-24 NOTE — Progress Notes (Signed)

## 2015-05-24 NOTE — Progress Notes (Signed)
  Radiation Oncology         (336) [phone] ________________________________  Name: Amber Santiago MRN: [id number]  Date: 05/24/2015  DOB: 04-17-1945  Weekly Radiation Therapy Management    ICD-9-CM ICD-10-CM   1. 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 . . . . . . . The patient presents for routine under treatment assessment.                                  She reports the pain in her right side was worse over the weekend. She is taking hydrocodone/acetaminophen as needed (she took 2 tablets over the weekend). She denies having a sore throat or trouble swallowing. She reports she is coughing less. She denies having hemoptysis. The skin on her right upper back is red. She is using radiapelx and need a refill. Another tube has been given. She reports having constipation with her last bm 2-3 days ago. She will be given the constipation handout. She reports having fatigue.                                 Set-up films were reviewed.                                 The chart was checked. Physical Findings. . .  height is '5\' 4"'$  (1.626 m) and weight is 138 lb 1.6 oz (62.642 kg). Her oral temperature is 97.7 F (36.5 C). Her blood pressure is 150/69 and her pulse is 73. Her respiration is 16 and oxygen saturation is 99%. . No point tenderness with palpation along the spine and upper back or anterior chest region. The lungs are clear. The heart has a regular rhythm and rate. Mild erythema noted in the right upper back region, consistent with radiation affect Impression . . . . . . . The patient is tolerating radiation. Plan . . . . . . . . . . . . Continue treatment as planned.  ________________________________   Blair Promise, PhD, MD

## 2015-05-24 NOTE — Progress Notes (Signed)
Amber Santiago has completed 14 fractions to her right chest.  She reports the pain in her right side was worse over the weekend.  She is taking hydrocodone/acetaminophen as needed (she took 2 tablets over the weekend).  She denies having a sore throat or trouble swallowing.  She reports she is coughing less.  She denies having hemoptysis.  The skin on her right upper back is red.  She is using radiapelx and need a refill.  Another tube has been given.  She reports having constipation with her last bm 2-3 days ago.  She will be given the constipation handout.  She reports having fatigue.  BP 150/69 mmHg  Pulse 73  Temp(Src) 97.7 F (36.5 C) (Oral)  Resp 16  Ht '5\' 4"'$  (1.626 m)  Wt 138 lb 1.6 oz (62.642 kg)  BMI 23.69 kg/m2  SpO2 99%   Wt Readings from Last 3 Encounters:  05/24/15 138 lb 1.6 oz (62.642 kg)  05/17/15 136 lb 12.8 oz (62.052 kg)  05/10/15 136 lb 12.8 oz (62.052 kg)

## 2015-05-25 ENCOUNTER — Ambulatory Visit
Admission: RE | Admit: 2015-05-25 | Discharge: 2015-05-25 | Disposition: A | Discharge status: Home / Self Care | Payer: Managed Care, Other (non HMO) | Source: Ambulatory Visit | Attending: Radiation Oncology | Admitting: Radiation Oncology

## 2015-05-25 DIAGNOSIS — Z510 Encounter for antineoplastic radiation therapy: Secondary | ICD-10-CM | POA: Diagnosis not present

## 2015-05-26 ENCOUNTER — Telehealth: Payer: Self-pay | Admitting: *Deleted

## 2015-05-26 ENCOUNTER — Ambulatory Visit
Admission: RE | Admit: 2015-05-26 | Discharge: 2015-05-26 | Disposition: A | Discharge status: Home / Self Care | Payer: Managed Care, Other (non HMO) | Source: Ambulatory Visit | Attending: Radiation Oncology | Admitting: Radiation Oncology

## 2015-05-26 DIAGNOSIS — Z510 Encounter for antineoplastic radiation therapy: Secondary | ICD-10-CM | POA: Diagnosis not present

## 2015-05-26 NOTE — Telephone Encounter (Signed)
Oncology Nurse Navigator Documentation  Oncology Nurse Navigator Flowsheets 05/26/2015  Navigator Location [facility]  Navigator Encounter Type Telephone/Mr. Heese called and needed to change appt for his wife.  I checked with Dr. Julien Nordmann and he stated she could be seen on Tuesday 21.  I called Mr. Brymer to update.  He stated he will just keep appt on Monday.  Dr. Julien Nordmann updated   Treatment Phase Treatment  Barriers/Navigation Needs Coordination of Care  Interventions Coordination of Care  Coordination of Care Appts  Acuity Level 1  Time Spent with Patient 15

## 2015-05-27 ENCOUNTER — Ambulatory Visit
Admission: RE | Admit: 2015-05-27 | Discharge: 2015-05-27 | Disposition: A | Discharge status: Home / Self Care | Payer: Managed Care, Other (non HMO) | Source: Ambulatory Visit | Attending: Radiation Oncology | Admitting: Radiation Oncology

## 2015-05-27 DIAGNOSIS — Z510 Encounter for antineoplastic radiation therapy: Secondary | ICD-10-CM | POA: Diagnosis not present

## 2015-05-30 ENCOUNTER — Telehealth: Payer: Self-pay | Admitting: Internal Medicine

## 2015-05-30 ENCOUNTER — Encounter: Payer: Self-pay | Admitting: Internal Medicine

## 2015-05-30 ENCOUNTER — Ambulatory Visit
Admission: RE | Admit: 2015-05-30 | Discharge: 2015-05-30 | Disposition: A | Discharge status: Home / Self Care | Payer: Managed Care, Other (non HMO) | Source: Ambulatory Visit | Attending: Radiation Oncology | Admitting: Radiation Oncology

## 2015-05-30 ENCOUNTER — Ambulatory Visit (HOSPITAL_BASED_OUTPATIENT_CLINIC_OR_DEPARTMENT_OTHER): Payer: Managed Care, Other (non HMO) | Admitting: Internal Medicine

## 2015-05-30 ENCOUNTER — Encounter: Payer: Self-pay | Admitting: *Deleted

## 2015-05-30 ENCOUNTER — Other Ambulatory Visit (HOSPITAL_BASED_OUTPATIENT_CLINIC_OR_DEPARTMENT_OTHER): Payer: Managed Care, Other (non HMO)

## 2015-05-30 VITALS — BP 136/68 | HR 67 | Temp 98.0°F | Resp 18 | Ht 64.0 in | Wt 136.5 lb

## 2015-05-30 DIAGNOSIS — C3411 Malignant neoplasm of upper lobe, right bronchus or lung: Secondary | ICD-10-CM

## 2015-05-30 DIAGNOSIS — C3491 Malignant neoplasm of unspecified part of right bronchus or lung: Secondary | ICD-10-CM

## 2015-05-30 DIAGNOSIS — Z510 Encounter for antineoplastic radiation therapy: Secondary | ICD-10-CM | POA: Diagnosis not present

## 2015-05-30 LAB — CBC WITH DIFFERENTIAL/PLATELET
BASO%: 1 % (ref 0.0–2.0)
BASOS ABS: 0.1 10*3/uL (ref 0.0–0.1)
EOS%: 0.9 % (ref 0.0–7.0)
Eosinophils Absolute: 0 10*3/uL (ref 0.0–0.5)
HCT: 39.8 % (ref 34.8–46.6)
HGB: 13.4 g/dL (ref 11.6–15.9)
LYMPH#: 0.5 10*3/uL — AB (ref 0.9–3.3)
LYMPH%: 8.6 % — AB (ref 14.0–49.7)
MCH: 30 pg (ref 25.1–34.0)
MCHC: 33.7 g/dL (ref 31.5–36.0)
MCV: 89.1 fL (ref 79.5–101.0)
MONO#: 0.4 10*3/uL (ref 0.1–0.9)
MONO%: 7.4 % (ref 0.0–14.0)
NEUT#: 4.6 10*3/uL (ref 1.5–6.5)
NEUT%: 82.1 % — ABNORMAL HIGH (ref 38.4–76.8)
Platelets: 200 10*3/uL (ref 145–400)
RBC: 4.46 10*6/uL (ref 3.70–5.45)
RDW: 14 % (ref 11.2–14.5)
WBC: 5.6 10*3/uL (ref 3.9–10.3)

## 2015-05-30 LAB — COMPREHENSIVE METABOLIC PANEL
ALK PHOS: 94 U/L (ref 40–150)
ALT: 12 U/L (ref 0–55)
AST: 29 U/L (ref 5–34)
Albumin: 3.6 g/dL (ref 3.5–5.0)
Anion Gap: 7 mEq/L (ref 3–11)
BUN: 9.3 mg/dL (ref 7.0–26.0)
CHLORIDE: 100 meq/L (ref 98–109)
CO2: 28 meq/L (ref 22–29)
Calcium: 9.2 mg/dL (ref 8.4–10.4)
Creatinine: 0.7 mg/dL (ref 0.6–1.1)
EGFR: 87 mL/min/{1.73_m2} — AB (ref >90)
GLUCOSE: 121 mg/dL (ref 70–140)
POTASSIUM: 4.4 meq/L (ref 3.5–5.1)
SODIUM: 135 meq/L — AB (ref 136–145)
Total Bilirubin: 0.39 mg/dL (ref 0.20–1.20)
Total Protein: 6.7 g/dL (ref 6.4–8.3)

## 2015-05-30 NOTE — Progress Notes (Signed)
[place] Telephone:(336) [phone]   Fax:(336) [phone]  OFFICE PROGRESS NOTE  Pcp Not In System No address on file  DIAGNOSIS: Stage IIB (T3, N0, M0) non-small cell lung cancer, squamous cell carcinoma diagnosed in December 2016 and presenting with large right upper lobe lung mass in addition to a satellite nodule.  PRIOR THERAPY: None  CURRENT THERAPY: Definitive radiotherapy under the care of Dr. Sondra Come status post 3 weeks of treatment.  INTERVAL HISTORY: Amber Santiago 70 y.o. female returns to the clinic today for visit accompanied by her daughter and granddaughter. The patient is currently undergoing a course of curative radiotherapy under the care of Dr. Sondra Come and tolerating her treatment fairly well. She is already status post 3 weeks of radiation. She denied having any significant chest pain, shortness of breath, cough or hemoptysis. She has no fever or chills. She denied having any significant weight loss or night sweats. She has no headache or visual changes. The patient is here today for evaluation and discussion of consideration of concurrent chemotherapy.  MEDICAL HISTORY: Past Medical History  Diagnosis Date  . COPD (chronic obstructive pulmonary disease) ([place])   . Asthma   . Hypertension   . Lung nodule   . Pneumonia   . Hyperthyroidism     PMH  . Umbilical hernia   . Arthritis   . Cancer ([place])     right lung  . Constipation   . 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  MEDICATIONS:  Current Outpatient Prescriptions  Medication Sig Dispense Refill  . bevacizumab (AVASTIN) 1.25 mg/0.1 mL SOLN 1.25 mg by Intravitreal route to Surgery. Reported on 05/24/2015    . budesonide-formoterol (SYMBICORT) 160-4.5 MCG/ACT inhaler INHALE TWO PUFFS INTO LUNGS TWICE DAILY 11 g 11  . GARLIC PO Take 1 capsule by mouth daily. Reported on 05/24/2015    . Acetaminophen (TYLENOL) 325 MG CAPS  Take 500 mg by mouth every 6 (six) hours as needed. Reported on 05/30/2015    . Ascorbic Acid (VITAMIN C) 1000 MG tablet Take 1,000 mg by mouth daily. Reported on 05/30/2015    . cholecalciferol (VITAMIN D) 1000 UNITS tablet Take 2,000 Units by mouth daily. Reported on 05/30/2015    . Cyanocobalamin (B-12 PO) Take 1 tablet by mouth daily. Reported on 05/30/2015    . HYDROcodone-acetaminophen (NORCO) 5-325 MG tablet Take 1 tablet by mouth every 4 (four) hours as needed for moderate pain. (Patient not taking: Reported on 05/30/2015) 30 tablet 0  . LORazepam (ATIVAN) 0.5 MG tablet Take 1 tablet (0.5 mg total) by mouth 2 (two) times daily. (Patient not taking: Reported on 04/21/2015) 10 tablet 0  . Multiple Vitamins-Minerals (PRESERVISION AREDS 2 PO) Take 1 capsule by mouth daily. Reported on 05/30/2015    . Pyridoxine HCl (B-6 PO) Take 1 tablet by mouth daily. Reported on 05/30/2015    . Spacer/Aero-Holding Chambers (AEROCHAMBER MV) inhaler Use as instructed (Patient not taking: Reported on 05/24/2015) 1 each 0  . TURMERIC CURCUMIN PO Take 1 capsule by mouth daily. Reported on 05/30/2015     No current facility-administered medications for this visit.    SURGICAL HISTORY:  Past Surgical History  Procedure Laterality Date  . Video bronchoscopy Bilateral 03/18/2015    Procedure: VIDEO BRONCHOSCOPY WITH FLUORO;  Surgeon: Collene Gobble, MD;  Location: [place];  Service: Cardiopulmonary;  Laterality: Bilateral;  . Appendectomy    . Dilation and  curettage of uterus    . Video bronchoscopy with endobronchial ultrasound N/A 04/12/2015    Procedure: VIDEO BRONCHOSCOPY WITH ENDOBRONCHIAL ULTRASOUND;  Surgeon: Grace Isaac, MD;  Location: [place];  Service: Thoracic;  Laterality: N/A;  . Mediastinoscopy N/A 04/12/2015    Procedure: MEDIASTINOSCOPY;  Surgeon: Grace Isaac, MD;  Location: [facility];  Service: Thoracic;  Laterality: N/A;    REVIEW OF SYSTEMS:  A comprehensive review of systems was negative.    PHYSICAL EXAMINATION: General appearance: alert, cooperative, fatigued and no distress Head: Normocephalic, without obvious abnormality, atraumatic Neck: no adenopathy, no JVD, supple, symmetrical, trachea midline and thyroid not enlarged, symmetric, no tenderness/mass/nodules Lymph nodes: Cervical, supraclavicular, and axillary nodes normal. Resp: wheezes bilaterally Back: symmetric, no curvature. ROM normal. No CVA tenderness. Cardio: regular rate and rhythm, S1, S2 normal, no murmur, click, rub or gallop GI: soft, non-tender; bowel sounds normal; no masses,  no organomegaly Extremities: extremities normal, atraumatic, no cyanosis or edema Neurologic: Alert and oriented X 3, normal strength and tone. Normal symmetric reflexes. Normal coordination and gait  ECOG PERFORMANCE STATUS: 1 - Symptomatic but completely ambulatory  Blood pressure 136/68, pulse 67, temperature 98 F (36.7 C), temperature source Oral, resp. rate 18, height '5\' 4"'$  (1.626 m), weight 136 lb 8 oz (61.916 kg), SpO2 99 %.  LABORATORY DATA: Lab Results  Component Value Date   WBC 5.6 05/30/2015   HGB 13.4 05/30/2015   HCT 39.8 05/30/2015   MCV 89.1 05/30/2015   PLT 200 05/30/2015      Chemistry      Component Value Date/Time   NA 135* 05/30/2015 1053   NA 137 04/06/2015 1242   K 4.4 05/30/2015 1053   K 4.3 04/06/2015 1242   CL 99* 04/06/2015 1242   CO2 28 05/30/2015 1053   CO2 29 04/06/2015 1242   BUN 9.3 05/30/2015 1053   BUN 6 04/06/2015 1242   CREATININE 0.7 05/30/2015 1053   CREATININE 0.61 04/06/2015 1242      Component Value Date/Time   CALCIUM 9.2 05/30/2015 1053   CALCIUM 9.3 04/06/2015 1242   ALKPHOS 94 05/30/2015 1053   ALKPHOS 78 04/06/2015 1242   AST 29 05/30/2015 1053   AST 33 04/06/2015 1242   ALT 12 05/30/2015 1053   ALT 17 04/06/2015 1242   BILITOT 0.39 05/30/2015 1053   BILITOT 0.6 04/06/2015 1242       RADIOGRAPHIC STUDIES: No results found.  ASSESSMENT AND PLAN: This  is a very pleasant 70 years old white female recently diagnosed with a stage IIb non-small cell lung cancer, squamous cell carcinoma presented with large right upper lobe lung mass as well as satellite nodule. Evaluation of the mediastinal lymph nodes with repeat bronchoscopy, endobronchial ultrasound and biopsies as well as mediastinoscopy showed no evidence of metastatic disease to the mediastinal or hilar lymph nodes. The patient is currently undergoing curative radiotherapy and tolerating it well. She status post 3 weeks of treatment. I recommended for the patient to continue her current treatment with radiation as a single modality at this point. She has only 3 more weeks left and I did not see the value of adding concurrent chemotherapy at this point. I will see her back for follow-up visit in 2 months for reevaluation with repeat CT scan of the chest for restaging of her disease. The patient was advised to call immediately if she has any concerning symptoms in the interval. The patient voices understanding of current disease status and treatment options  and is in agreement with the current care plan.  All questions were answered. The patient knows to call the clinic with any problems, questions or concerns. We can certainly see the patient much sooner if necessary.  Disclaimer: This note was dictated with voice recognition software. Similar sounding words can inadvertently be transcribed and may not be corrected upon review.

## 2015-05-30 NOTE — Progress Notes (Signed)
Oncology Nurse Navigator Documentation  Oncology Nurse Navigator Flowsheets 05/30/2015  Navigator Location [facility]  Navigator Encounter Type Clinic/MDC/I spoke with Amber Santiago and her daughter today at [facility].  Her daughter is here from [place] to help take care of her.  [name] will continue her radiation tx and then have a follow up scan in which Dr. Julien Nordmann will discuss if chemo is needed or not.  [name] states she feel good and I thought she looked great today.  She is running late for radiation therapy.  I called [facility] to update them she was coming.   Treatment Phase Treatment  Barriers/Navigation Needs No barriers at this time  Acuity Level 1  Time Spent with Patient 15

## 2015-05-30 NOTE — Patient Instructions (Signed)
Smoking Cessation, Tips for Success If you are ready to quit smoking, congratulations! You have chosen to help yourself be healthier. Cigarettes bring nicotine, tar, carbon monoxide, and other irritants into your body. Your lungs, heart, and blood vessels will be able to work better without these poisons. There are many different ways to quit smoking. Nicotine gum, nicotine patches, a nicotine inhaler, or nicotine nasal spray can help with physical craving. Hypnosis, support groups, and medicines help break the habit of smoking. WHAT THINGS CAN I DO TO MAKE QUITTING EASIER?  Here are some tips to help you quit for good:  Pick a date when you will quit smoking completely. Tell all of your friends and family about your plan to quit on that date.  Do not try to slowly cut down on the number of cigarettes you are smoking. Pick a quit date and quit smoking completely starting on that day.  Throw away all cigarettes.   Clean and remove all ashtrays from your home, work, and car.  On a card, write down your reasons for quitting. Carry the card with you and read it when you get the urge to smoke.  Cleanse your body of nicotine. Drink enough water and fluids to keep your urine clear or pale yellow. Do this after quitting to flush the nicotine from your body.  Learn to predict your moods. Do not let a bad situation be your excuse to have a cigarette. Some situations in your life might tempt you into wanting a cigarette.  Never have "just one" cigarette. It leads to wanting another and another. Remind yourself of your decision to quit.  Change habits associated with smoking. If you smoked while driving or when feeling stressed, try other activities to replace smoking. Stand up when drinking your coffee. Brush your teeth after eating. Sit in a different chair when you read the paper. Avoid alcohol while trying to quit, and try to drink fewer caffeinated beverages. Alcohol and caffeine may urge you to  smoke.  Avoid foods and drinks that can trigger a desire to smoke, such as sugary or spicy foods and alcohol.  Ask people who smoke not to smoke around you.  Have something planned to do right after eating or having a cup of coffee. For example, plan to take a walk or exercise.  Try a relaxation exercise to calm you down and decrease your stress. Remember, you may be tense and nervous for the first 2 weeks after you quit, but this will pass.  Find new activities to keep your hands busy. Play with a pen, coin, or rubber band. Doodle or draw things on paper.  Brush your teeth right after eating. This will help cut down on the craving for the taste of tobacco after meals. You can also try mouthwash.   Use oral substitutes in place of cigarettes. Try using lemon drops, carrots, cinnamon sticks, or chewing gum. Keep them handy so they are available when you have the urge to smoke.  When you have the urge to smoke, try deep breathing.  Designate your home as a nonsmoking area.  If you are a heavy smoker, ask your health care provider about a prescription for nicotine chewing gum. It can ease your withdrawal from nicotine.  Reward yourself. Set aside the cigarette money you save and buy yourself something nice.  Look for support from others. Join a support group or smoking cessation program. Ask someone at home or at work to help you with your plan   to quit smoking.  Always ask yourself, "Do I need this cigarette or is this just a reflex?" Tell yourself, "Today, I choose not to smoke," or "I do not want to smoke." You are reminding yourself of your decision to quit.  Do not replace cigarette smoking with electronic cigarettes (commonly called e-cigarettes). The safety of e-cigarettes is unknown, and some may contain harmful chemicals.  If you relapse, do not give up! Plan ahead and think about what you will do the next time you get the urge to smoke. HOW WILL I FEEL WHEN I QUIT SMOKING? You  may have symptoms of withdrawal because your body is used to nicotine (the addictive substance in cigarettes). You may crave cigarettes, be irritable, feel very hungry, cough often, get headaches, or have difficulty concentrating. The withdrawal symptoms are only temporary. They are strongest when you first quit but will go away within 10-14 days. When withdrawal symptoms occur, stay in control. Think about your reasons for quitting. Remind yourself that these are signs that your body is healing and getting used to being without cigarettes. Remember that withdrawal symptoms are easier to treat than the major diseases that smoking can cause.  Even after the withdrawal is over, expect periodic urges to smoke. However, these cravings are generally short lived and will go away whether you smoke or not. Do not smoke! WHAT RESOURCES ARE AVAILABLE TO HELP ME QUIT SMOKING? Your health care provider can direct you to community resources or hospitals for support, which may include:  Group support.  Education.  Hypnosis.  Therapy.   This information is not intended to replace advice given to you by your health care provider. Make sure you discuss any questions you have with your health care provider.   Document Released: 12/23/2003 Document Revised: 04/16/2014 Document Reviewed: 09/11/2012 Elsevier Interactive Patient Education 2016 [employer]  

## 2015-05-30 NOTE — Telephone Encounter (Signed)
Pt confirmed labs/ov per 02/20 POF, gave pt AVS and Calendar... KJ

## 2015-05-31 ENCOUNTER — Ambulatory Visit
Admission: RE | Admit: 2015-05-31 | Discharge: 2015-05-31 | Disposition: A | Discharge status: Home / Self Care | Payer: Managed Care, Other (non HMO) | Source: Ambulatory Visit | Attending: Radiation Oncology | Admitting: Radiation Oncology

## 2015-05-31 ENCOUNTER — Encounter: Payer: Self-pay | Admitting: Radiation Oncology

## 2015-05-31 VITALS — BP 153/70 | HR 67 | Temp 97.9°F | Ht 64.0 in | Wt 138.1 lb

## 2015-05-31 DIAGNOSIS — C3491 Malignant neoplasm of unspecified part of right bronchus or lung: Secondary | ICD-10-CM

## 2015-05-31 DIAGNOSIS — Z510 Encounter for antineoplastic radiation therapy: Secondary | ICD-10-CM | POA: Diagnosis not present

## 2015-05-31 MED ORDER — FUROSEMIDE 20 MG PO TABS: 20.0000 mg | ORAL_TABLET | Freq: Every day | ORAL | Status: DC

## 2015-05-31 NOTE — Progress Notes (Signed)
  Radiation Oncology         [phone] ________________________________  Name: Amber Santiago MRN: [id number]  Date: 05/31/2015  DOB: 1945-09-17  Weekly Radiation Therapy Management    ICD-9-CM ICD-10-CM   1. 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 . . . . . . . The patient presents for routine under treatment assessment. Amber Santiago has completed 19 fractions to her right chest. She reports the pain in her right side has gone away. She reports having a frequent dry cough and a dry mouth. She denies hemoptysis and a sore throat or difficulty swallowing. She reports that her feet have started to swell and remain swollen all the time. She reports fatigue. She has some questions about her appointment with Dr. Julien Nordmann yesterday. She will have laser surgery on 06/08/15.                                 Set-up films were reviewed.                                 The chart was checked. Physical Findings. . .  height is '5\' 4"'$  (1.626 m) and weight is 138 lb 1.6 oz (62.642 kg). Her oral temperature is 97.9 F (36.6 C). Her blood pressure is 153/70 and her pulse is 67. Her oxygen saturation is 100%.  the lungs are clear. The heart has regular rhythm and rate. Patient has pitting edema in both ankles and feet.  Impression . . . . . . . The patient is tolerating radiation. Plan . . . . . . . . . . . . Continue treatment as planned. Recommended elevation of legs when resting at home. The patient will be given a limited prescription for Lasix.  ________________________________   Blair Promise, PhD, MD

## 2015-05-31 NOTE — Progress Notes (Signed)
Amber Santiago has completed 19 fractions to her right chest.  She reports the pain in her right side has gone away.  She reports having a frequent dry cough and that her mouth is dry.   She denies having hemoptysis and a sore throat/trouble swallowing.  She reports that her feet have started to swell and remain swollen all time.  She reports having fatigue.  She has some questions about her appointment with Dr. Julien Nordmann yesterday.  She will have laser surgery on 06/08/15.  BP 153/70 mmHg  Pulse 67  Temp(Src) 97.9 F (36.6 C) (Oral)  Ht '5\' 4"'$  (1.626 m)  Wt 138 lb 1.6 oz (62.642 kg)  BMI 23.69 kg/m2  SpO2 100%   Wt Readings from Last 3 Encounters:  05/31/15 138 lb 1.6 oz (62.642 kg)  05/30/15 136 lb 8 oz (61.916 kg)  05/24/15 138 lb 1.6 oz (62.642 kg)

## 2015-06-01 ENCOUNTER — Ambulatory Visit
Admission: RE | Admit: 2015-06-01 | Discharge: 2015-06-01 | Disposition: A | Discharge status: Home / Self Care | Payer: Managed Care, Other (non HMO) | Source: Ambulatory Visit | Attending: Radiation Oncology | Admitting: Radiation Oncology

## 2015-06-01 DIAGNOSIS — Z510 Encounter for antineoplastic radiation therapy: Secondary | ICD-10-CM | POA: Diagnosis not present

## 2015-06-02 ENCOUNTER — Ambulatory Visit
Admission: RE | Admit: 2015-06-02 | Discharge: 2015-06-02 | Disposition: A | Discharge status: Home / Self Care | Payer: Managed Care, Other (non HMO) | Source: Ambulatory Visit | Attending: Radiation Oncology | Admitting: Radiation Oncology

## 2015-06-02 DIAGNOSIS — Z510 Encounter for antineoplastic radiation therapy: Secondary | ICD-10-CM | POA: Diagnosis not present

## 2015-06-03 ENCOUNTER — Ambulatory Visit
Admission: RE | Admit: 2015-06-03 | Discharge: 2015-06-03 | Disposition: A | Discharge status: Home / Self Care | Payer: Managed Care, Other (non HMO) | Source: Ambulatory Visit | Attending: Radiation Oncology | Admitting: Radiation Oncology

## 2015-06-03 DIAGNOSIS — Z510 Encounter for antineoplastic radiation therapy: Secondary | ICD-10-CM | POA: Diagnosis not present

## 2015-06-06 ENCOUNTER — Ambulatory Visit
Admission: RE | Admit: 2015-06-06 | Discharge: 2015-06-06 | Disposition: A | Discharge status: Home / Self Care | Payer: Managed Care, Other (non HMO) | Source: Ambulatory Visit | Attending: Radiation Oncology | Admitting: Radiation Oncology

## 2015-06-06 DIAGNOSIS — Z510 Encounter for antineoplastic radiation therapy: Secondary | ICD-10-CM | POA: Diagnosis not present

## 2015-06-07 ENCOUNTER — Ambulatory Visit
Admission: RE | Admit: 2015-06-07 | Discharge: 2015-06-07 | Disposition: A | Discharge status: Home / Self Care | Payer: Managed Care, Other (non HMO) | Source: Ambulatory Visit | Attending: Radiation Oncology | Admitting: Radiation Oncology

## 2015-06-07 ENCOUNTER — Encounter: Payer: Self-pay | Admitting: Radiation Oncology

## 2015-06-07 VITALS — BP 139/65 | HR 74 | Temp 97.9°F | Ht 64.0 in | Wt 138.7 lb

## 2015-06-07 DIAGNOSIS — Z510 Encounter for antineoplastic radiation therapy: Secondary | ICD-10-CM | POA: Diagnosis not present

## 2015-06-07 DIAGNOSIS — C3491 Malignant neoplasm of unspecified part of right bronchus or lung: Secondary | ICD-10-CM | POA: Diagnosis present

## 2015-06-07 DIAGNOSIS — Z923 Personal history of irradiation: Secondary | ICD-10-CM | POA: Diagnosis not present

## 2015-06-07 MED ORDER — HYDROCOD POLST-CPM POLST ER 10-8 MG/5ML PO SUER: 5.0000 mL | Freq: Two times a day (BID) | ORAL | Status: DC | PRN

## 2015-06-07 MED ORDER — RADIAPLEXRX EX GEL
Freq: Once | CUTANEOUS | Status: AC
  Administered 2015-06-07: 15:00:00 via TOPICAL

## 2015-06-07 NOTE — Progress Notes (Signed)
  Radiation Oncology         [phone] ________________________________  Name: Amber Santiago MRN: [id number]  Date: 06/07/2015  DOB: [birth year]  Weekly Radiation Therapy Management    ICD-9-CM ICD-10-CM   1. 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 . . . . . . . The patient presents for routine under treatment assessment.                                 Amber Santiago has completed 23 fractions to her right chest. She denies having pain. She reports having a more frequent dry cough. She denies having a sore throat/trouble swallowing. She reports having a dry mouth for 2 weeks and thinks it may be due to her eye drops. She denies having shortness of breath. She reports having fatigue. She will eye surgery tomorrow morning. She continues to have swelling in her feet. She has been taking stinging nettle and has not taken lasix yet. The skin on her right upper chest and right upper back is red. She has given a refill of radiaplex. j                                 Set-up films were reviewed.                                 The chart was checked. Physical Findings. . .  height is '5\' 4"'$  (1.626 m) and weight is 138 lb 11.2 oz (62.914 kg). Her oral temperature is 97.9 F (36.6 C). Her blood pressure is 139/65 and her pulse is 74. Her oxygen saturation is 98%. . The lungs are clear. The heart has a regular rhythm and rate. The right upper back region shows erythema but no skin breakdown. Impression . . . . . . . The patient is tolerating radiation. Plan . . . . . . . . . . . . Continue treatment as planned. A prescription for Tussionex is been given to the patient for cough.  ________________________________   Blair Promise, PhD, MD

## 2015-06-07 NOTE — Progress Notes (Signed)
Amber Santiago has completed 23 fractions to her right chest.  She denies having pain.  She reports having a more frequent dry cough.  She denies having a sore throat/trouble swallowing.  She reports having a dry mouth for 2 weeks and thinks it may be due to her eye drops.  She denies having shortness of breath.  She reports having fatigue.  She will eye surgery tomorrow morning.  She continues to have swelling in her feet.  She has been taking stinging nettle and has not taken lasix yet.  The skin on her right upper chest and right upper back is red.  She also has broken blood vessels on her right upper back.  She has given a refill of radiaplex.    BP 139/65 mmHg  Pulse 74  Temp(Src) 97.9 F (36.6 C) (Oral)  Ht '5\' 4"'$  (1.626 m)  Wt 138 lb 11.2 oz (62.914 kg)  BMI 23.80 kg/m2  SpO2 98%

## 2015-06-08 ENCOUNTER — Ambulatory Visit
Admission: RE | Admit: 2015-06-08 | Discharge: 2015-06-08 | Disposition: A | Discharge status: Home / Self Care | Payer: Managed Care, Other (non HMO) | Source: Ambulatory Visit | Attending: Radiation Oncology | Admitting: Radiation Oncology

## 2015-06-08 DIAGNOSIS — Z510 Encounter for antineoplastic radiation therapy: Secondary | ICD-10-CM | POA: Diagnosis not present

## 2015-06-09 ENCOUNTER — Ambulatory Visit
Admission: RE | Admit: 2015-06-09 | Discharge: 2015-06-09 | Disposition: A | Discharge status: Home / Self Care | Payer: Managed Care, Other (non HMO) | Source: Ambulatory Visit | Attending: Radiation Oncology | Admitting: Radiation Oncology

## 2015-06-09 DIAGNOSIS — Z510 Encounter for antineoplastic radiation therapy: Secondary | ICD-10-CM | POA: Diagnosis not present

## 2015-06-10 ENCOUNTER — Ambulatory Visit
Admission: RE | Admit: 2015-06-10 | Discharge: 2015-06-10 | Disposition: A | Discharge status: Home / Self Care | Payer: Managed Care, Other (non HMO) | Source: Ambulatory Visit | Attending: Radiation Oncology | Admitting: Radiation Oncology

## 2015-06-10 DIAGNOSIS — Z510 Encounter for antineoplastic radiation therapy: Secondary | ICD-10-CM | POA: Diagnosis not present

## 2015-06-13 ENCOUNTER — Ambulatory Visit
Admission: RE | Admit: 2015-06-13 | Discharge: 2015-06-13 | Disposition: A | Discharge status: Home / Self Care | Payer: Managed Care, Other (non HMO) | Source: Ambulatory Visit | Attending: Radiation Oncology | Admitting: Radiation Oncology

## 2015-06-13 DIAGNOSIS — Z510 Encounter for antineoplastic radiation therapy: Secondary | ICD-10-CM | POA: Diagnosis not present

## 2015-06-14 ENCOUNTER — Ambulatory Visit
Admission: RE | Admit: 2015-06-14 | Discharge: 2015-06-14 | Disposition: A | Discharge status: Home / Self Care | Payer: Managed Care, Other (non HMO) | Source: Ambulatory Visit | Attending: Radiation Oncology | Admitting: Radiation Oncology

## 2015-06-14 ENCOUNTER — Encounter: Payer: Self-pay | Admitting: Radiation Oncology

## 2015-06-14 VITALS — BP 129/80 | HR 81 | Temp 97.9°F | Resp 18 | Ht 64.0 in | Wt 135.0 lb

## 2015-06-14 DIAGNOSIS — Z923 Personal history of irradiation: Secondary | ICD-10-CM | POA: Insufficient documentation

## 2015-06-14 DIAGNOSIS — C3491 Malignant neoplasm of unspecified part of right bronchus or lung: Secondary | ICD-10-CM | POA: Insufficient documentation

## 2015-06-14 DIAGNOSIS — Z510 Encounter for antineoplastic radiation therapy: Secondary | ICD-10-CM | POA: Diagnosis not present

## 2015-06-14 MED ORDER — RADIAPLEXRX EX GEL
Freq: Once | CUTANEOUS | Status: AC
  Administered 2015-06-14: 17:00:00 via TOPICAL

## 2015-06-14 NOTE — Progress Notes (Signed)
Amber Santiago has completed 29 fractions to her right chest.  She denies having pain.  She continues to have a frequent dry cough.  She has tried tussionex which helps her sleep.  She does not want to take it during the day.  She denies having a sore throat/trouble swallowing.  She has taken all of her lasix and her feet/ankles are still swollen.  She is wondering if she should keep taking the lasix.  The skin on her right upper back is red.  She is using radiaplex.  She has been advised to keep applying it for 2 weeks and has been given another tube.  She has also been given a one month follow up appointment.  BP 129/80 mmHg  Pulse 81  Temp(Src) 97.9 F (36.6 C) (Oral)  Resp 18  Ht '5\' 4"'$  (1.626 m)  Wt 135 lb (61.236 kg)  BMI 23.16 kg/m2  SpO2 100%   Wt Readings from Last 3 Encounters:  06/14/15 135 lb (61.236 kg)  06/07/15 138 lb 11.2 oz (62.914 kg)  05/31/15 138 lb 1.6 oz (62.642 kg)

## 2015-06-14 NOTE — Addendum Note (Signed)
Encounter addended by: Gery Pray, MD on: 06/14/2015  6:25 PM<BR>     Documentation filed: [employer], Orders

## 2015-06-14 NOTE — Progress Notes (Signed)
  Radiation Oncology         [phone] ________________________________  Name: Amber Santiago MRN: [id number]  Date: 06/14/2015  DOB: 1945/10/03  Weekly Radiation Therapy Management    ICD-9-CM ICD-10-CM   1. 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 . . . . . . . The patient presents for routine under treatment assessment.                                Amber Santiago has completed 29 fractions to her right chest. She denies having pain. She continues to have a frequent dry cough. She has tried tussionex which helps her sleep. She does not want to take it during the day. She denies having a sore throat/trouble swallowing. She has taken all of her lasix and her feet/ankles are still swollen. She is wondering if she should keep taking the lasix. The skin on her right upper back is red. She is using radiaplex. She has been advised to keep applying it for 2 weeks and has been given another tube. She has also been given a one month follow up appointment.                                    Set-up films were reviewed.                                 The chart was checked. Physical Findings. . .  height is '5\' 4"'$  (1.626 m) and weight is 135 lb (61.236 kg). Her oral temperature is 97.9 F (36.6 C). Her blood pressure is 129/80 and her pulse is 81. Her respiration is 18 and oxygen saturation is 100%. . 3 pound weight loss over the past week.  The lungs are clear. The heart has a regular rhythm and rate. Impression . . . . . . . The patient is tolerating radiation. Plan . . . . . . . . . . . . Continue treatment as planned.  ________________________________   Blair Promise, PhD, MD

## 2015-06-15 ENCOUNTER — Ambulatory Visit
Admission: RE | Admit: 2015-06-15 | Discharge: 2015-06-15 | Disposition: A | Discharge status: Home / Self Care | Payer: Managed Care, Other (non HMO) | Source: Ambulatory Visit | Attending: Radiation Oncology | Admitting: Radiation Oncology

## 2015-06-15 DIAGNOSIS — Z510 Encounter for antineoplastic radiation therapy: Secondary | ICD-10-CM | POA: Diagnosis not present

## 2015-06-16 ENCOUNTER — Ambulatory Visit
Admission: RE | Admit: 2015-06-16 | Discharge: 2015-06-16 | Disposition: A | Discharge status: Home / Self Care | Payer: Managed Care, Other (non HMO) | Source: Ambulatory Visit | Attending: Radiation Oncology | Admitting: Radiation Oncology

## 2015-06-16 DIAGNOSIS — Z510 Encounter for antineoplastic radiation therapy: Secondary | ICD-10-CM | POA: Diagnosis not present

## 2015-06-17 ENCOUNTER — Encounter: Payer: Self-pay | Admitting: Radiation Oncology

## 2015-06-17 ENCOUNTER — Ambulatory Visit
Admission: RE | Admit: 2015-06-17 | Discharge: 2015-06-17 | Disposition: A | Discharge status: Home / Self Care | Payer: Managed Care, Other (non HMO) | Source: Ambulatory Visit | Attending: Radiation Oncology | Admitting: Radiation Oncology

## 2015-06-17 ENCOUNTER — Ambulatory Visit: Payer: Managed Care, Other (non HMO)

## 2015-06-17 VITALS — BP 139/73 | HR 69 | Temp 97.8°F | Ht 64.0 in | Wt 135.3 lb

## 2015-06-17 DIAGNOSIS — Z923 Personal history of irradiation: Secondary | ICD-10-CM | POA: Insufficient documentation

## 2015-06-17 DIAGNOSIS — C3490 Malignant neoplasm of unspecified part of unspecified bronchus or lung: Secondary | ICD-10-CM

## 2015-06-17 DIAGNOSIS — Z510 Encounter for antineoplastic radiation therapy: Secondary | ICD-10-CM | POA: Diagnosis not present

## 2015-06-17 MED ORDER — SONAFINE EX EMUL
1.0000 "application " | Freq: Once | CUTANEOUS | Status: AC
  Administered 2015-06-17: 1 via TOPICAL
  Filled 2015-06-17: qty 45

## 2015-06-17 NOTE — Addendum Note (Signed)
Encounter addended by: Jacqulyn Liner, RN on: 06/17/2015  2:37 PM<BR>     Documentation filed: Medications, [employer]x [employer], Orders

## 2015-06-17 NOTE — Progress Notes (Signed)
[name] has completed treatment with 32 fractions to her right chest.  She denies having pain and an increase in shortness of breath.  She reports her cough is better.  She said the cough is dry.  She denies having trouble swallowing or a sore throat. The skin on her upper right back is red.  She is using radiaplex.  Her lower legs/feet remain swollen.  She says she has an apt with the lymphedema clinic on Wednesday.  She denies having fatigue.  BP 139/73 mmHg  Pulse 69  Temp(Src) 97.8 F (36.6 C) (Oral)  Ht '5\' 4"'$  (1.626 m)  Wt 135 lb 4.8 oz (61.372 kg)  BMI 23.21 kg/m2  SpO2 99%   Wt Readings from Last 3 Encounters:  06/17/15 135 lb 4.8 oz (61.372 kg)  06/14/15 135 lb (61.236 kg)  06/07/15 138 lb 11.2 oz (62.914 kg)

## 2015-06-17 NOTE — Progress Notes (Signed)
  Radiation Oncology         (336) [phone] ________________________________  Name: Amber Santiago MRN: [id number]  Date: 06/17/2015  DOB: [birth year]  Weekly Radiation Therapy Management    ICD-9-CM ICD-10-CM   1. Squamous cell lung cancer, unspecified laterality (HCC) 162.9 C34.90     Current Dose: 64 Gy     Planned Dose:  64 Gy  Narrative: The patient presents for routine under treatment assessment.  CBCT/MVCT images/Port film x-rays were reviewed.  The chart was checked. Amber Santiago has completed treatment with 32 fractions to her right chest. She denies having pain and an increase in shortness of breath. She reports her cough is better. She said the cough is dry. She denies having trouble swallowing or a sore throat. The skin on her upper right back is red. She is using radiaplex. Her lower legs/feet remain swollen. She says she has an apt with the lymphedema clinic on Wednesday. She denies having fatigue. Set-up films were reviewed. The chart was checked.  Physical Findings:   height is '5\' 4"'$  (1.626 m) and weight is 135 lb 4.8 oz (61.372 kg). Her oral temperature is 97.8 F (36.6 C). Her blood pressure is 139/73 and her pulse is 69. Her oxygen saturation is 99%. . The lungs are clear. The heart has a regular rhythm and rate.  Erythema over right upper back  Impression: The patient has tolerated radiation.  Plan:  I gave the patient Sonafine cream for skin. She will follow up with Dr. Sondra Santiago on 07/29/2015.   ________________________________    Amber Gibson, MD   This document serves as a record of services personally performed by Amber Gibson, MD. It was created on her behalf by Jenell Milliner, a trained medical scribe. The creation of this record is based on the scribe's personal observations and the provider's statements to them. This document has been checked and approved by the attending provider

## 2015-06-17 NOTE — Addendum Note (Signed)
Encounter addended by: Jacqulyn Liner, RN on: 06/17/2015  2:39 PM<BR>     Documentation filed: Inpatient MAR

## 2015-06-17 NOTE — Addendum Note (Signed)
Encounter addended by: Joen Laura, [facility] on: 06/17/2015  2:40 PM<BR>     Documentation filed: Rx Order Verification

## 2015-06-20 ENCOUNTER — Encounter (INDEPENDENT_AMBULATORY_CARE_PROVIDER_SITE_OTHER): Payer: Managed Care, Other (non HMO) | Admitting: Ophthalmology

## 2015-06-20 ENCOUNTER — Ambulatory Visit: Payer: Managed Care, Other (non HMO)

## 2015-06-20 DIAGNOSIS — H353231 Exudative age-related macular degeneration, bilateral, with active choroidal neovascularization: Secondary | ICD-10-CM | POA: Diagnosis not present

## 2015-06-20 DIAGNOSIS — H43813 Vitreous degeneration, bilateral: Secondary | ICD-10-CM

## 2015-06-21 ENCOUNTER — Telehealth: Payer: Self-pay | Admitting: Oncology

## 2015-06-21 NOTE — Telephone Encounter (Signed)
[name] called and said that Amber Santiago is having lower back pain that she had on and off for about 3 years.  He is wondering if they can use an asian cream on this area.  Called him back and advised him that Amber Santiago can use the cream on her lower back but not to apply it to her upper back or chest.  [name] verbalized agreement and understanding.

## 2015-06-22 ENCOUNTER — Ambulatory Visit: Payer: Managed Care, Other (non HMO) | Attending: Radiation Oncology | Admitting: Physical Therapy

## 2015-06-24 ENCOUNTER — Encounter: Payer: Self-pay | Admitting: Radiation Oncology

## 2015-06-24 NOTE — Progress Notes (Signed)
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  No new respiratory issues or other symptoms. The skin is erythematous and somewhat raised in a patch.  I offered to see her early this PM but they have other commitments. Therefore, I recommended they apply hydrocortisone 1% cream to the affected area TID for the next few days and to call if it doesn't improve in 72 hours. If it's still a concern we can move up her appt with Dr. Sondra Come.  They are pleased w/ this plan.  -----------------------------------  Eppie Gibson, MD

## 2015-06-28 ENCOUNTER — Telehealth: Payer: Self-pay | Admitting: Oncology

## 2015-06-28 NOTE — Telephone Encounter (Signed)
Patient's husband called and said that Arbie Cookey has been having nausea, neck pain and has been lethargic for the past 3 days.  Made a follow up appointment to see Dr. Sondra Come for tomorrow.

## 2015-06-29 ENCOUNTER — Inpatient Hospital Stay
Admission: RE | Admit: 2015-06-29 | Payer: Managed Care, Other (non HMO) | Source: Ambulatory Visit | Admitting: Radiation Oncology

## 2015-06-29 ENCOUNTER — Ambulatory Visit
Admission: RE | Admit: 2015-06-29 | Payer: Managed Care, Other (non HMO) | Source: Ambulatory Visit | Admitting: Radiation Oncology

## 2015-06-29 ENCOUNTER — Telehealth: Payer: Self-pay | Admitting: Oncology

## 2015-06-29 NOTE — Telephone Encounter (Addendum)
[name] called and said he does not think that Amber Santiago will be able to come for her follow up appointment today.  He said she is very lethargic and does not have the energy to get dressed.  She does not have a fever and is not short of breath.  She is coughing but a little less than during radiation.  [name] said he would call if Amber Santiago decides to come in.

## 2015-06-29 NOTE — Progress Notes (Signed)
  Radiation Oncology         (336) [phone] ________________________________  Name: Amber Santiago MRN: [id number]  Date: 06/17/2015  DOB: 06-Jun-1945  End of Treatment Note   Diagnosis:   Stage IIB (T3, N0, M0), squamous cell carcinoma the right lung     Indication for treatment:  Definitive treatment       Radiation treatment dates:   05/05/2015-06/17/2015  Site/dose:   Right central chest area 64 gray in 32 fractions  Beams/energy:   3-D conformal, 6 and 10 x Beams  Narrative: The patient tolerated radiation treatment relatively well.   She experienced some fatigue during the course for treatment. Her breathing remained stable. Her coughing improved during the course of treatment.  Plan: The patient has completed radiation treatment. The patient will return to radiation oncology clinic for routine followup in one month. I advised them to call or return sooner if they have any questions or concerns related to their recovery or treatment.  -----------------------------------  Blair Promise, PhD, MD

## 2015-07-04 ENCOUNTER — Ambulatory Visit (HOSPITAL_COMMUNITY)
Admission: RE | Admit: 2015-07-04 | Discharge: 2015-07-04 | Disposition: A | Discharge status: Home / Self Care | Payer: Managed Care, Other (non HMO) | Source: Ambulatory Visit | Attending: Radiation Oncology | Admitting: Radiation Oncology

## 2015-07-04 ENCOUNTER — Telehealth: Payer: Self-pay | Admitting: Oncology

## 2015-07-04 ENCOUNTER — Other Ambulatory Visit: Payer: Self-pay | Admitting: Oncology

## 2015-07-04 ENCOUNTER — Encounter: Payer: Self-pay | Admitting: Radiation Oncology

## 2015-07-04 ENCOUNTER — Ambulatory Visit
Admission: RE | Admit: 2015-07-04 | Discharge: 2015-07-04 | Disposition: A | Discharge status: Home / Self Care | Payer: Managed Care, Other (non HMO) | Source: Ambulatory Visit | Attending: Radiation Oncology | Admitting: Radiation Oncology

## 2015-07-04 VITALS — BP 136/63 | HR 92 | Temp 98.1°F | Ht 64.0 in | Wt 130.7 lb

## 2015-07-04 DIAGNOSIS — C3491 Malignant neoplasm of unspecified part of right bronchus or lung: Secondary | ICD-10-CM | POA: Diagnosis not present

## 2015-07-04 DIAGNOSIS — J449 Chronic obstructive pulmonary disease, unspecified: Secondary | ICD-10-CM | POA: Diagnosis not present

## 2015-07-04 DIAGNOSIS — R918 Other nonspecific abnormal finding of lung field: Secondary | ICD-10-CM | POA: Insufficient documentation

## 2015-07-04 DIAGNOSIS — C3490 Malignant neoplasm of unspecified part of unspecified bronchus or lung: Secondary | ICD-10-CM

## 2015-07-04 LAB — CBC WITH DIFFERENTIAL/PLATELET
BASO%: 1.3 % (ref 0.0–2.0)
Basophils Absolute: 0.1 10*3/uL (ref 0.0–0.1)
EOS%: 5 % (ref 0.0–7.0)
Eosinophils Absolute: 0.3 10*3/uL (ref 0.0–0.5)
HCT: 40 % (ref 34.8–46.6)
HGB: 13.3 g/dL (ref 11.6–15.9)
LYMPH%: 11 % — AB (ref 14.0–49.7)
MCH: 29.6 pg (ref 25.1–34.0)
MCHC: 33.3 g/dL (ref 31.5–36.0)
MCV: 88.9 fL (ref 79.5–101.0)
MONO#: 0.6 10*3/uL (ref 0.1–0.9)
MONO%: 10.4 % (ref 0.0–14.0)
NEUT%: 72.3 % (ref 38.4–76.8)
NEUTROS ABS: 3.9 10*3/uL (ref 1.5–6.5)
Platelets: 315 10*3/uL (ref 145–400)
RBC: 4.5 10*6/uL (ref 3.70–5.45)
RDW: 13.6 % (ref 11.2–14.5)
WBC: 5.4 10*3/uL (ref 3.9–10.3)
lymph#: 0.6 10*3/uL — ABNORMAL LOW (ref 0.9–3.3)

## 2015-07-04 LAB — BASIC METABOLIC PANEL
Anion Gap: 9 mEq/L (ref 3–11)
BUN: 7.3 mg/dL (ref 7.0–26.0)
CALCIUM: 9.1 mg/dL (ref 8.4–10.4)
CHLORIDE: 92 meq/L — AB (ref 98–109)
CO2: 30 mEq/L — ABNORMAL HIGH (ref 22–29)
CREATININE: 0.7 mg/dL (ref 0.6–1.1)
EGFR: 83 mL/min/{1.73_m2} — AB (ref >90)
Glucose: 182 mg/dl — ABNORMAL HIGH (ref 70–140)
Potassium: 3.9 mEq/L (ref 3.5–5.1)
SODIUM: 132 meq/L — AB (ref 136–145)

## 2015-07-04 NOTE — Progress Notes (Signed)
Amber Santiago here for follow up.  She reports having pain at a 3/10 in her mid chest area.  She also reports having pain in her left hip down her left leg.  She said it started on Thursday.  She reports feeling very short of breath.  She reports having severe fatigue and is not sleeping well at night.  She is getting up frequently to urinate and only urinating small amounts.  She denies having a cough, sore throat or trouble swallowing.  She reports she has not felt like eating.  She reports having occasional nausea.  She has lost 5 lbs since 06/17/15.  Orthostatic vitals taken: bp sitting 136/63, hr 92, bp standing 115/60, hr 93.  BP 136/63 mmHg  Pulse 92  Temp(Src) 98.1 F (36.7 C) (Oral)  Ht '5\' 4"'$  (1.626 m)  Wt 130 lb 11.2 oz (59.285 kg)  BMI 22.42 kg/m2  SpO2 95%   Wt Readings from Last 3 Encounters:  07/04/15 130 lb 11.2 oz (59.285 kg)  06/17/15 135 lb 4.8 oz (61.372 kg)  06/14/15 135 lb (61.236 kg)

## 2015-07-04 NOTE — Telephone Encounter (Signed)
Amber Santiago called and said Amber Santiago is still not feeling well.  She was feeling better on Saturday but felt worse again yesterday.  Arranged for Amber Santiago to come in for labs at 3:30 and then will be worked in to see Dr. Sondra Come after that.

## 2015-07-04 NOTE — Progress Notes (Signed)
Radiation Oncology         (336) [phone] ________________________________  Name: Amber Santiago MRN: [id number]  Date: 07/04/2015  DOB: 1946-01-26  Follow-Up Visit Note  CC: Pcp Not In System  Collene Gobble, MD    ICD-9-CM ICD-10-CM   1. 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  Patient is seen earlier than her scheduled follow-up appointment. She has been complaining of severe fatigue shortness of breath and some coughing. Overall however the patient's coughing improved. She denies any hemoptysis. The patient admits that her breathing becomes more difficult when lying flat. She also admits to some anxiety contributing to her breathing problems. She denies any headaches.  She has had some mild nausea.                              ALLERGIES:  is allergic to amoxicillin and penicillins.  Meds: Current Outpatient Prescriptions  Medication Sig Dispense Refill  . bevacizumab (AVASTIN) 1.25 mg/0.1 mL SOLN 1.25 mg by Intravitreal route to Surgery. Reported on 05/24/2015    . budesonide-formoterol (SYMBICORT) 160-4.5 MCG/ACT inhaler INHALE TWO PUFFS INTO LUNGS TWICE DAILY 11 g 11  . HYDROcodone-acetaminophen (NORCO) 5-325 MG tablet Take 1 tablet by mouth every 4 (four) hours as needed for moderate pain. 30 tablet 0  . Acetaminophen (TYLENOL) 325 MG CAPS Take 500 mg by mouth every 6 (six) hours as needed. Reported on 07/04/2015    . Ascorbic Acid (VITAMIN C) 1000 MG tablet Take 1,000 mg by mouth daily. Reported on 07/04/2015    . chlorpheniramine-HYDROcodone (TUSSIONEX) 10-8 MG/5ML SUER Take 5 mLs by mouth every 12 (twelve) hours as needed for cough. (Patient not taking: Reported on 06/17/2015) 140 mL 0  . cholecalciferol (VITAMIN D) 1000 UNITS tablet Take 2,000 Units by mouth daily. Reported on 07/04/2015    . Cyanocobalamin  (B-12 PO) Take 1 tablet by mouth daily. Reported on 07/04/2015    . furosemide (LASIX) 20 MG tablet Take 1 tablet (20 mg total) by mouth daily. (Patient not taking: Reported on 07/04/2015) 3 tablet 1  . GARLIC PO Take 1 capsule by mouth daily. Reported on 07/04/2015    . LORazepam (ATIVAN) 0.5 MG tablet Take 1 tablet (0.5 mg total) by mouth 2 (two) times daily. (Patient not taking: Reported on 06/14/2015) 10 tablet 0  . Multiple Vitamins-Minerals (PRESERVISION AREDS 2 PO) Take 1 capsule by mouth daily. Reported on 07/04/2015    . Pyridoxine HCl (B-6 PO) Take 1 tablet by mouth daily. Reported on 07/04/2015    . Spacer/Aero-Holding Chambers (AEROCHAMBER MV) inhaler Use as instructed (Patient not taking: Reported on 05/24/2015) 1 each 0  . Stinging Nettle 2 % POWD by Does not apply route daily. Reported on 07/04/2015    . TURMERIC CURCUMIN PO Take 1 capsule by mouth daily. Reported on 07/04/2015    . Wound Dressings (SONAFINE EX) Apply topically. Reported on 07/04/2015     No current facility-administered medications for this encounter.    Physical Findings: The patient is in no acute distress. Patient is alert and oriented.  height is '5\' 4"'$  (1.626 m) and weight is 130 lb 11.2 oz (59.285 kg). Her oral temperature is 98.1 F (36.7 C). Her blood pressure is 136/63 and her pulse  is 92. Her oxygen saturation is 95%. .  Examination of the neck and supraclavicular region reveals no evidence of adenopathy. The axillary areas are free of adenopathy. The lungs are clear. The heart has a regular rhythm and rate.  Lab Findings: Lab Results  Component Value Date   WBC 5.4 07/04/2015   HGB 13.3 07/04/2015   HCT 40.0 07/04/2015   MCV 88.9 07/04/2015   PLT 315 07/04/2015    Radiographic Findings: No results found.  Impression: Stage IIB (T3, N0, M0) squamous cell carcinoma the lung. The patient appears to have a delayed Response to her treatment in terms of fatigue. She potentially could have some radiation  pneumonitis would be explaining some of her shortness of breath. Bloodwork from earlier today shows no obvious signs of infection. Sodium and chloride were somewhat low. She denies salt restriction in her diet.  Plan:  Patient will proceed with a chest x-ray this afternoon. She is scheduled to see her pulmonologist  Dr. Lamonte Sakai tomorrow. I  Would Consider placement patient on steroids for concern she may have some radiation pneumonitis contributing to her shortness of breath but we'll wait on this prescription until the patient sees Dr. Lamonte Sakai tomorrow for further evaluation. Patient is to keep her  scheduled follow-up appointments in April after her CT scan of the chest with Dr. Julien Nordmann and myself.  ____________________________________ Gery Pray, MD

## 2015-07-05 ENCOUNTER — Encounter: Payer: Self-pay | Admitting: Emergency Medicine

## 2015-07-05 ENCOUNTER — Ambulatory Visit (INDEPENDENT_AMBULATORY_CARE_PROVIDER_SITE_OTHER): Payer: Managed Care, Other (non HMO) | Admitting: Emergency Medicine

## 2015-07-05 VITALS — BP 108/64 | HR 87 | Wt 138.0 lb

## 2015-07-05 DIAGNOSIS — R5383 Other fatigue: Secondary | ICD-10-CM

## 2015-07-05 DIAGNOSIS — J449 Chronic obstructive pulmonary disease, unspecified: Secondary | ICD-10-CM | POA: Diagnosis not present

## 2015-07-05 NOTE — Assessment & Plan Note (Signed)
No current evidence that she is experiencing an acute exacerbation of her COPD. Her underlying lung disease is severe and certainly this could contribute to decreased function and to fatigue following radiation treatment. We will need to treat her for an acute exacerbation of COPD at this time. We will continue her current bronchodilators as ordered

## 2015-07-05 NOTE — Patient Instructions (Addendum)
We will hold off on starting any prednisone for now because your CXR does not show significant changes consistent with radiation pneumonitis.  Please continue your inhaled medications as you have been taking them  If your breathing or energy get worse then we will plan to perform your CT Chest sooner than April to look for any other evidence for radiation changes.  Try using melatonin as needed to help with sleep.  Follow with Dr Lamonte Sakai next available visit.

## 2015-07-05 NOTE — Progress Notes (Signed)
Subjective:    Patient ID: Amber Santiago, female    DOB: 03-Mar-1946, 70 y.o.   MRN: [id number]  HPI  70 year old smoker with a history of COPD and hypertension. I followed her for several years for these problems. She was diagnosed right upper lobe needle biopsies with squamous cell lung cancer in the end of 2016. She is currently undergoing radiation therapy, most recent treatment was 2 weeks ago. Been having significant dyspnea with exertion and also when lying flat for the last 10 days. She also has had some mild nausea and has midsternal discomfort. She is feeling very fatigued. No cough, minimal wheeze. She may decide to do post radiation chemotherapy but has not had any chemotherapy yet. There was some question on her evaluation on 3/27 that some of her symptoms may be due to radiation pneumonitis. I have reviewed chest x-ray from 3/27 that does not appear to show any significant change in infiltrate, the RUL looks a bit more clear than prior film.    No flowsheet data found.   Review of Systems  Constitutional: Negative for fever and unexpected weight change.  HENT: Positive for sinus pressure. Negative for congestion, dental problem, ear pain, nosebleeds, postnasal drip, rhinorrhea, sneezing, sore throat and trouble swallowing.   Eyes: Negative for redness and itching.  Respiratory: Positive for cough and shortness of breath. Negative for chest tightness and wheezing.   Cardiovascular: Negative for palpitations and leg swelling.  Gastrointestinal: Negative for nausea and vomiting.  Genitourinary: Negative for dysuria.  Musculoskeletal: Negative for joint swelling.  Skin: Negative for rash.  Neurological: Negative for headaches.  Hematological: Does not bruise/bleed easily.  Psychiatric/Behavioral: The patient is nervous/anxious.       Objective:   Physical Exam Filed Vitals:   07/05/15 1614  BP: 108/64  Pulse: 87  Weight: 138 lb (62.596 kg)  SpO2: 94%   Gen: Pleasant,  well-nourished, in no distress,  normal affect  ENT: No lesions,  mouth clear,  oropharynx clear, no postnasal drip  Neck: No JVD, no TMG, no carotid bruits  Lungs: No use of accessory muscles, clear without rales or rhonchi, distant  Cardiovascular: RRR, heart sounds normal, no murmur or gallops, no peripheral edema  Musculoskeletal: No deformities, no cyanosis or clubbing  Neuro: alert, non focal  Skin: Warm, no lesions or rashes  CXR  07/04/15 --  COMPARISON: 04/12/2015  FINDINGS: Normal heart size, mediastinal contours and pulmonary vascularity.  Emphysematous changes with biapical scarring.  Persistent RIGHT upper lobe opacity likely related to known suprahilar tumor.  Accentuated markings RIGHT upper lobe slightly improved since previous exam question improved infiltrate.  Remaining lungs grossly clear.  No pleural effusion or pneumothorax.  Diffuse osseous demineralization.  IMPRESSION: COPD changes with biapical scarring.  Known RIGHT suprahilar tumor.  Improved RIGHT upper lobe infiltrate since 04/12/2015.      Assessment & Plan:  COPD (chronic obstructive pulmonary disease) No current evidence that she is experiencing an acute exacerbation of her COPD. Her underlying lung disease is severe and certainly this could contribute to decreased function and to fatigue following radiation treatment. We will need to treat her for an acute exacerbation of COPD at this time. We will continue her current bronchodilators as ordered  Fatigue Agree that she is at risk for radiation pneumonitis and that certainly this could contribute to her generalized symptoms of dyspnea and fatigue. Her chest x-ray from 3/27 and reassuring and in fact looks somewhat better than the one done in January. I think  at this time we should defer treatment for possible radiation pneumonitis and follow her clinically. If she worsens in any way then I would support checking her CT scan  of the chest early to look for any evolving parenchymal changes.

## 2015-07-05 NOTE — Assessment & Plan Note (Signed)
Agree that she is at risk for radiation pneumonitis and that certainly this could contribute to her generalized symptoms of dyspnea and fatigue. Her chest x-ray from 3/27 and reassuring and in fact looks somewhat better than the one done in January. I think at this time we should defer treatment for possible radiation pneumonitis and follow her clinically. If she worsens in any way then I would support checking her CT scan of the chest early to look for any evolving parenchymal changes.

## 2015-07-08 ENCOUNTER — Telehealth: Payer: Self-pay | Admitting: Oncology

## 2015-07-08 ENCOUNTER — Telehealth: Payer: Self-pay | Admitting: Emergency Medicine

## 2015-07-08 MED ORDER — AZITHROMYCIN 250 MG PO TABS: ORAL_TABLET | ORAL | Status: DC

## 2015-07-08 MED ORDER — PREDNISONE 10 MG PO TABS: ORAL_TABLET | ORAL | Status: DC

## 2015-07-08 NOTE — Telephone Encounter (Signed)
Please have her take pred taper > Take '40mg'$  daily for 3 days, then '30mg'$  daily for 3 days, then '20mg'$  daily for 3 days, then '10mg'$  daily for 3 days, then stop Also have her take azithro z-pack

## 2015-07-08 NOTE — Telephone Encounter (Addendum)
Amber Santiago called and said that Amber Santiago has had more shortness of breath in the last couple of days.  She is wondering if she can get the steroid that Dr. Sondra Come mentioned at her follow up appointment.  He said she now has only an occasional cough and does not have a temperature.

## 2015-07-08 NOTE — Telephone Encounter (Signed)
Per 07/05/15 OV: Patient Instructions       We will hold off on starting any prednisone for now because your CXR does not show significant changes consistent with radiation pneumonitis.   Please continue your inhaled medications as you have been taking them   If your breathing or energy get worse then we will plan to perform your CT Chest sooner than April to look for any other evidence for radiation changes.   Try using melatonin as needed to help with sleep.  Follow with Dr Lamonte Sakai next available visit.    --  Called spoke with pt spouse. He reports pt is still complaining about being SOB. Reports it is getting worse x teh past few days. They are wanting something called in to help with her breathing. Also per spouse pt does not want a sooner CT scan than April.   Please advise RB thanks

## 2015-07-08 NOTE — Telephone Encounter (Signed)
Called spoke with spouse and is aware of recs. RX's sent in. Nothing further needed

## 2015-07-14 ENCOUNTER — Encounter (INDEPENDENT_AMBULATORY_CARE_PROVIDER_SITE_OTHER): Payer: Managed Care, Other (non HMO) | Admitting: Ophthalmology

## 2015-07-18 ENCOUNTER — Telehealth: Payer: Self-pay | Admitting: Oncology

## 2015-07-18 NOTE — Telephone Encounter (Signed)
Amber Santiago left a message asking if Amber Santiago's appointment on 08/04/15 at 8:40 could be moved closer to her 11:15 appointment with Dr. Julien Nordmann.  Notified [name], Medical Secretary who will try to move the appointment up and will call Amber Santiago back.

## 2015-07-25 ENCOUNTER — Encounter (INDEPENDENT_AMBULATORY_CARE_PROVIDER_SITE_OTHER): Payer: Managed Care, Other (non HMO) | Admitting: Ophthalmology

## 2015-07-25 ENCOUNTER — Telehealth: Payer: Self-pay | Admitting: *Deleted

## 2015-07-25 DIAGNOSIS — H2513 Age-related nuclear cataract, bilateral: Secondary | ICD-10-CM

## 2015-07-25 DIAGNOSIS — H353231 Exudative age-related macular degeneration, bilateral, with active choroidal neovascularization: Secondary | ICD-10-CM | POA: Diagnosis not present

## 2015-07-25 DIAGNOSIS — H43813 Vitreous degeneration, bilateral: Secondary | ICD-10-CM

## 2015-07-25 NOTE — Telephone Encounter (Signed)
Oncology Nurse Navigator Documentation  Oncology Nurse Navigator Flowsheets 07/25/2015  Navigator Encounter Type Telephone  Telephone Outgoing Call  Treatment Phase Post-Tx Follow-up  Barriers/Navigation Needs Coordination of Care  Interventions Coordination of Care  Coordination of Care Appts  Acuity Level 2  Acuity Level 2 Assistance expediting appointments  Time Spent with Patient 30   Patient's husband would like patient's appt time to be changed for her lab appt.  I spoke with lab and they were able to accommodate.  I called patient's husband back and updated him.  He was thankful for the help.

## 2015-07-29 ENCOUNTER — Ambulatory Visit (HOSPITAL_COMMUNITY)
Admission: RE | Admit: 2015-07-29 | Discharge: 2015-07-29 | Disposition: A | Discharge status: Home / Self Care | Payer: Managed Care, Other (non HMO) | Source: Ambulatory Visit | Attending: Internal Medicine | Admitting: Internal Medicine

## 2015-07-29 ENCOUNTER — Other Ambulatory Visit: Payer: Managed Care, Other (non HMO)

## 2015-07-29 ENCOUNTER — Other Ambulatory Visit (HOSPITAL_BASED_OUTPATIENT_CLINIC_OR_DEPARTMENT_OTHER): Payer: Managed Care, Other (non HMO)

## 2015-07-29 DIAGNOSIS — J439 Emphysema, unspecified: Secondary | ICD-10-CM | POA: Diagnosis not present

## 2015-07-29 DIAGNOSIS — R918 Other nonspecific abnormal finding of lung field: Secondary | ICD-10-CM | POA: Insufficient documentation

## 2015-07-29 DIAGNOSIS — C3411 Malignant neoplasm of upper lobe, right bronchus or lung: Secondary | ICD-10-CM | POA: Diagnosis not present

## 2015-07-29 DIAGNOSIS — C3491 Malignant neoplasm of unspecified part of right bronchus or lung: Secondary | ICD-10-CM | POA: Diagnosis present

## 2015-07-29 DIAGNOSIS — R590 Localized enlarged lymph nodes: Secondary | ICD-10-CM | POA: Insufficient documentation

## 2015-07-29 LAB — COMPREHENSIVE METABOLIC PANEL
ALT: 11 U/L (ref 0–55)
ANION GAP: 10 meq/L (ref 3–11)
AST: 29 U/L (ref 5–34)
Albumin: 3.4 g/dL — ABNORMAL LOW (ref 3.5–5.0)
Alkaline Phosphatase: 114 U/L (ref 40–150)
BILIRUBIN TOTAL: 0.44 mg/dL (ref 0.20–1.20)
BUN: 7.7 mg/dL (ref 7.0–26.0)
CALCIUM: 9.7 mg/dL (ref 8.4–10.4)
CO2: 27 meq/L (ref 22–29)
CREATININE: 0.7 mg/dL (ref 0.6–1.1)
Chloride: 99 mEq/L (ref 98–109)
EGFR: 88 mL/min/{1.73_m2} — AB (ref >90)
Glucose: 109 mg/dl (ref 70–140)
Potassium: 4 mEq/L (ref 3.5–5.1)
Sodium: 137 mEq/L (ref 136–145)
TOTAL PROTEIN: 7.3 g/dL (ref 6.4–8.3)

## 2015-07-29 LAB — CBC WITH DIFFERENTIAL/PLATELET
BASO%: 1.1 % (ref 0.0–2.0)
Basophils Absolute: 0.1 10*3/uL (ref 0.0–0.1)
EOS ABS: 0.1 10*3/uL (ref 0.0–0.5)
EOS%: 1.2 % (ref 0.0–7.0)
HEMATOCRIT: 42.3 % (ref 34.8–46.6)
HGB: 14 g/dL (ref 11.6–15.9)
LYMPH#: 0.7 10*3/uL — AB (ref 0.9–3.3)
LYMPH%: 10.5 % — AB (ref 14.0–49.7)
MCH: 29.3 pg (ref 25.1–34.0)
MCHC: 33.2 g/dL (ref 31.5–36.0)
MCV: 88.5 fL (ref 79.5–101.0)
MONO#: 0.3 10*3/uL (ref 0.1–0.9)
MONO%: 5.3 % (ref 0.0–14.0)
NEUT%: 81.9 % — AB (ref 38.4–76.8)
NEUTROS ABS: 5.2 10*3/uL (ref 1.5–6.5)
PLATELETS: 301 10*3/uL (ref 145–400)
RBC: 4.78 10*6/uL (ref 3.70–5.45)
RDW: 14.5 % (ref 11.2–14.5)
WBC: 6.4 10*3/uL (ref 3.9–10.3)

## 2015-07-29 MED ORDER — IOPAMIDOL (ISOVUE-300) INJECTION 61%
100.0000 mL | Freq: Once | INTRAVENOUS | Status: AC | PRN
  Administered 2015-07-29: 75 mL via INTRAVENOUS

## 2015-08-01 ENCOUNTER — Encounter: Payer: Self-pay | Admitting: Oncology

## 2015-08-04 ENCOUNTER — Ambulatory Visit
Admission: RE | Admit: 2015-08-04 | Discharge: 2015-08-04 | Disposition: A | Discharge status: Home / Self Care | Payer: Managed Care, Other (non HMO) | Source: Ambulatory Visit | Attending: Radiation Oncology | Admitting: Radiation Oncology

## 2015-08-04 ENCOUNTER — Ambulatory Visit (HOSPITAL_BASED_OUTPATIENT_CLINIC_OR_DEPARTMENT_OTHER): Payer: Managed Care, Other (non HMO) | Admitting: Internal Medicine

## 2015-08-04 ENCOUNTER — Telehealth: Payer: Self-pay | Admitting: Internal Medicine

## 2015-08-04 ENCOUNTER — Encounter: Payer: Self-pay | Admitting: Internal Medicine

## 2015-08-04 VITALS — BP 155/72 | HR 95 | Temp 97.8°F | Resp 18 | Ht 64.0 in | Wt 124.6 lb

## 2015-08-04 VITALS — BP 144/93 | HR 95 | Temp 97.9°F | Resp 17 | Ht 64.0 in | Wt 124.4 lb

## 2015-08-04 DIAGNOSIS — C3491 Malignant neoplasm of unspecified part of right bronchus or lung: Secondary | ICD-10-CM

## 2015-08-04 DIAGNOSIS — C3411 Malignant neoplasm of upper lobe, right bronchus or lung: Secondary | ICD-10-CM | POA: Diagnosis not present

## 2015-08-04 NOTE — Progress Notes (Signed)
Radiation Oncology         [phone] ________________________________  Name: Amber Santiago MRN: [id number]  Date: 08/04/2015  DOB: 1946/02/08  Follow-Up Visit Note  CC: Pcp Not In System  Curt Bears, MD   Diagnosis: Stage IIB (T3, N0, M0), squamous cell carcinoma the right lung   Interval Since Last Radiation: 6 1/2 weeks  05/05/2015-06/17/2015: Right central chest area 64 gray in 32 fractions  Narrative:  The patient returns today for routine follow-up. Chest X-ray on 07/04/15 noted no pleural effusion or pneumothroax. It also noted COPD changes, improved right upper lobe infiltrate, and diffuse osseous demineralization. The patient saw Dr. Lamonte Sakai on 07/05/15 who defered treatment for possible radiation pneumonitis and recommended the patient should be followed clinically. CT of the chest with contrast on 07/29/15 revealed that the right upper lobe mass is stable (measuring 2.5 x 3.2 cm), stable right hilar adenopathy, emphsyma, biapical scarring, and a chronic rind of hypodensity along the upper spleen that is likely chronic subcapsular fluid collection.  ROS: She reports occasional pain under her ribs that she has had for years (around 20 years). She reports an intermittent dry cough that is worse in the evening. She is still fatigued, but states it is getting better. She also reports her appetite is poor and gets full fast. She reports always having not much of an appetite. She has lost around 10 lbs since she was last here. She has stopped eating bread since she gets tired from chewing it. She has no difficulty swallowing. She experiences nausea. She denies hemoptysis.  ALLERGIES:  is allergic to amoxicillin and penicillins.  Meds: Current Outpatient Prescriptions  Medication Sig Dispense Refill  . Ascorbic Acid (VITAMIN C) 1000 MG tablet Take 1,000 mg by mouth daily. Reported on 07/04/2015    . BESIVANCE 0.6 % SUSP     . bevacizumab (AVASTIN) 1.25 mg/0.1 mL SOLN 1.25 mg by  Intravitreal route to Surgery. Reported on 05/24/2015    . brimonidine (ALPHAGAN) 0.15 % ophthalmic solution     . budesonide-formoterol (SYMBICORT) 160-4.5 MCG/ACT inhaler INHALE TWO PUFFS INTO LUNGS TWICE DAILY 11 g 11  . fluorometholone (FML) 0.1 % ophthalmic suspension     . GARLIC PO Take 1 capsule by mouth daily. Reported on 07/05/2015    . Multiple Vitamins-Minerals (PRESERVISION AREDS 2 PO) Take 1 capsule by mouth daily. Reported on 07/05/2015    . Stinging Nettle 2 % POWD by Does not apply route daily. Reported on 07/04/2015    . TURMERIC CURCUMIN PO Take 1 capsule by mouth daily. Reported on 07/05/2015    . Acetaminophen (TYLENOL) 325 MG CAPS Take 500 mg by mouth every 6 (six) hours as needed. Reported on 08/04/2015    . cholecalciferol (VITAMIN D) 1000 UNITS tablet Take 2,000 Units by mouth daily. Reported on 08/04/2015    . Cyanocobalamin (B-12 PO) Take 1 tablet by mouth daily. Reported on 08/04/2015    . furosemide (LASIX) 20 MG tablet Take 1 tablet (20 mg total) by mouth daily. (Patient not taking: Reported on 08/04/2015) 3 tablet 1  . HYDROcodone-acetaminophen (NORCO) 5-325 MG tablet Take 1 tablet by mouth every 4 (four) hours as needed for moderate pain. (Patient not taking: Reported on 08/04/2015) 30 tablet 0  . LORazepam (ATIVAN) 0.5 MG tablet Take 1 tablet (0.5 mg total) by mouth 2 (two) times daily. (Patient not taking: Reported on 08/04/2015) 10 tablet 0  . predniSONE (DELTASONE) 10 MG tablet Take '40mg'$  daily for 3 days, then  $'30mg'X$  daily for 3 days, then '20mg'$  daily for 3 days, then '10mg'$  daily for 3 days, then stop (Patient not taking: Reported on 08/04/2015) 30 tablet 0  . Pyridoxine HCl (B-6 PO) Take 1 tablet by mouth daily. Reported on 08/04/2015    . Spacer/Aero-Holding Chambers (AEROCHAMBER MV) inhaler Use as instructed (Patient not taking: Reported on 08/04/2015) 1 each 0  . Wound Dressings (SONAFINE EX) Apply topically. Reported on 08/04/2015     No current facility-administered  medications for this encounter.    Physical Findings: The patient is in no acute distress. Patient is alert and oriented.  height is '5\' 4"'$  (1.626 m) and weight is 124 lb 9.6 oz (56.518 kg). Her oral temperature is 97.8 F (36.6 C). Her blood pressure is 155/72 and her pulse is 95. Her respiration is 18 and oxygen saturation is 95%.   Lungs are clear to auscultation bilaterally. Heart has regular rate and rhythm. No palpable cervical, supraclavicular, or axillary adenopathy. Abdomen soft, non-tender, normal bowel sounds.  Lab Findings: Lab Results  Component Value Date   WBC 6.4 07/29/2015   HGB 14.0 07/29/2015   HCT 42.3 07/29/2015   MCV 88.5 07/29/2015   PLT 301 07/29/2015    Radiographic Findings: Ct Chest W Contrast  07/29/2015  CLINICAL DATA:  Right lung cancer diagnosed November 2016. Chronic cough. Radiation therapy completed. EXAM: CT CHEST WITH CONTRAST TECHNIQUE: Multidetector CT imaging of the chest was performed during intravenous contrast administration. CONTRAST:  25m ISOVUE-300 IOPAMIDOL (ISOVUE-300) INJECTION 61% COMPARISON:  Multiple exams, including 07/04/2015 and 02/24/2015 FINDINGS: Mediastinum/Nodes: Right hilar node 1.8 cm in short axis image 38 series 2, previously difficult to measure due to the lack of iv the contrast but probably about 1.7 cm on 02/15/2015. Lungs/Pleura: Spiculated right upper lobe mass, 2.5 by 3.2 cm on image 43/5, by my measurements previously 2.4 by 3.3 cm, and hence roughly stable. Small cavitary component, image 39/5. There is some occlusion of subsegmental bronchi along the mass. Biapical pleural parenchymal scarring. Paraseptal emphysema. Peripheral nodularity more cephalad in the right upper lobe may be related to the scarring. 5 by 3 mm nodule along the minor fissure, image 66/5, stable. Upper abdomen: There is abnormal hypodensity along the upper margin of the spleen, concerning for some type of subcapsular splenic lesion given the tight  association with the spleen. Perisplenic ascites is a less likely differential diagnostic consideration. My sense is that this has been present previously but is more conspicuous today due to the presence of IV contrast. This was not previously hypermetabolic. Musculoskeletal: 2 unremarkable IMPRESSION: 1. Right upper lobe mass is stable, by my measurements today 2.5 by 3.2 cm. Stable right hilar adenopathy, 1.8 cm in short axis. 2. Emphysema. 3. Biapical scarring. 4. Chronic rind of hypodensity along the upper spleen, likely a chronic subcapsular fluid collection. This is been difficult to observe on recent prior CTs due to lack of IV contrast but my sense is was likely previously present. Not previously hypermetabolic on PET-CT. Electronically Signed   By: WVan ClinesM.D.   On: 07/29/2015 14:10    Impression: Stage IIB (T3, N0, M0) squamous cell carcinoma the lung; status post radiation therapy. Unfortunately, she had very little response to her high dose radiation therapy. The patient and her family were obviously upset at this lack of response.  The patient is against the prospect of chemotherapy. At the time of  biopsy, it was attempted to collect enough to discern PDL 1 expression, but there  was not enough tissue to be sent off. It would be worthwhile to consider another biopsy for this test and conduct targeted therapy if there is PDL 1 expression. Another option that the patient could undergo is a PET scan 6 weeks from now to determine if the right upper lobe nodule is still active and determine further treatment options then since she is hesitant to consider chemotherapy.  It is too soon after her ration therapy to perform a PET scan.  Plan: The patient will  follow up with Dr. Julien Nordmann later today to discuss further systemic treatment options and possible re-biopsy of the right upper lobe lung nodule. She is also scheduled to follow up with Dr. Lamonte Sakai next week.    ____________________________________ This document serves as a record of services personally performed by Gery Pray, MD. It was created on his behalf by Darcus Austin, a trained medical scribe. The creation of this record is based on the scribe's personal observations and the provider's statements to them. This document has been checked and approved by the attending provider.

## 2015-08-04 NOTE — Progress Notes (Signed)
[facility] Telephone:([phone]   Fax:(336) [phone]  OFFICE PROGRESS NOTE  Pcp Not In System No address on file  DIAGNOSIS: Stage IIB (T3, N0, M0) non-small cell lung cancer, squamous cell carcinoma diagnosed in December 2016 and presenting with large right upper lobe lung mass in addition to a satellite nodule.  PRIOR THERAPY: Definitive radiotherapy under the care of Dr. Sondra Come for a total dose of 64 gray completed 06/17/2015.  CURRENT THERAPY: Observation  INTERVAL HISTORY: Amber Santiago 70 y.o. female returns to the clinic today for visit accompanied by her husband, daughter and granddaughter. She completed a course of curative radiotherapy under the care of Dr. Sondra Come and tolerating her treatment fairly well. She is feeling fine today with no specific complaints. She denied having any significant chest pain, shortness of breath, cough or hemoptysis. She has no fever or chills. She denied having any significant night sweats but lost few pounds recently. She has no headache or visual changes. She had repeat CT scan of the chest performed recently and she is here for evaluation and discussion of her scan results and further treatment options.  MEDICAL HISTORY: Past Medical History  Diagnosis Date  . COPD (chronic obstructive pulmonary disease) ([place])   . Asthma   . Hypertension   . Lung nodule   . Pneumonia   . Hyperthyroidism     PMH  . Umbilical hernia   . Arthritis   . Cancer ([place])     right lung  . Constipation   . 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 Radiation 05/05/15-06/17/15    right central chest area 64 gray    ALLERGIES:  is allergic to amoxicillin and penicillins.  MEDICATIONS:  Current Outpatient Prescriptions  Medication Sig Dispense Refill  . Acetaminophen (TYLENOL) 325 MG CAPS Take 500 mg by mouth every 6 (six) hours as needed. Reported on 08/04/2015    . BESIVANCE 0.6 % SUSP     . bevacizumab (AVASTIN) 1.25 mg/0.1  mL SOLN 1.25 mg by Intravitreal route to Surgery. Reported on 05/24/2015    . brimonidine (ALPHAGAN) 0.15 % ophthalmic solution     . budesonide-formoterol (SYMBICORT) 160-4.5 MCG/ACT inhaler INHALE TWO PUFFS INTO LUNGS TWICE DAILY 11 g 11  . cholecalciferol (VITAMIN D) 1000 UNITS tablet Take 2,000 Units by mouth daily. Reported on 08/04/2015    . Cyanocobalamin (B-12 PO) Take 1 tablet by mouth daily. Reported on 08/04/2015    . fluorometholone (FML) 0.1 % ophthalmic suspension     . GARLIC PO Take 1 capsule by mouth daily. Reported on 07/05/2015    . Multiple Vitamins-Minerals (PRESERVISION AREDS 2 PO) Take 1 capsule by mouth daily. Reported on 07/05/2015    . predniSONE (DELTASONE) 10 MG tablet Take '40mg'$  daily for 3 days, then '30mg'$  daily for 3 days, then '20mg'$  daily for 3 days, then '10mg'$  daily for 3 days, then stop 30 tablet 0  . Pyridoxine HCl (B-6 PO) Take 1 tablet by mouth daily. Reported on 08/04/2015    . Stinging Nettle 2 % POWD by Does not apply route daily. Reported on 07/04/2015    . TURMERIC CURCUMIN PO Take 1 capsule by mouth daily. Reported on 07/05/2015    . Wound Dressings (SONAFINE EX) Apply topically. Reported on 08/04/2015    . Ascorbic Acid (VITAMIN C) 1000 MG tablet Take 1,000 mg by mouth daily. Reported on 08/04/2015    . furosemide (LASIX) 20 MG tablet Take 1 tablet (20 mg  total) by mouth daily. (Patient not taking: Reported on 08/04/2015) 3 tablet 1  . HYDROcodone-acetaminophen (NORCO) 5-325 MG tablet Take 1 tablet by mouth every 4 (four) hours as needed for moderate pain. (Patient not taking: Reported on 08/04/2015) 30 tablet 0  . LORazepam (ATIVAN) 0.5 MG tablet Take 1 tablet (0.5 mg total) by mouth 2 (two) times daily. (Patient not taking: Reported on 08/04/2015) 10 tablet 0  . Spacer/Aero-Holding Chambers (AEROCHAMBER MV) inhaler Use as instructed (Patient not taking: Reported on 08/04/2015) 1 each 0   No current facility-administered medications for this visit.    SURGICAL  HISTORY:  Past Surgical History  Procedure Laterality Date  . Video bronchoscopy Bilateral 03/18/2015    Procedure: VIDEO BRONCHOSCOPY WITH FLUORO;  Surgeon: Collene Gobble, MD;  Location: [facility];  Service: Cardiopulmonary;  Laterality: Bilateral;  . Appendectomy    . Dilation and curettage of uterus    . Video bronchoscopy with endobronchial ultrasound N/A 04/12/2015    Procedure: VIDEO BRONCHOSCOPY WITH ENDOBRONCHIAL ULTRASOUND;  Surgeon: Grace Isaac, MD;  Location: [facility];  Service: Thoracic;  Laterality: N/A;  . Mediastinoscopy N/A 04/12/2015    Procedure: MEDIASTINOSCOPY;  Surgeon: Grace Isaac, MD;  Location: [facility];  Service: Thoracic;  Laterality: N/A;    REVIEW OF SYSTEMS:  Constitutional: positive for weight loss Eyes: negative Ears, nose, mouth, throat, and face: negative Respiratory: negative Cardiovascular: negative Gastrointestinal: negative Genitourinary:negative Integument/breast: negative Hematologic/lymphatic: negative Musculoskeletal:negative Neurological: negative Behavioral/Psych: negative Endocrine: negative Allergic/Immunologic: negative   PHYSICAL EXAMINATION: General appearance: alert, cooperative, fatigued and no distress Head: Normocephalic, without obvious abnormality, atraumatic Neck: no adenopathy, no JVD, supple, symmetrical, trachea midline and thyroid not enlarged, symmetric, no tenderness/mass/nodules Lymph nodes: Cervical, supraclavicular, and axillary nodes normal. Resp: wheezes bilaterally Back: symmetric, no curvature. ROM normal. No CVA tenderness. Cardio: regular rate and rhythm, S1, S2 normal, no murmur, click, rub or gallop GI: soft, non-tender; bowel sounds normal; no masses,  no organomegaly Extremities: extremities normal, atraumatic, no cyanosis or edema Neurologic: Alert and oriented X 3, normal strength and tone. Normal symmetric reflexes. Normal coordination and gait  ECOG PERFORMANCE STATUS: 1 - Symptomatic but  completely ambulatory  Blood pressure 144/93, pulse 95, temperature 97.9 F (36.6 C), temperature source Oral, resp. rate 17, height '5\' 4"'$  (1.626 m), weight 124 lb 6.4 oz (56.427 kg), SpO2 96 %.  LABORATORY DATA: Lab Results  Component Value Date   WBC 6.4 07/29/2015   HGB 14.0 07/29/2015   HCT 42.3 07/29/2015   MCV 88.5 07/29/2015   PLT 301 07/29/2015      Chemistry      Component Value Date/Time   NA 137 07/29/2015 1142   NA 137 04/06/2015 1242   K 4.0 07/29/2015 1142   K 4.3 04/06/2015 1242   CL 99* 04/06/2015 1242   CO2 27 07/29/2015 1142   CO2 29 04/06/2015 1242   BUN 7.7 07/29/2015 1142   BUN 6 04/06/2015 1242   CREATININE 0.7 07/29/2015 1142   CREATININE 0.61 04/06/2015 1242      Component Value Date/Time   CALCIUM 9.7 07/29/2015 1142   CALCIUM 9.3 04/06/2015 1242   ALKPHOS 114 07/29/2015 1142   ALKPHOS 78 04/06/2015 1242   AST 29 07/29/2015 1142   AST 33 04/06/2015 1242   ALT 11 07/29/2015 1142   ALT 17 04/06/2015 1242   BILITOT 0.44 07/29/2015 1142   BILITOT 0.6 04/06/2015 1242       RADIOGRAPHIC STUDIES: Ct Chest W Contrast  07/29/2015  CLINICAL DATA:  Right lung cancer diagnosed November 2016. Chronic cough. Radiation therapy completed. EXAM: CT CHEST WITH CONTRAST TECHNIQUE: Multidetector CT imaging of the chest was performed during intravenous contrast administration. CONTRAST:  102m ISOVUE-300 IOPAMIDOL (ISOVUE-300) INJECTION 61% COMPARISON:  Multiple exams, including 07/04/2015 and 02/24/2015 FINDINGS: Mediastinum/Nodes: Right hilar node 1.8 cm in short axis image 38 series 2, previously difficult to measure due to the lack of iv the contrast but probably about 1.7 cm on 02/15/2015. Lungs/Pleura: Spiculated right upper lobe mass, 2.5 by 3.2 cm on image 43/5, by my measurements previously 2.4 by 3.3 cm, and hence roughly stable. Small cavitary component, image 39/5. There is some occlusion of subsegmental bronchi along the mass. Biapical pleural  parenchymal scarring. Paraseptal emphysema. Peripheral nodularity more cephalad in the right upper lobe may be related to the scarring. 5 by 3 mm nodule along the minor fissure, image 66/5, stable. Upper abdomen: There is abnormal hypodensity along the upper margin of the spleen, concerning for some type of subcapsular splenic lesion given the tight association with the spleen. Perisplenic ascites is a less likely differential diagnostic consideration. My sense is that this has been present previously but is more conspicuous today due to the presence of IV contrast. This was not previously hypermetabolic. Musculoskeletal: 2 unremarkable IMPRESSION: 1. Right upper lobe mass is stable, by my measurements today 2.5 by 3.2 cm. Stable right hilar adenopathy, 1.8 cm in short axis. 2. Emphysema. 3. Biapical scarring. 4. Chronic rind of hypodensity along the upper spleen, likely a chronic subcapsular fluid collection. This is been difficult to observe on recent prior CTs due to lack of IV contrast but my sense is was likely previously present. Not previously hypermetabolic on PET-CT. Electronically Signed   By: WVan ClinesM.D.   On: 07/29/2015 14:10    ASSESSMENT AND PLAN: This is a very pleasant 70years old white female recently diagnosed with a stage IIb non-small cell lung cancer, squamous cell carcinoma presented with large right upper lobe lung mass as well as satellite nodule. Evaluation of the mediastinal lymph nodes with repeat bronchoscopy, endobronchial ultrasound and biopsies as well as mediastinoscopy showed no evidence of metastatic disease to the mediastinal or hilar lymph nodes. The patient completed a course of curative radiotherapy under the care of Dr. chimeric and tolerating it well.  The recent CT scan of the chest showed mild improvement in the right upper lobe lung mass with decrease in the tumor size from 4.0 x 3.0 CM to 3.2 x 2.5 CM. The patient also had a stable right hilar  adenopathy. I discussed the scan results and showed the images to the patient and her family. The patient was a little bit upset that she did not have better response to the curative radiotherapy. I explained to the patient that in the absence of disease progression, this is a stellate good results. I discussed with the patient the option of adjuvant systemic chemotherapy with carboplatin and paclitaxel for 4 cycles versus continuous observation and close monitoring. The patient is not interested in proceeding with chemotherapy at this point. I will see her back for follow-up visit in 3 months with repeat PET scan for further evaluation of her disease. The patient was advised to call immediately if she has any concerning symptoms in the interval. The patient voices understanding of current disease status and treatment options and is in agreement with the current care plan.  All questions were answered. The patient knows to call the  clinic with any problems, questions or concerns. We can certainly see the patient much sooner if necessary.  Disclaimer: This note was dictated with voice recognition software. Similar sounding words can inadvertently be transcribed and may not be corrected upon review.

## 2015-08-04 NOTE — Progress Notes (Signed)
Amber Santiago here for follow up.  She reports having occasional pain under her ribs that she has had for years.  She reports having an intermittent dry cough that is worse in the evening.  She reports she is still fatigued but states it is getting better.  She also reports her appetite is poor and gets full fast.  She has lost at least 10 lb weight loss since she was here last.  She has an appointment with Dr. Julien Nordmann today.  BP 155/72 mmHg  Pulse 95  Temp(Src) 97.8 F (36.6 C) (Oral)  Resp 18  Ht '5\' 4"'$  (1.626 m)  Wt 124 lb 9.6 oz (56.518 kg)  BMI 21.38 kg/m2  SpO2 95%   Wt Readings from Last 3 Encounters:  08/04/15 124 lb 9.6 oz (56.518 kg)  07/05/15 138 lb (62.596 kg)  07/04/15 130 lb 11.2 oz (59.285 kg)

## 2015-08-04 NOTE — Patient Instructions (Signed)
Smoking Cessation, Tips for Success If you are ready to quit smoking, congratulations! You have chosen to help yourself be healthier. Cigarettes bring nicotine, tar, carbon monoxide, and other irritants into your body. Your lungs, heart, and blood vessels will be able to work better without these poisons. There are many different ways to quit smoking. Nicotine gum, nicotine patches, a nicotine inhaler, or nicotine nasal spray can help with physical craving. Hypnosis, support groups, and medicines help break the habit of smoking. WHAT THINGS CAN I DO TO MAKE QUITTING EASIER?  Here are some tips to help you quit for good:  Pick a date when you will quit smoking completely. Tell all of your friends and family about your plan to quit on that date.  Do not try to slowly cut down on the number of cigarettes you are smoking. Pick a quit date and quit smoking completely starting on that day.  Throw away all cigarettes.   Clean and remove all ashtrays from your home, work, and car.  On a card, write down your reasons for quitting. Carry the card with you and read it when you get the urge to smoke.  Cleanse your body of nicotine. Drink enough water and fluids to keep your urine clear or pale yellow. Do this after quitting to flush the nicotine from your body.  Learn to predict your moods. Do not let a bad situation be your excuse to have a cigarette. Some situations in your life might tempt you into wanting a cigarette.  Never have "just one" cigarette. It leads to wanting another and another. Remind yourself of your decision to quit.  Change habits associated with smoking. If you smoked while driving or when feeling stressed, try other activities to replace smoking. Stand up when drinking your coffee. Brush your teeth after eating. Sit in a different chair when you read the paper. Avoid alcohol while trying to quit, and try to drink fewer caffeinated beverages. Alcohol and caffeine may urge you to  smoke.  Avoid foods and drinks that can trigger a desire to smoke, such as sugary or spicy foods and alcohol.  Ask people who smoke not to smoke around you.  Have something planned to do right after eating or having a cup of coffee. For example, plan to take a walk or exercise.  Try a relaxation exercise to calm you down and decrease your stress. Remember, you may be tense and nervous for the first 2 weeks after you quit, but this will pass.  Find new activities to keep your hands busy. Play with a pen, coin, or rubber band. Doodle or draw things on paper.  Brush your teeth right after eating. This will help cut down on the craving for the taste of tobacco after meals. You can also try mouthwash.   Use oral substitutes in place of cigarettes. Try using lemon drops, carrots, cinnamon sticks, or chewing gum. Keep them handy so they are available when you have the urge to smoke.  When you have the urge to smoke, try deep breathing.  Designate your home as a nonsmoking area.  If you are a heavy smoker, ask your health care provider about a prescription for nicotine chewing gum. It can ease your withdrawal from nicotine.  Reward yourself. Set aside the cigarette money you save and buy yourself something nice.  Look for support from others. Join a support group or smoking cessation program. Ask someone at home or at work to help you with your plan   to quit smoking.  Always ask yourself, "Do I need this cigarette or is this just a reflex?" Tell yourself, "Today, I choose not to smoke," or "I do not want to smoke." You are reminding yourself of your decision to quit.  Do not replace cigarette smoking with electronic cigarettes (commonly called e-cigarettes). The safety of e-cigarettes is unknown, and some may contain harmful chemicals.  If you relapse, do not give up! Plan ahead and think about what you will do the next time you get the urge to smoke. HOW WILL I FEEL WHEN I QUIT SMOKING? You  may have symptoms of withdrawal because your body is used to nicotine (the addictive substance in cigarettes). You may crave cigarettes, be irritable, feel very hungry, cough often, get headaches, or have difficulty concentrating. The withdrawal symptoms are only temporary. They are strongest when you first quit but will go away within 10-14 days. When withdrawal symptoms occur, stay in control. Think about your reasons for quitting. Remind yourself that these are signs that your body is healing and getting used to being without cigarettes. Remember that withdrawal symptoms are easier to treat than the major diseases that smoking can cause.  Even after the withdrawal is over, expect periodic urges to smoke. However, these cravings are generally short lived and will go away whether you smoke or not. Do not smoke! WHAT RESOURCES ARE AVAILABLE TO HELP ME QUIT SMOKING? Your health care provider can direct you to community resources or hospitals for support, which may include:  Group support.  Education.  Hypnosis.  Therapy.   This information is not intended to replace advice given to you by your health care provider. Make sure you discuss any questions you have with your health care provider.   Document Released: 12/23/2003 Document Revised: 04/16/2014 Document Reviewed: 09/11/2012 Elsevier Interactive Patient Education 2016 [employer]  

## 2015-08-04 NOTE — Telephone Encounter (Signed)
Gave pt appt & avs °

## 2015-08-11 ENCOUNTER — Telehealth: Payer: Self-pay | Admitting: Oncology

## 2015-08-11 ENCOUNTER — Ambulatory Visit: Payer: Managed Care, Other (non HMO) | Admitting: Emergency Medicine

## 2015-08-11 ENCOUNTER — Other Ambulatory Visit: Payer: Self-pay | Admitting: Radiation Oncology

## 2015-08-11 DIAGNOSIS — C3491 Malignant neoplasm of unspecified part of right bronchus or lung: Secondary | ICD-10-CM

## 2015-08-11 MED ORDER — HYDROCOD POLST-CPM POLST ER 10-8 MG/5ML PO SUER: 5.0000 mL | Freq: Two times a day (BID) | ORAL | Status: DC | PRN

## 2015-08-11 NOTE — Telephone Encounter (Signed)
[name] called and said Amber Santiago still has a dry cough.  He is wondering if there is anything that can be prescribed to help her cough.  He said he medication that Dr. Sondra Come prescribed before helped her sleep but did not help her cough.

## 2015-08-12 NOTE — Telephone Encounter (Deleted)
Called Amber Santiago back and advised him that Dr. Sondra Come has given Amber Santiago a precription for Tussionex.  Advised him it is available for pick up in the Radiation nursing area.  Amber Santiago verbalized agreement and understanding.

## 2015-08-22 ENCOUNTER — Encounter (INDEPENDENT_AMBULATORY_CARE_PROVIDER_SITE_OTHER): Payer: Managed Care, Other (non HMO) | Admitting: Ophthalmology

## 2015-08-22 ENCOUNTER — Telehealth: Payer: Self-pay | Admitting: Emergency Medicine

## 2015-08-22 DIAGNOSIS — H353231 Exudative age-related macular degeneration, bilateral, with active choroidal neovascularization: Secondary | ICD-10-CM | POA: Diagnosis not present

## 2015-08-22 DIAGNOSIS — H2513 Age-related nuclear cataract, bilateral: Secondary | ICD-10-CM

## 2015-08-22 DIAGNOSIS — H43813 Vitreous degeneration, bilateral: Secondary | ICD-10-CM | POA: Diagnosis not present

## 2015-08-22 MED ORDER — HYDROCODONE-HOMATROPINE 5-1.5 MG/5ML PO SYRP: 5.0000 mL | ORAL_SOLUTION | Freq: Four times a day (QID) | ORAL | Status: DC | PRN

## 2015-08-22 NOTE — Telephone Encounter (Signed)
Pt husband calling states the patient is coughing again, no production, no SOB. Requesting something for cough suppression to be called into [facility] Tussionex has not worked in the past.  Requesting abx be sent to pharmacy - last given abx and Pred taper 06/2015 by RB  Please advise Dr Lake Bells. Thanks.  Allergies  Allergen Reactions  . Amoxicillin Anaphylaxis  . Penicillins Anaphylaxis

## 2015-08-22 NOTE — Telephone Encounter (Signed)
Spoke with husband and gave recommendations. He would like to pick up rx tomorrow. Advised it will be at front desk. Nothing further needed.

## 2015-08-22 NOTE — Telephone Encounter (Signed)
Hycodan 5 mL q4-6h prn cough dispense 16m

## 2015-09-19 ENCOUNTER — Encounter (INDEPENDENT_AMBULATORY_CARE_PROVIDER_SITE_OTHER): Payer: Managed Care, Other (non HMO) | Admitting: Ophthalmology

## 2015-09-19 DIAGNOSIS — H353231 Exudative age-related macular degeneration, bilateral, with active choroidal neovascularization: Secondary | ICD-10-CM

## 2015-09-19 DIAGNOSIS — H43813 Vitreous degeneration, bilateral: Secondary | ICD-10-CM | POA: Diagnosis not present

## 2015-09-28 ENCOUNTER — Telehealth: Payer: Self-pay | Admitting: Medical Oncology

## 2015-09-28 NOTE — Telephone Encounter (Signed)
asking abut FMLA -transferred call to [facility]

## 2015-09-29 ENCOUNTER — Encounter: Payer: Self-pay | Admitting: Internal Medicine

## 2015-09-29 NOTE — Progress Notes (Signed)
folder left on my desk

## 2015-10-03 ENCOUNTER — Telehealth: Payer: Self-pay | Admitting: Emergency Medicine

## 2015-10-03 NOTE — Telephone Encounter (Signed)
Spoke with pt's husband. He states that pt is having issues with coughing. I offered an appointment with MW on Wednesday. He declined and states that pt only wants to see RB. Advised that [facility] does not have any available appointments this week. States that he will speak with pt and get back with [place] as to what they would like to do.

## 2015-10-04 ENCOUNTER — Encounter: Payer: Self-pay | Admitting: Internal Medicine

## 2015-10-04 NOTE — Progress Notes (Signed)
folder left on my desk- left for dr. Julien Nordmann to sign

## 2015-10-05 ENCOUNTER — Encounter: Payer: Self-pay | Admitting: Internal Medicine

## 2015-10-05 NOTE — Progress Notes (Signed)
folder left on my desk- left for dr. Julien Nordmann to sign- let nigel know ready for pck with ms wilma-copy to medical recrds

## 2015-10-17 ENCOUNTER — Encounter (INDEPENDENT_AMBULATORY_CARE_PROVIDER_SITE_OTHER): Payer: Managed Care, Other (non HMO) | Admitting: Ophthalmology

## 2015-10-17 DIAGNOSIS — H43813 Vitreous degeneration, bilateral: Secondary | ICD-10-CM | POA: Diagnosis not present

## 2015-10-17 DIAGNOSIS — H353231 Exudative age-related macular degeneration, bilateral, with active choroidal neovascularization: Secondary | ICD-10-CM | POA: Diagnosis not present

## 2015-10-25 ENCOUNTER — Telehealth: Payer: Self-pay | Admitting: *Deleted

## 2015-10-25 ENCOUNTER — Telehealth: Payer: Self-pay

## 2015-10-25 NOTE — Telephone Encounter (Signed)
Patients husband came into the cancer center yesterday complaining about the appointments made for pts pet scan.  I spoke with him to determine what the problem was and see how I could help him.  He stated that they thought the appointment had already been made but then someone called to give them an appt time and this upset pt greatly.  After speaking to him for a while it was decided that I would try to get the lab and pet scan appt on the same day to help ease her concerns.  Spoke with Audie Clear and she was able to reschedule the labs for the same day as the pet scan.  Current schedule is as follows: Lab 11am 7/26 (be here at 10:30) Pet Scan 12pm 7/26 - scan should take about 60 mins MM appt 8/10 11:15am   Also after speaking to Audie Clear I was advised to have patient recheck her eye appt she thinks is scheduled for 7/26.  According to what Lenna Sciara could see her appt was 8/9 and not 7/26.  So I would like to just remind patient to verify this appt.  Unable to reach patient or husband on both numbers listed, no way to leave a message

## 2015-10-25 NOTE — Telephone Encounter (Signed)
Returned call to pt husband unable to reach lmovm with information.  Called pt home # spoke with wife who was very upset that her scan was originally scheduled for Jul 20th and the labs, MD visit was for 7/27.  Pt husband came into hospital yesterday to find out about her appts as they dissapeared from [place] and she no longer had any appts. Discussed with pt the system currently shows her PET scan for 7/26, with labs prior. F/U with MD on 7/27 was cancelled as MD will be out of the office that day, r/s for 8/10. Pt again very upset this f/u was so far out. Discussed with pt I will see if this appt can be moved to a sooner date. POF to scheduling.

## 2015-10-27 ENCOUNTER — Other Ambulatory Visit: Payer: Managed Care, Other (non HMO)

## 2015-11-01 ENCOUNTER — Other Ambulatory Visit: Payer: Self-pay | Admitting: Internal Medicine

## 2015-11-01 ENCOUNTER — Other Ambulatory Visit: Payer: Self-pay | Admitting: *Deleted

## 2015-11-02 ENCOUNTER — Emergency Department (HOSPITAL_COMMUNITY): Payer: Managed Care, Other (non HMO)

## 2015-11-02 ENCOUNTER — Ambulatory Visit (HOSPITAL_COMMUNITY)
Admission: RE | Admit: 2015-11-02 | Discharge: 2015-11-02 | Disposition: A | Discharge status: Home / Self Care | Payer: Managed Care, Other (non HMO) | Source: Ambulatory Visit | Attending: Internal Medicine | Admitting: Internal Medicine

## 2015-11-02 ENCOUNTER — Other Ambulatory Visit (HOSPITAL_BASED_OUTPATIENT_CLINIC_OR_DEPARTMENT_OTHER): Payer: Managed Care, Other (non HMO)

## 2015-11-02 ENCOUNTER — Other Ambulatory Visit: Payer: Self-pay

## 2015-11-02 ENCOUNTER — Emergency Department (HOSPITAL_COMMUNITY)
Admission: EM | Admit: 2015-11-02 | Discharge: 2015-11-02 | Disposition: A | Discharge status: Home / Self Care | Payer: Managed Care, Other (non HMO) | Attending: Emergency Medicine | Admitting: Emergency Medicine

## 2015-11-02 ENCOUNTER — Encounter (HOSPITAL_COMMUNITY): Payer: Self-pay

## 2015-11-02 ENCOUNTER — Encounter (HOSPITAL_COMMUNITY): Payer: Self-pay | Admitting: Emergency Medicine

## 2015-11-02 DIAGNOSIS — J45909 Unspecified asthma, uncomplicated: Secondary | ICD-10-CM | POA: Diagnosis not present

## 2015-11-02 DIAGNOSIS — C3491 Malignant neoplasm of unspecified part of right bronchus or lung: Secondary | ICD-10-CM

## 2015-11-02 DIAGNOSIS — J449 Chronic obstructive pulmonary disease, unspecified: Secondary | ICD-10-CM | POA: Insufficient documentation

## 2015-11-02 DIAGNOSIS — F17210 Nicotine dependence, cigarettes, uncomplicated: Secondary | ICD-10-CM | POA: Diagnosis not present

## 2015-11-02 DIAGNOSIS — C3411 Malignant neoplasm of upper lobe, right bronchus or lung: Secondary | ICD-10-CM

## 2015-11-02 DIAGNOSIS — I10 Essential (primary) hypertension: Secondary | ICD-10-CM | POA: Insufficient documentation

## 2015-11-02 DIAGNOSIS — Z85118 Personal history of other malignant neoplasm of bronchus and lung: Secondary | ICD-10-CM | POA: Diagnosis not present

## 2015-11-02 DIAGNOSIS — R079 Chest pain, unspecified: Secondary | ICD-10-CM | POA: Insufficient documentation

## 2015-11-02 DIAGNOSIS — Z79899 Other long term (current) drug therapy: Secondary | ICD-10-CM | POA: Insufficient documentation

## 2015-11-02 DIAGNOSIS — M549 Dorsalgia, unspecified: Secondary | ICD-10-CM | POA: Diagnosis not present

## 2015-11-02 DIAGNOSIS — Z7951 Long term (current) use of inhaled steroids: Secondary | ICD-10-CM | POA: Insufficient documentation

## 2015-11-02 DIAGNOSIS — E0590 Thyrotoxicosis, unspecified without thyrotoxic crisis or storm: Secondary | ICD-10-CM | POA: Insufficient documentation

## 2015-11-02 DIAGNOSIS — M1990 Unspecified osteoarthritis, unspecified site: Secondary | ICD-10-CM | POA: Diagnosis not present

## 2015-11-02 LAB — CBC WITH DIFFERENTIAL/PLATELET
BASO%: 1 % (ref 0.0–2.0)
BASOS ABS: 0.1 10*3/uL (ref 0.0–0.1)
EOS ABS: 0.1 10*3/uL (ref 0.0–0.5)
EOS%: 1.3 % (ref 0.0–7.0)
HEMATOCRIT: 41.3 % (ref 34.8–46.6)
HEMOGLOBIN: 13.7 g/dL (ref 11.6–15.9)
LYMPH#: 0.6 10*3/uL — AB (ref 0.9–3.3)
LYMPH%: 11.7 % — ABNORMAL LOW (ref 14.0–49.7)
MCH: 29.3 pg (ref 25.1–34.0)
MCHC: 33.1 g/dL (ref 31.5–36.0)
MCV: 88.7 fL (ref 79.5–101.0)
MONO#: 0.4 10*3/uL (ref 0.1–0.9)
MONO%: 6.7 % (ref 0.0–14.0)
NEUT%: 79.3 % — ABNORMAL HIGH (ref 38.4–76.8)
NEUTROS ABS: 4.4 10*3/uL (ref 1.5–6.5)
PLATELETS: 209 10*3/uL (ref 145–400)
RBC: 4.65 10*6/uL (ref 3.70–5.45)
RDW: 14.3 % (ref 11.2–14.5)
WBC: 5.6 10*3/uL (ref 3.9–10.3)

## 2015-11-02 LAB — COMPREHENSIVE METABOLIC PANEL
ALT: 11 U/L (ref 0–55)
ANION GAP: 8 meq/L (ref 3–11)
AST: 26 U/L (ref 5–34)
Albumin: 3.4 g/dL — ABNORMAL LOW (ref 3.5–5.0)
Alkaline Phosphatase: 110 U/L (ref 40–150)
BUN: 12.9 mg/dL (ref 7.0–26.0)
CHLORIDE: 103 meq/L (ref 98–109)
CO2: 29 meq/L (ref 22–29)
CREATININE: 0.7 mg/dL (ref 0.6–1.1)
Calcium: 9.1 mg/dL (ref 8.4–10.4)
EGFR: 88 mL/min/{1.73_m2} — ABNORMAL LOW (ref >90)
Glucose: 111 mg/dl (ref 70–140)
POTASSIUM: 4.2 meq/L (ref 3.5–5.1)
Sodium: 140 mEq/L (ref 136–145)
Total Bilirubin: 0.36 mg/dL (ref 0.20–1.20)
Total Protein: 6.9 g/dL (ref 6.4–8.3)

## 2015-11-02 LAB — I-STAT TROPONIN, ED: TROPONIN I, POC: 0.01 ng/mL (ref 0.00–0.08)

## 2015-11-02 LAB — LIPASE, BLOOD: Lipase: 30 U/L (ref 11–51)

## 2015-11-02 MED ORDER — IOPAMIDOL (ISOVUE-370) INJECTION 76%
100.0000 mL | Freq: Once | INTRAVENOUS | Status: AC | PRN
  Administered 2015-11-02: 100 mL via INTRAVENOUS

## 2015-11-02 MED ORDER — HYDROCODONE-ACETAMINOPHEN 5-325 MG PO TABS
1.0000 | ORAL_TABLET | Freq: Four times a day (QID) | ORAL | 0 refills | Status: DC | PRN
Start: 2015-11-02 — End: 2015-11-28

## 2015-11-02 NOTE — ED Provider Notes (Signed)
[facility] DEPT Provider Note   CSN: [id number] Arrival date & time: 11/02/15  1207  First Provider Contact:  First MD Initiated Contact with Patient 11/02/15 1306        History   Chief Complaint Chief Complaint  Patient presents with  . Chest Pain  . Back Pain    HPI Colleen Kotlarz is a 70 y.o. female.  The history is provided by the patient. No language interpreter was used.  Chest Pain  Associated symptoms: chest pain   Back Pain     Lilleigh Hechavarria is a 70 y.o. female who presents to the Emergency Department complaining of chest pain/back pain.  She has a history of stage II lung cancer, status post radiation therapy. Last night she developed epigastric chest pain and back pain that was sudden in onset. It is a pressure type sensation and has been constant since it began. There are no alleviating or worsening factors. She has no fever, nausea, vomiting, shortness of breath. She has experienced 2 weeks of intermittent leg swelling. No history of blood clots and she takes no blood thinners or medications.  Past Medical History:  Diagnosis Date  . 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 Arthritis   . Asthma   . Cancer ([place])    right lung  . Constipation   . COPD (chronic obstructive pulmonary disease) ([place])   . Hypertension   . Hyperthyroidism    PMH  . Lung nodule   . Pneumonia   . Radiation 05/05/15-06/17/15   right central chest area 64 gray  . Umbilical hernia     Patient Active Problem List   Diagnosis Date Noted  . Fatigue 07/05/2015  . 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 COPD (chronic obstructive pulmonary disease) ([place]) 03/26/2013  . Pulmonary nodules 03/26/2013  . Tobacco use disorder 03/26/2013    Past Surgical History:  Procedure Laterality Date  . APPENDECTOMY    . DILATION AND CURETTAGE OF UTERUS    . MEDIASTINOSCOPY N/A 04/12/2015   Procedure: MEDIASTINOSCOPY;  Surgeon: Grace Isaac, MD;  Location: [facility];   Service: Thoracic;  Laterality: N/A;  . VIDEO BRONCHOSCOPY Bilateral 03/18/2015   Procedure: VIDEO BRONCHOSCOPY WITH FLUORO;  Surgeon: Collene Gobble, MD;  Location: [facility];  Service: Cardiopulmonary;  Laterality: Bilateral;  . VIDEO BRONCHOSCOPY WITH ENDOBRONCHIAL ULTRASOUND N/A 04/12/2015   Procedure: VIDEO BRONCHOSCOPY WITH ENDOBRONCHIAL ULTRASOUND;  Surgeon: Grace Isaac, MD;  Location: [facility] OR;  Service: Thoracic;  Laterality: N/A;    OB History    No data available       Home Medications    Prior to Admission medications   Medication Sig Start Date End Date Taking? Authorizing Provider  Acetaminophen (TYLENOL) 325 MG CAPS Take 500 mg by mouth every 6 (six) hours as needed. Reported on 08/04/2015   Yes Historical Provider, MD  albuterol (PROVENTIL HFA;VENTOLIN HFA) 108 (90 Base) MCG/ACT inhaler Inhale 2 puffs into the lungs every 6 (six) hours as needed for wheezing or shortness of breath.   Yes Historical Provider, MD  budesonide-formoterol (SYMBICORT) 160-4.5 MCG/ACT inhaler INHALE TWO PUFFS INTO LUNGS TWICE DAILY 02/08/15  Yes Collene Gobble, MD  dorzolamide (TRUSOPT) 2 % ophthalmic solution Place 1 drop into both eyes 2 (two) times daily.   Yes Historical Provider, MD  Ascorbic Acid (VITAMIN C) 1000 MG tablet Take 1,000 mg by mouth daily. Reported on 08/04/2015    Historical Provider, MD  chlorpheniramine-HYDROcodone ([place]) 10-8 MG/5ML SUER Take  5 mLs by mouth every 12 (twelve) hours as needed for cough. Patient not taking: Reported on 11/02/2015 08/11/15   Gery Pray, MD  cholecalciferol (VITAMIN D) 1000 UNITS tablet Take 2,000 Units by mouth daily. Reported on 08/04/2015    Historical Provider, MD  Cyanocobalamin (B-12 PO) Take 1 tablet by mouth daily. Reported on 08/04/2015    Historical Provider, MD  furosemide (LASIX) 20 MG tablet Take 1 tablet (20 mg total) by mouth daily. Patient not taking: Reported on 08/04/2015 05/31/15   Gery Pray, MD  GARLIC PO Take 1 capsule by  mouth daily. Reported on 07/05/2015    Historical Provider, MD  HYDROcodone-acetaminophen (NORCO/VICODIN) 5-325 MG tablet Take 1 tablet by mouth every 6 (six) hours as needed. 11/02/15   Quintella Reichert, MD  LORazepam (ATIVAN) 0.5 MG tablet Take 1 tablet (0.5 mg total) by mouth 2 (two) times daily. Patient not taking: Reported on 08/04/2015 04/08/15   Susanne Borders, NP  Multiple Vitamins-Minerals (PRESERVISION AREDS 2 PO) Take 1 capsule by mouth daily. Reported on 07/05/2015    Historical Provider, MD  predniSONE (DELTASONE) 10 MG tablet Take '40mg'$  daily for 3 days, then '30mg'$  daily for 3 days, then '20mg'$  daily for 3 days, then '10mg'$  daily for 3 days, then stop Patient not taking: Reported on 11/02/2015 07/08/15   Collene Gobble, MD  Pyridoxine HCl (B-6 PO) Take 1 tablet by mouth daily. Reported on 08/04/2015    Historical Provider, MD  Spacer/Aero-Holding Chambers (AEROCHAMBER MV) inhaler Use as instructed Patient not taking: Reported on 08/04/2015 07/17/13   Collene Gobble, MD  Stinging Nettle 2 % POWD by Does not apply route daily. Reported on 07/04/2015    Historical Provider, MD  TURMERIC CURCUMIN PO Take 1 capsule by mouth daily. Reported on 07/05/2015    Historical Provider, MD    Family History Family History  Problem Relation Age of Onset  . Breast cancer Maternal Grandmother     Social History Social History  Substance Use Topics  . Smoking status: Current Every Day Smoker    Packs/day: 0.75    Years: 50.00    Types: Cigarettes  . Smokeless tobacco: Never Used  . Alcohol use No     Allergies   Amoxicillin and Penicillins   Review of Systems Review of Systems  Cardiovascular: Positive for chest pain.  Musculoskeletal: Positive for back pain.  All other systems reviewed and are negative.    Physical Exam Updated Vital Signs BP 154/72   Pulse 74   Temp 97.7 F (36.5 C) (Oral)   Resp 23   Ht '5\' 4"'$  (1.626 m)   Wt 124 lb 9 oz (56.5 kg)   SpO2 97%   BMI 21.38 kg/m    Physical Exam  Constitutional: She is oriented to person, place, and time. She appears well-developed and well-nourished.  HENT:  Head: Normocephalic and atraumatic.  Cardiovascular: Normal rate and regular rhythm.   No murmur heard. Pulmonary/Chest: Effort normal and breath sounds normal. No respiratory distress.  Abdominal: Soft. There is no tenderness. There is no rebound and no guarding.  Musculoskeletal:  2-3+ pitting edema bilateral lower extremities  Neurological: She is alert and oriented to person, place, and time.  Skin: Skin is warm and dry.  Psychiatric: She has a normal mood and affect. Her behavior is normal.  Nursing note and vitals reviewed.    ED Treatments / Results  Labs (all labs ordered are listed, but only abnormal results are displayed) Labs Reviewed  LIPASE, BLOOD  I-STAT TROPOININ, ED    EKG  EKG Interpretation  Date/Time:  Wednesday November 02 2015 12:18:19 EDT Ventricular Rate:  65 PR Interval:    QRS Duration: 100 QT Interval:  404 QTC Calculation: 420 R Axis:   82 Text Interpretation:  Sinus rhythm Borderline right axis deviation Baseline wander in lead(s) V3 Confirmed by Hazle Coca [phone]) on 11/02/2015 3:08:10 PM       Radiology Dg Chest 2 View  Result Date: 11/02/2015 CLINICAL DATA:  Cost as epigastric pain.  History of lung cancer. EXAM: CHEST  2 VIEW COMPARISON:  07/04/2015. FINDINGS: Lungs are hyperexpanded consistent with emphysema. Asymmetric apical pleural-parenchymal opacity, likely the left, persists and is stable. Right suprahilar density in this patient with central right upper lobe lung mass has decreased in the interval. The cardiopericardial silhouette is within normal limits for size. The visualized bony structures of the thorax are intact. IMPRESSION: Interval decrease in prominence of the right suprahilar mass. Emphysema. Bi apical asymmetric pleural parenchymal scarring. Electronically Signed   By: Misty Stanley M.D.   On:  11/02/2015 13:40  Ct Angio Chest Pe W/cm &/or Wo Cm  Result Date: 11/02/2015 CLINICAL DATA:  Central chest pain radiating into the back beginning last night. History of lung cancer. EXAM: CT ANGIOGRAPHY CHEST WITH CONTRAST TECHNIQUE: Multidetector CT imaging of the chest was performed using the standard protocol during bolus administration of intravenous contrast. Multiplanar CT image reconstructions and MIPs were obtained to evaluate the vascular anatomy. CONTRAST:  100 cc Isovue 370. COMPARISON:  CT chest 07/29/2015.  PA and lateral chest today. FINDINGS: No pulmonary embolus is identified. No pleural or pericardial effusion. Calcific aortic and coronary atherosclerosis is seen. Heart size is normal. Previously 1.8 cm right hilar lymph node today measures 1.2 cm on image 35. Lungs demonstrate emphysematous disease. Extensive biapical pleural and parenchymal scarring is again seen. Spiculated right upper lobe mass which measured 2.5 x 3.2 cm on the comparison study today measures 2.6 x 1.8 cm on image 27, series 11. 0.5 x 0.3 cm subpleural nodule along the minor fissure is unchanged on image 41. No new pulmonary nodule is identified. Imaged upper abdomen is unremarkable.  No focal bony abnormality. Review of the MIP images confirms the above findings. IMPRESSION: Negative for pulmonary embolus or acute disease. Decrease in the size of a right upper lobe pulmonary mass and right hilar lymph node. Emphysema. Calcific aortic and coronary atherosclerosis. Electronically Signed   By: Inge Rise M.D.   On: 11/02/2015 14:39   Procedures Procedures (including critical care time)  Medications Ordered in ED Medications  iopamidol (ISOVUE-370) 76 % injection 100 mL (100 mLs Intravenous Contrast Given 11/02/15 1408)     Initial Impression / Assessment and Plan / ED Course  I have reviewed the triage vital signs and the nursing notes.  Pertinent labs & imaging results that were available during my care  of the patient were reviewed by me and considered in my medical decision making (see chart for details).  Clinical Course    Patient with history of lung cancer here with a constant chest pain since last night. She declined pain medications in the emergency department. Presentation is not consistent with ACS or dissection. CT PE study obtained that was negative for acute pulmonary embolism or pneumonia. Her lung cancer does appear to be improved and decreased in size compared to previous. Discussed with patient unclear source of chest pain. Discussed home care, outpatient follow-up, return precautions. Outpatient  labs from earlier today were reviewed.  Final Clinical Impressions(s) / ED Diagnoses   Final diagnoses:  Chest pain, unspecified chest pain type    New Prescriptions Discharge Medication List as of 11/02/2015  3:07 PM       Quintella Reichert, MD 11/03/15 1112

## 2015-11-02 NOTE — ED Notes (Signed)
Patient was educated not to drive, operate heavy machinery, or drink alcohol while taking narcotic medication.  

## 2015-11-02 NOTE — ED Notes (Signed)
Patient had blood work done this am at cancer center, chem and CBC with diff has resulted so canceled the protocol orders for those lab tests.

## 2015-11-02 NOTE — ED Triage Notes (Signed)
Patient has lung cancer and c/o central chest pain that radiates to back that started last night.  Patient went to have a PET scan but decided that due to severity of pain and unable to lay still on back for scan, came here for evaluation.

## 2015-11-03 ENCOUNTER — Telehealth: Payer: Self-pay | Admitting: *Deleted

## 2015-11-03 ENCOUNTER — Ambulatory Visit: Payer: Managed Care, Other (non HMO) | Admitting: Internal Medicine

## 2015-11-03 ENCOUNTER — Telehealth: Payer: Self-pay | Admitting: Internal Medicine

## 2015-11-03 NOTE — Telephone Encounter (Signed)
Pt husband called went over the events from yesterday and previous events with pt scan not being authorized or scheduled.  Pt advised he would like to cancel tomorrows MD visit as there are no results to go over due scan was not done due to pt's chest pain and ED visit. POF to scheduling to r/s PET and MD visit. Husband thanked me for the call. No further concerns.

## 2015-11-03 NOTE — Telephone Encounter (Signed)
spoke with pt husband, pt in er, they need to resched PET scan, he insists he would like to talk to [place] first before scheduling

## 2015-11-04 ENCOUNTER — Ambulatory Visit: Payer: Managed Care, Other (non HMO) | Admitting: Internal Medicine

## 2015-11-07 ENCOUNTER — Encounter: Payer: Self-pay | Admitting: *Deleted

## 2015-11-07 NOTE — Progress Notes (Signed)
Oncology Nurse Navigator Documentation  Oncology Nurse Navigator Flowsheets 11/07/2015  Navigator Encounter Type Telephone  Telephone Outgoing Call  Treatment Phase Other  Barriers/Navigation Needs Coordination of Care  Interventions Coordination of Care  Coordination of Care Radiology  Acuity Level 2  Acuity Level 2 Assistance expediting appointments  Time Spent with Patient 30   I called to follow up regarding Ms. Ransford PET scan.  I spoke with patient's husband.  I notified him of the PET scan and pre procedure instructions.    Mr. Helling is still worried about Ms. Weinberg's chest and back pain she had last week.  He stated he will take her to an Urgent care for evaluation.   He was thankful for the help.

## 2015-11-07 NOTE — Progress Notes (Signed)
Oncology Nurse Navigator Documentation  Oncology Nurse Navigator Flowsheets 11/07/2015  Navigator Encounter Type Other  Treatment Phase Other  Barriers/Navigation Needs Coordination of Care  Interventions Coordination of Care  Coordination of Care Other  Acuity Level 2  Acuity Level 2 Other  Time Spent with Patient 73   Amber Santiago called and requested Dr. Mallie Mussel received a copy of latest scans and lab work.  Information faxed to Dr. Francee Piccolo office attention Dr. Mallie Mussel.  Amber Santiago was thankful for the help.

## 2015-11-15 ENCOUNTER — Other Ambulatory Visit: Payer: Self-pay | Admitting: *Deleted

## 2015-11-15 MED ORDER — ALBUTEROL SULFATE HFA 108 (90 BASE) MCG/ACT IN AERS: 2.0000 | INHALATION_SPRAY | RESPIRATORY_TRACT | 5 refills | Status: DC | PRN

## 2015-11-16 ENCOUNTER — Encounter (INDEPENDENT_AMBULATORY_CARE_PROVIDER_SITE_OTHER): Payer: Managed Care, Other (non HMO) | Admitting: Ophthalmology

## 2015-11-16 DIAGNOSIS — H353231 Exudative age-related macular degeneration, bilateral, with active choroidal neovascularization: Secondary | ICD-10-CM | POA: Diagnosis not present

## 2015-11-16 DIAGNOSIS — H2513 Age-related nuclear cataract, bilateral: Secondary | ICD-10-CM | POA: Diagnosis not present

## 2015-11-16 DIAGNOSIS — H43813 Vitreous degeneration, bilateral: Secondary | ICD-10-CM | POA: Diagnosis not present

## 2015-11-17 ENCOUNTER — Ambulatory Visit: Payer: Managed Care, Other (non HMO) | Admitting: Internal Medicine

## 2015-11-18 ENCOUNTER — Encounter: Payer: Self-pay | Admitting: *Deleted

## 2015-11-18 ENCOUNTER — Telehealth: Payer: Self-pay

## 2015-11-18 NOTE — Telephone Encounter (Signed)
I called husband and told him message below.

## 2015-11-18 NOTE — Telephone Encounter (Signed)
Husband called asking if the prednisone his wife is taking will interfere with her PET scan on 8/16.  An orthopedic MD gave  pt marcain/lidocaine/dexamethasone shots and put her on a prednisone taper for her back pain. The injections lasted for a day but the steriods are easing off the pain.

## 2015-11-21 ENCOUNTER — Telehealth: Payer: Self-pay | Admitting: Oncology

## 2015-11-21 NOTE — Telephone Encounter (Signed)
[name] called and said that Amber Santiago has been having severe pain between her shoulder blades that radiates around to her chest.  She said the right is worse than her left.  The pain started about 3 weeks ago.  She was scheduled for a PET scan and was not able to lie flat for the exam due to the pain.  She was sent to ER and was given pain medication which did not help and caused her to be short of breath.  He then took her to the urgent care on [address] and she was given 2 steroid shots of dexamethasone/lidocaine and a prednisone dose pack.  She had relief for that day and the pain returned.  She stopped taking the prednisone yesterday because it also caused her to be short of breath.  She also has been given Duexis (ibuprofen/famotidine) that relieves her pain for a short period of time.  [name] is asking if there is anything that can be done.  [name]'s PET scan has been rescheduled for Wednesday and he is not sure if she will be able to complete it again due to the pain.

## 2015-11-21 NOTE — Telephone Encounter (Signed)
Called [name] back and advised him that the best thing for Amber Santiago is to have the PET scan per Dr. Sondra Come.   Advised her to take her pain medication (Duexis) 1 hour to 45 minutes before the scan and also to take an ativan tablet if necessary.  [name] verbalized agreement and understanding.

## 2015-11-23 ENCOUNTER — Ambulatory Visit (HOSPITAL_COMMUNITY)
Admission: RE | Admit: 2015-11-23 | Discharge: 2015-11-23 | Disposition: A | Discharge status: Home / Self Care | Payer: Managed Care, Other (non HMO) | Source: Ambulatory Visit | Attending: Internal Medicine | Admitting: Internal Medicine

## 2015-11-23 DIAGNOSIS — M8588 Other specified disorders of bone density and structure, other site: Secondary | ICD-10-CM | POA: Insufficient documentation

## 2015-11-23 DIAGNOSIS — M438X4 Other specified deforming dorsopathies, thoracic region: Secondary | ICD-10-CM | POA: Diagnosis not present

## 2015-11-23 DIAGNOSIS — C3491 Malignant neoplasm of unspecified part of right bronchus or lung: Secondary | ICD-10-CM | POA: Diagnosis present

## 2015-11-23 LAB — GLUCOSE, CAPILLARY: GLUCOSE-CAPILLARY: 136 mg/dL — AB (ref 65–99)

## 2015-11-23 MED ORDER — FLUDEOXYGLUCOSE F - 18 (FDG) INJECTION
6.2500 | Freq: Once | INTRAVENOUS | Status: AC | PRN
  Administered 2015-11-23: 6.25 via INTRAVENOUS

## 2015-11-24 ENCOUNTER — Telehealth: Payer: Self-pay | Admitting: *Deleted

## 2015-11-24 NOTE — Telephone Encounter (Signed)
Oncology Nurse Navigator Documentation  Oncology Nurse Navigator Flowsheets 11/24/2015  Navigator Encounter Type Telephone  Telephone Outgoing Call/ I received vm message from Mr. Renken.  He stated his wife is still having pain and may need muscle relaxant.  I updated Dr. Julien Nordmann.  He stated patient may be having pain from other issues like arthritis.  I called patient's husband and left vm message with the above information.  I asked that he call me if needed to discuss further.    Treatment Phase Other  Barriers/Navigation Needs Education  Education Other  Interventions Education Method  Education Method Verbal  Acuity Level 2  Acuity Level 2 Educational needs  Time Spent with Patient 30

## 2015-11-28 ENCOUNTER — Encounter: Payer: Self-pay | Admitting: Internal Medicine

## 2015-11-28 ENCOUNTER — Ambulatory Visit (HOSPITAL_BASED_OUTPATIENT_CLINIC_OR_DEPARTMENT_OTHER): Payer: Managed Care, Other (non HMO) | Admitting: Internal Medicine

## 2015-11-28 VITALS — BP 158/74 | HR 84 | Temp 97.9°F | Resp 17 | Ht 64.0 in | Wt 125.3 lb

## 2015-11-28 DIAGNOSIS — C3411 Malignant neoplasm of upper lobe, right bronchus or lung: Secondary | ICD-10-CM

## 2015-11-28 DIAGNOSIS — C3491 Malignant neoplasm of unspecified part of right bronchus or lung: Secondary | ICD-10-CM

## 2015-11-28 DIAGNOSIS — M4854XA Collapsed vertebra, not elsewhere classified, thoracic region, initial encounter for fracture: Secondary | ICD-10-CM | POA: Insufficient documentation

## 2015-11-28 HISTORY — DX: Collapsed vertebra, not elsewhere classified, thoracic region, initial encounter for fracture: M48.54XA

## 2015-11-28 NOTE — Progress Notes (Signed)
[facility] Telephone:([phone]) [phone]   [phone]  OFFICE PROGRESS NOTE  Pcp Not In System No address on file  DIAGNOSIS: Stage IIB (T3, N0, M0) non-small cell lung cancer, squamous cell carcinoma diagnosed in December 2016 and presenting with large right upper lobe lung mass in addition to a satellite nodule.  PRIOR THERAPY: Definitive radiotherapy under the care of Dr. Sondra Come for a total dose of 64 gray completed 06/17/2015.  CURRENT THERAPY: Observation  INTERVAL HISTORY: Amber Santiago 70 y.o. female returns to the clinic today for visit accompanied by her husband. The patient is very anxious today. She still complaining of pain in the upper part of the back started a month ago. She has CT angiogram of the chest on 11/02/2015 because of persistent chest pain with radiation to the back and it showed no concerning findings and improvement of the right upper lobe pulmonary mass and right hilar lymph node. Because of her persistent back pain and the patient underwent a PET scan on 11/23/2015 and she is here for evaluation and discussion of her scan results. She was seen by her primary care physician and given some pain medication with mild improvement of her pain. She denied having any other significant complaints but very anxious about her condition. She denied having any nausea, vomiting, diarrhea or constipation. She has no headache or visual changes. She would like to travel to [place] in the next 2 months.  MEDICAL HISTORY: Past Medical History:  Diagnosis Date  . 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 Arthritis   . Asthma   . Cancer ([place])    right lung  . Constipation   . COPD (chronic obstructive pulmonary disease) ([place])   . Hypertension   . Hyperthyroidism    PMH  . Lung nodule   . Pneumonia   . Radiation 05/05/15-06/17/15   right central chest area 64 gray  . Umbilical hernia     ALLERGIES:  is allergic to amoxicillin and  penicillins.  MEDICATIONS:  Current Outpatient Prescriptions  Medication Sig Dispense Refill  . albuterol (PROAIR HFA) 108 (90 Base) MCG/ACT inhaler Inhale 2 puffs into the lungs every 4 (four) hours as needed for wheezing or shortness of breath. 1 Inhaler 5  . budesonide-formoterol (SYMBICORT) 160-4.5 MCG/ACT inhaler INHALE TWO PUFFS INTO LUNGS TWICE DAILY 11 g 11  . dorzolamide (TRUSOPT) 2 % ophthalmic solution Place 1 drop into both eyes 2 (two) times daily.    . Multiple Vitamins-Minerals (PRESERVISION AREDS 2 PO) Take 1 capsule by mouth daily. Reported on 07/05/2015    . Spacer/Aero-Holding Chambers (AEROCHAMBER MV) inhaler Use as instructed 1 each 0  . Acetaminophen (TYLENOL) 325 MG CAPS Take 500 mg by mouth every 6 (six) hours as needed. Reported on 08/04/2015    . Cyanocobalamin (B-12 PO) Take 1 tablet by mouth daily. Reported on 08/04/2015    . Pyridoxine HCl (B-6 PO) Take 1 tablet by mouth daily. Reported on 08/04/2015     No current facility-administered medications for this visit.     SURGICAL HISTORY:  Past Surgical History:  Procedure Laterality Date  . APPENDECTOMY    . DILATION AND CURETTAGE OF UTERUS    . MEDIASTINOSCOPY N/A 04/12/2015   Procedure: MEDIASTINOSCOPY;  Surgeon: Grace Isaac, MD;  Location: [facility];  Service: Thoracic;  Laterality: N/A;  . VIDEO BRONCHOSCOPY Bilateral 03/18/2015   Procedure: VIDEO BRONCHOSCOPY WITH FLUORO;  Surgeon: Collene Gobble, MD;  Location: [facility];  Service: Cardiopulmonary;  Laterality: Bilateral;  . VIDEO BRONCHOSCOPY WITH ENDOBRONCHIAL ULTRASOUND N/A 04/12/2015   Procedure: VIDEO BRONCHOSCOPY WITH ENDOBRONCHIAL ULTRASOUND;  Surgeon: Grace Isaac, MD;  Location: [facility];  Service: Thoracic;  Laterality: N/A;    REVIEW OF SYSTEMS:  Constitutional: positive for fatigue Eyes: negative Ears, nose, mouth, throat, and face: negative Respiratory: negative Cardiovascular: negative Gastrointestinal:  negative Genitourinary:negative Integument/breast: negative Hematologic/lymphatic: negative Musculoskeletal:positive for back pain Neurological: negative Behavioral/Psych: negative Endocrine: negative Allergic/Immunologic: negative   PHYSICAL EXAMINATION: General appearance: alert, cooperative, fatigued and no distress Head: Normocephalic, without obvious abnormality, atraumatic Neck: no adenopathy, no JVD, supple, symmetrical, trachea midline and thyroid not enlarged, symmetric, no tenderness/mass/nodules Lymph nodes: Cervical, supraclavicular, and axillary nodes normal. Resp: wheezes bilaterally Back: symmetric, no curvature. ROM normal. No CVA tenderness. Cardio: regular rate and rhythm, S1, S2 normal, no murmur, click, rub or gallop GI: soft, non-tender; bowel sounds normal; no masses,  no organomegaly Extremities: extremities normal, atraumatic, no cyanosis or edema Neurologic: Alert and oriented X 3, normal strength and tone. Normal symmetric reflexes. Normal coordination and gait  ECOG PERFORMANCE STATUS: 1 - Symptomatic but completely ambulatory  Blood pressure (!) 158/74, pulse 84, temperature 97.9 F (36.6 C), temperature source Oral, resp. rate 17, height '5\' 4"'$  (1.626 m), weight 125 lb 4.8 oz (56.8 kg), SpO2 97 %.  LABORATORY DATA: Lab Results  Component Value Date   WBC 5.6 11/02/2015   HGB 13.7 11/02/2015   HCT 41.3 11/02/2015   MCV 88.7 11/02/2015   PLT 209 11/02/2015      Chemistry      Component Value Date/Time   NA 140 11/02/2015 1127   K 4.2 11/02/2015 1127   CL 99 (L) 04/06/2015 1242   CO2 29 11/02/2015 1127   BUN 12.9 11/02/2015 1127   CREATININE 0.7 11/02/2015 1127      Component Value Date/Time   CALCIUM 9.1 11/02/2015 1127   ALKPHOS 110 11/02/2015 1127   AST 26 11/02/2015 1127   ALT 11 11/02/2015 1127   BILITOT 0.36 11/02/2015 1127       RADIOGRAPHIC STUDIES: Dg Chest 2 View  Result Date: 11/02/2015 CLINICAL DATA:  Cost as epigastric  pain.  History of lung cancer. EXAM: CHEST  2 VIEW COMPARISON:  07/04/2015. FINDINGS: Lungs are hyperexpanded consistent with emphysema. Asymmetric apical pleural-parenchymal opacity, likely the left, persists and is stable. Right suprahilar density in this patient with central right upper lobe lung mass has decreased in the interval. The cardiopericardial silhouette is within normal limits for size. The visualized bony structures of the thorax are intact. IMPRESSION: Interval decrease in prominence of the right suprahilar mass. Emphysema. Bi apical asymmetric pleural parenchymal scarring. Electronically Signed   By: Misty Stanley M.D.   On: 11/02/2015 13:40  Ct Angio Chest Pe W/cm &/or Wo Cm  Result Date: 11/02/2015 CLINICAL DATA:  Central chest pain radiating into the back beginning last night. History of lung cancer. EXAM: CT ANGIOGRAPHY CHEST WITH CONTRAST TECHNIQUE: Multidetector CT imaging of the chest was performed using the standard protocol during bolus administration of intravenous contrast. Multiplanar CT image reconstructions and MIPs were obtained to evaluate the vascular anatomy. CONTRAST:  100 cc Isovue 370. COMPARISON:  CT chest 07/29/2015.  PA and lateral chest today. FINDINGS: No pulmonary embolus is identified. No pleural or pericardial effusion. Calcific aortic and coronary atherosclerosis is seen. Heart size is normal. Previously 1.8 cm right hilar lymph node today measures 1.2 cm on image 35. Lungs demonstrate emphysematous disease. Extensive  biapical pleural and parenchymal scarring is again seen. Spiculated right upper lobe mass which measured 2.5 x 3.2 cm on the comparison study today measures 2.6 x 1.8 cm on image 27, series 11. 0.5 x 0.3 cm subpleural nodule along the minor fissure is unchanged on image 41. No new pulmonary nodule is identified. Imaged upper abdomen is unremarkable.  No focal bony abnormality. Review of the MIP images confirms the above findings. IMPRESSION: Negative  for pulmonary embolus or acute disease. Decrease in the size of a right upper lobe pulmonary mass and right hilar lymph node. Emphysema. Calcific aortic and coronary atherosclerosis. Electronically Signed   By: Inge Rise M.D.   On: 11/02/2015 14:39  Nm Pet Image Restag (ps) Skull Base To Thigh  Result Date: 11/23/2015 CLINICAL DATA:  Subsequent treatment strategy for restaging of lung cancer. EXAM: NUCLEAR MEDICINE PET SKULL BASE TO THIGH TECHNIQUE: 6.2 mCi F-18 FDG was injected intravenously. Full-ring PET imaging was performed from the skull base to thigh after the radiotracer. CT data was obtained and used for attenuation correction and anatomic localization. FASTING BLOOD GLUCOSE:  Value: 136 mg/dl COMPARISON:  PET of 02/25/2015.  Most recent chest CT of 11/02/2015. FINDINGS: NECK No areas of abnormal hypermetabolism. CHEST Central right upper lobe lung mass measures 3.7 x 2.2 cm and a S.U.V. max of 3.7 on image 21/series 8. This may have enlarged minimally from 2.6 x 1.8 cm on 11/02/2015. 4.0 x 3.0 cm and a S.U.V. max of 27 on the prior PET. No low residual hypermetabolic thoracic adenopathy. ABDOMEN/PELVIS No abnormal soft tissue hypermetabolism within the abdomen or pelvis. SKELETON A T6 compression deformity is new since 11/02/2015. This is moderate and corresponds to low-level hypermetabolism, measured a S.U.V. max of 3.2. Foci of hypermetabolism about posterior medial left fifth and sixth ribs likely correspond to subtle osseous irregularity. These measure a S.U.V. max of 3.3 on image 19/ series 8 and a S.U.V. max of 3.8 on image 23/series 8. These areas are new since the prior PET and CT findings are new and compared to07/26/2017. No underlying osseous lesions in this area on prior diagnostic CT. CT IMAGES PERFORMED FOR ATTENUATION CORRECTION Bilateral carotid atherosclerosis. No cervical adenopathy. Multivessel coronary artery atherosclerosis. Biapical pleural-parenchymal scarring. Left upper  lobe 4 mm pulmonary nodule is unchanged on image 19/series 8. Normal adrenal glands. Abdominal aortic atherosclerosis. Non aneurysmal infrarenal aortic dilatation on the order of 2.6 cm, similar. Colonic stool burden suggests constipation. Mild pelvic floor laxity. Mild osteopenia. IMPRESSION: 1. Since the prior PET of 02/25/2015, decrease in size and hypermetabolism of right upper lobe lung mass. Of note, this may have enlarged since the most recent diagnostic CT of 11/02/2015. 2. No evidence of thoracic nodal or extrathoracic metastatic disease. 3. T6 compression deformity is new since 11/02/2015 and favored to be benign/due to osteopenia. Hypermetabolism within is mild and favored to be secondary. Similarly, hypermetabolism about posterior medial left fifth and sixth ribs is favored to be posttraumatic. Electronically Signed   By: Abigail Miyamoto M.D.   On: 11/23/2015 14:33    ASSESSMENT AND PLAN: This is a very pleasant 70 years old white female recently diagnosed with a stage IIb non-small cell lung cancer, squamous cell carcinoma presented with large right upper lobe lung mass as well as satellite nodule. Evaluation of the mediastinal lymph nodes with repeat bronchoscopy, endobronchial ultrasound and biopsies as well as mediastinoscopy showed no evidence of metastatic disease to the mediastinal or hilar lymph nodes. The patient completed a  course of curative radiotherapy under the care of Dr. Sondra Come and tolerating it well.  The recent CT angiogram of the chest as well as a PET scan showed no concerning findings for disease progression. The PET scan showed decrease in the size and hypermetabolic activity of the right upper lobe lung mass. There was no evidence of thoracic nodal or extrathoracic metastatic disease. There was T6 compression deformity that was new and favored to be benign/due to osteopenia. This is most likely the cause of her back pain. I had a lengthy discussion with the patient and her  husband about her current condition. I recommended for her to continue on observation with repeat CT scan of the chest in 4 months for restaging of her disease. For the persistent back pain and T6 compression deformity, I will refer the patient to orthopedic surgery for evaluation and further management of her condition. She was advised to continue on her pain medication for now. The patient was advised to call immediately if she has any concerning symptoms in the interval. The patient voices understanding of current disease status and treatment options and is in agreement with the current care plan.  All questions were answered. The patient knows to call the clinic with any problems, questions or concerns. We can certainly see the patient much sooner if necessary.  Disclaimer: This note was dictated with voice recognition software. Similar sounding words can inadvertently be transcribed and may not be corrected upon review.

## 2015-11-28 NOTE — Patient Instructions (Signed)
Smoking Cessation, Tips for Success If you are ready to quit smoking, congratulations! You have chosen to help yourself be healthier. Cigarettes bring nicotine, tar, carbon monoxide, and other irritants into your body. Your lungs, heart, and blood vessels will be able to work better without these poisons. There are many different ways to quit smoking. Nicotine gum, nicotine patches, a nicotine inhaler, or nicotine nasal spray can help with physical craving. Hypnosis, support groups, and medicines help break the habit of smoking. WHAT THINGS CAN I DO TO MAKE QUITTING EASIER?  Here are some tips to help you quit for good:  Pick a date when you will quit smoking completely. Tell all of your friends and family about your plan to quit on that date.  Do not try to slowly cut down on the number of cigarettes you are smoking. Pick a quit date and quit smoking completely starting on that day.  Throw away all cigarettes.   Clean and remove all ashtrays from your home, work, and car.  On a card, write down your reasons for quitting. Carry the card with you and read it when you get the urge to smoke.  Cleanse your body of nicotine. Drink enough water and fluids to keep your urine clear or pale yellow. Do this after quitting to flush the nicotine from your body.  Learn to predict your moods. Do not let a bad situation be your excuse to have a cigarette. Some situations in your life might tempt you into wanting a cigarette.  Never have "just one" cigarette. It leads to wanting another and another. Remind yourself of your decision to quit.  Change habits associated with smoking. If you smoked while driving or when feeling stressed, try other activities to replace smoking. Stand up when drinking your coffee. Brush your teeth after eating. Sit in a different chair when you read the paper. Avoid alcohol while trying to quit, and try to drink fewer caffeinated beverages. Alcohol and caffeine may urge you to  smoke.  Avoid foods and drinks that can trigger a desire to smoke, such as sugary or spicy foods and alcohol.  Ask people who smoke not to smoke around you.  Have something planned to do right after eating or having a cup of coffee. For example, plan to take a walk or exercise.  Try a relaxation exercise to calm you down and decrease your stress. Remember, you may be tense and nervous for the first 2 weeks after you quit, but this will pass.  Find new activities to keep your hands busy. Play with a pen, coin, or rubber band. Doodle or draw things on paper.  Brush your teeth right after eating. This will help cut down on the craving for the taste of tobacco after meals. You can also try mouthwash.   Use oral substitutes in place of cigarettes. Try using lemon drops, carrots, cinnamon sticks, or chewing gum. Keep them handy so they are available when you have the urge to smoke.  When you have the urge to smoke, try deep breathing.  Designate your home as a nonsmoking area.  If you are a heavy smoker, ask your health care provider about a prescription for nicotine chewing gum. It can ease your withdrawal from nicotine.  Reward yourself. Set aside the cigarette money you save and buy yourself something nice.  Look for support from others. Join a support group or smoking cessation program. Ask someone at home or at work to help you with your plan   to quit smoking.  Always ask yourself, "Do I need this cigarette or is this just a reflex?" Tell yourself, "Today, I choose not to smoke," or "I do not want to smoke." You are reminding yourself of your decision to quit.  Do not replace cigarette smoking with electronic cigarettes (commonly called e-cigarettes). The safety of e-cigarettes is unknown, and some may contain harmful chemicals.  If you relapse, do not give up! Plan ahead and think about what you will do the next time you get the urge to smoke. HOW WILL I FEEL WHEN I QUIT SMOKING? You  may have symptoms of withdrawal because your body is used to nicotine (the addictive substance in cigarettes). You may crave cigarettes, be irritable, feel very hungry, cough often, get headaches, or have difficulty concentrating. The withdrawal symptoms are only temporary. They are strongest when you first quit but will go away within 10-14 days. When withdrawal symptoms occur, stay in control. Think about your reasons for quitting. Remind yourself that these are signs that your body is healing and getting used to being without cigarettes. Remember that withdrawal symptoms are easier to treat than the major diseases that smoking can cause.  Even after the withdrawal is over, expect periodic urges to smoke. However, these cravings are generally short lived and will go away whether you smoke or not. Do not smoke! WHAT RESOURCES ARE AVAILABLE TO HELP ME QUIT SMOKING? Your health care provider can direct you to community resources or hospitals for support, which may include:  Group support.  Education.  Hypnosis.  Therapy.   This information is not intended to replace advice given to you by your health care provider. Make sure you discuss any questions you have with your health care provider.   Document Released: 12/23/2003 Document Revised: 04/16/2014 Document Reviewed: 09/11/2012 Elsevier Interactive Patient Education 2016 [employer]  

## 2015-11-29 ENCOUNTER — Encounter: Payer: Self-pay | Admitting: *Deleted

## 2015-11-29 NOTE — Progress Notes (Signed)
Oncology Nurse Navigator Documentation  Oncology Nurse Navigator Flowsheets 11/29/2015  Navigator Encounter Type Telephone/Mr. Lortz called.  He stated he would like his wife referral to orthopedic doctor as quickly as possible. I updated schedule of their need.    Telephone Incoming Call  Treatment Phase Other  Barriers/Navigation Needs Coordination of Care  Interventions Coordination of Care  Coordination of Care Other  Acuity Level 1  Acuity Level 1 Minimal follow up required  Time Spent with Patient 30

## 2015-12-01 ENCOUNTER — Telehealth: Payer: Self-pay | Admitting: *Deleted

## 2015-12-01 NOTE — Telephone Encounter (Signed)
Oncology Nurse Navigator Documentation  Oncology Nurse Navigator Flowsheets 12/01/2015  Navigator Encounter Type Telephone/I received a call from Amber Santiago.  He is wondering about orthopedic appt.  I called him and his wife back but was unable to get anyone. I left vm message with both of them and updated them that scheduling is working on this appt.   Telephone Outgoing Call  Treatment Phase Post-Tx Follow-up  Barriers/Navigation Needs Coordination of Care  Interventions Coordination of Care  Coordination of Care Other  Acuity Level 1  Acuity Level 1 Minimal follow up required  Time Spent with Patient 30

## 2015-12-02 ENCOUNTER — Telehealth: Payer: Self-pay | Admitting: Internal Medicine

## 2015-12-02 NOTE — Telephone Encounter (Signed)
CALLED PATIENT TO CONF APPT PER 08/21 LOS. L/M APPT SCHD AND LTR MAILED.

## 2015-12-14 ENCOUNTER — Encounter (INDEPENDENT_AMBULATORY_CARE_PROVIDER_SITE_OTHER): Payer: Managed Care, Other (non HMO) | Admitting: Ophthalmology

## 2015-12-14 DIAGNOSIS — H353231 Exudative age-related macular degeneration, bilateral, with active choroidal neovascularization: Secondary | ICD-10-CM

## 2015-12-14 DIAGNOSIS — H2513 Age-related nuclear cataract, bilateral: Secondary | ICD-10-CM

## 2015-12-14 DIAGNOSIS — H43813 Vitreous degeneration, bilateral: Secondary | ICD-10-CM

## 2015-12-19 ENCOUNTER — Telehealth: Payer: Self-pay | Admitting: Internal Medicine

## 2015-12-19 NOTE — Telephone Encounter (Signed)
Left message for patient re orthopedic appointment.    Dr. Rolena Infante 12/22/15 @ 3:15 om to arrive 2:45 pm  [facility] [address]  [place], [place] [phone] - fax Spoke with [facility]   Records faxed

## 2015-12-21 ENCOUNTER — Telehealth: Payer: Self-pay | Admitting: Internal Medicine

## 2015-12-21 NOTE — Telephone Encounter (Signed)
RECEIVED MESSAGE FROM PATIENT'S SPOUSE YESTERDAY RE CANCELLING 9/14 APPOINTMENT AT [facility] DUE TO ANOTHER APPOINTMENT CONFLICT.   RETURNED CALL THIS MORNING CONFIRMING RECEIVING INFORMATION. SPOUSE INFORMED THAT I WOULD CALLED [facility] TO CANCELL AND PATIENT/SPOUSE COULD TOUCH BASED WITH [facility] TO SCHEDULE A NEW DATE/TIME MORE CONVENIENT FOR THEM.   CALLED [facility] AND SPOKE WITH ERICA REGARDING  CANCELLATION. PER ERICA PATIENT'S SPOUSE HAS ALREADY CANCELLED APPOINTMENT, HOWEVER HE HAS NOT RESCHEDULED AT THIS TIME.   ERICA WAS INFORMED THAT PATIENT/SPOUSED HAS BEEN INFORMED TO CALL RESCHEDULE WITH [facility] FOR ANOTHER DATE/TIME MORE CONVENIENT FOR THEM.

## 2016-01-11 ENCOUNTER — Encounter (INDEPENDENT_AMBULATORY_CARE_PROVIDER_SITE_OTHER): Payer: Self-pay | Admitting: Ophthalmology

## 2016-01-11 DIAGNOSIS — H43813 Vitreous degeneration, bilateral: Secondary | ICD-10-CM

## 2016-01-11 DIAGNOSIS — H353231 Exudative age-related macular degeneration, bilateral, with active choroidal neovascularization: Secondary | ICD-10-CM

## 2016-01-16 ENCOUNTER — Telehealth: Payer: Self-pay | Admitting: Emergency Medicine

## 2016-01-16 MED ORDER — BUDESONIDE-FORMOTEROL FUMARATE 160-4.5 MCG/ACT IN AERO: 2.0000 | INHALATION_SPRAY | Freq: Two times a day (BID) | RESPIRATORY_TRACT | 0 refills | Status: DC

## 2016-01-16 NOTE — Telephone Encounter (Signed)
Spoke with pt husband - [name] states that all he needs is to let the MD know that she is needing Symbicort samples - [name] states that his insurance has ended and he is in the process of trying to get new insurance. [name] states that they cannot afford to pay out of pocket for this medication. Aware that I will place 4 samples up front. Nothing further needed.

## 2016-01-18 ENCOUNTER — Telehealth: Payer: Self-pay | Admitting: Emergency Medicine

## 2016-01-18 NOTE — Telephone Encounter (Signed)
Pt husband [name] calling regarding the patient's Albuterol HFA.  Pt wanting to change to alternative medication as the Proair - [name] is going to check with pharmacy to see which would be cheaper -- the Ventolin or Proventil. [name] plans to call back this week. Will keep this encounter open until he calls.

## 2016-01-20 NOTE — Telephone Encounter (Signed)
Patient husband returning call - can be reached at [phone]

## 2016-01-20 NOTE — Telephone Encounter (Signed)
We have not yet heard back from pt  Called [facility] and spoke with Wells Guiles.  Pt has had an insurance change (she last paid $25 for ProAir August 2017 through [facility]).  The cash prices are listed below because they do not have her new insurance on file:  Proair $62 Ventolin $63.71 Proventil < $90  LMOM TCB x1 for pt/spouse [name]

## 2016-01-20 NOTE — Telephone Encounter (Signed)
Pt's husband states he lost his job and they have no insurance at this time. They are unable to get health insurance. Pt's spouse aware that he can go through company that makes Ventolin to see about getting free medication for his wife. [name] states he will go through company for Ventolin and will keep [place] updated when he has forms to bring in for RB to sign. Nothing more needed at this time.

## 2016-02-03 ENCOUNTER — Telehealth: Payer: Self-pay | Admitting: Emergency Medicine

## 2016-02-03 MED ORDER — ALBUTEROL SULFATE HFA 108 (90 BASE) MCG/ACT IN AERS: 2.0000 | INHALATION_SPRAY | Freq: Four times a day (QID) | RESPIRATORY_TRACT | 5 refills | Status: DC | PRN

## 2016-02-03 NOTE — Telephone Encounter (Signed)
Spoke with pt's husband. States that pt needs a refill on Ventolin. Rx has been sent in. Nothing further was needed.

## 2016-02-06 ENCOUNTER — Encounter (INDEPENDENT_AMBULATORY_CARE_PROVIDER_SITE_OTHER): Payer: Self-pay | Admitting: Ophthalmology

## 2016-02-06 ENCOUNTER — Other Ambulatory Visit: Payer: Self-pay | Admitting: *Deleted

## 2016-02-06 DIAGNOSIS — H353231 Exudative age-related macular degeneration, bilateral, with active choroidal neovascularization: Secondary | ICD-10-CM

## 2016-02-06 DIAGNOSIS — H43813 Vitreous degeneration, bilateral: Secondary | ICD-10-CM

## 2016-02-06 MED ORDER — ALBUTEROL SULFATE HFA 108 (90 BASE) MCG/ACT IN AERS: 2.0000 | INHALATION_SPRAY | Freq: Four times a day (QID) | RESPIRATORY_TRACT | 3 refills | Status: DC | PRN

## 2016-02-06 NOTE — Telephone Encounter (Signed)
Forms have been filled out and given to RB to sign.

## 2016-02-06 NOTE — Telephone Encounter (Signed)
Forwarding to lindsay to follow up on.

## 2016-02-07 NOTE — Telephone Encounter (Signed)
Per Danae Chen pt aware of below message. Nothing further needed.

## 2016-02-07 NOTE — Telephone Encounter (Signed)
Informed pt about forms completed and originals are ready for pickup.Amber Santiago

## 2016-02-07 NOTE — Telephone Encounter (Signed)
Forms have been signed by RB and faxed in. Originals have been placed up front for pt's husband to pick up per his request. lmtcb x1 for pt's husband.

## 2016-02-09 ENCOUNTER — Telehealth: Payer: Self-pay | Admitting: Emergency Medicine

## 2016-02-09 NOTE — Telephone Encounter (Signed)
Called and lmom to make the pt aware that the forms have been refaxed. Nothing further is needed.

## 2016-02-20 ENCOUNTER — Telehealth: Payer: Self-pay | Admitting: *Deleted

## 2016-02-20 NOTE — Telephone Encounter (Signed)
Oncology Nurse Navigator Documentation  Oncology Nurse Navigator Flowsheets 02/20/2016  Navigator Location [facility]  Navigator Encounter Type Telephone/patient's husband called.  He needed appt changed due to insurance change.  I called to change appt with Dr. Julien Nordmann. I will call to change CT appt  Telephone Outgoing Call  Barriers/Navigation Needs Coordination of Care  Interventions Coordination of Care  Coordination of Care Appts  Acuity Level 2  Acuity Level 2 Assistance expediting appointments  Time Spent with Patient 66

## 2016-02-23 ENCOUNTER — Telehealth: Payer: Self-pay | Admitting: Emergency Medicine

## 2016-02-23 NOTE — Telephone Encounter (Signed)
Spoke with the pts husband and he stated that they received the ventolin from the Pt assistance.  Nothing further is needed.

## 2016-02-24 ENCOUNTER — Other Ambulatory Visit: Payer: Managed Care, Other (non HMO)

## 2016-02-28 ENCOUNTER — Ambulatory Visit: Payer: Managed Care, Other (non HMO) | Admitting: Internal Medicine

## 2016-03-05 ENCOUNTER — Encounter (INDEPENDENT_AMBULATORY_CARE_PROVIDER_SITE_OTHER): Payer: Self-pay | Admitting: Ophthalmology

## 2016-03-06 ENCOUNTER — Telehealth: Payer: Self-pay | Admitting: *Deleted

## 2016-03-06 ENCOUNTER — Telehealth: Payer: Self-pay | Admitting: Oncology

## 2016-03-06 NOTE — Telephone Encounter (Signed)
Oncology Nurse Navigator Documentation  Oncology Nurse Navigator Flowsheets 03/06/2016  Navigator Encounter Type Telephone/Amber Santiago called and left me a vm message.  He has lost his job and needs resources to help pay for CT scan.  I called Amber Santiago back and explained that I was unaware of any resources.  I will follow up with FA here at the cancer center to see if they have any resources.    Telephone Outgoing Call  [employer] Needs Education  Education Other  Interventions Education  Education Method Verbal  Acuity Level 2  Acuity Level 2 Educational needs  Time Spent with Patient 15

## 2016-03-06 NOTE — Telephone Encounter (Signed)
[name] called and said he lost his job and now does not have Scientist, product/process development.  He said [facility] is due to have a PET scan but was told it could not be done because they do not have insurance.  He is wondering if there is anything that can be done.  Advised him that I would check and call him back.

## 2016-03-07 ENCOUNTER — Telehealth: Payer: Self-pay | Admitting: *Deleted

## 2016-03-07 ENCOUNTER — Telehealth: Payer: Self-pay | Admitting: Internal Medicine

## 2016-03-07 ENCOUNTER — Encounter (INDEPENDENT_AMBULATORY_CARE_PROVIDER_SITE_OTHER): Payer: Self-pay | Admitting: Ophthalmology

## 2016-03-07 DIAGNOSIS — H353231 Exudative age-related macular degeneration, bilateral, with active choroidal neovascularization: Secondary | ICD-10-CM

## 2016-03-07 DIAGNOSIS — H43813 Vitreous degeneration, bilateral: Secondary | ICD-10-CM

## 2016-03-07 NOTE — Telephone Encounter (Signed)
lvm to inform pt of r/s 12/11 appt to 04/17/16 per LOS. Called home number first but got disconnected. lvm on cell

## 2016-03-07 NOTE — Telephone Encounter (Signed)
Oncology Nurse Navigator Documentation  Oncology Nurse Navigator Flowsheets 03/07/2016  Navigator Encounter Type Telephone/I received a message from Mr. Glanz regarding questions about schedule changes.  He stated he received a message from scheduling but that he could not understand the message.  He asked that I call his wife's phone.  I called and left a message regarding appt change.  I asked that he call me if he had other questions   Telephone Outgoing Call  [employer] Needs Education  Education Other  Interventions Education  Education Method Verbal  Acuity Level 1  Time Spent with Patient 15

## 2016-03-09 ENCOUNTER — Encounter: Payer: Self-pay | Admitting: Internal Medicine

## 2016-03-09 NOTE — Progress Notes (Signed)
Patient's spouse returned my call from a message I received about financial concerns. He states she is currently uninsured and is unsure about how to proceed with her follow up care. Advised him that for being uninsured she would automatically receive a 55% discount for services billed by [facility]. Also advised if they would like to apply for [facility] FAA to see if she is eligible for any additional discounts through [facility], I could mail an application. He states that would be great. Advised him that all of the documents listed on the front sheet that apply to them would need to be submitted with the application and he could make copies or bring them in for me to do so and the address to submit is at the bottom of the application. Asked if they had applied for Medicaid and he said they were told with the amount of SS they receive, they were over the income. He states they were currently working on trying to get some type of coverage but would have to wait til 2018 without having to pay so much this late in the year. He also had a concern regarding scans, advised him that the billing for such services are done outside of [facility] and the discount would not apply and he would need to follow up with the imaging center where the services would be done to find out if they have any type of programs for uninsured patients and if scheduled would need to let the Pre-service/central scheduling center know when they call for payment his situation. Placed application in outgoing mail along with my card for any additional financial questions or concerns. He verbalized understanding.

## 2016-03-19 ENCOUNTER — Ambulatory Visit: Payer: Managed Care, Other (non HMO) | Admitting: Internal Medicine

## 2016-03-20 ENCOUNTER — Telehealth: Payer: Self-pay | Admitting: *Deleted

## 2016-03-20 NOTE — Telephone Encounter (Signed)
Oncology Nurse Navigator Documentation  Oncology Nurse Navigator Flowsheets 03/20/2016  Navigator Location [facility]  Navigator Encounter Type Telephone/I received a call from Mr. Gerdeman.  He stated he had a resource he found so his wife can get her CT Chest at a lower cost.  I called and was able to request patient to get Ct Chest.  I faxed order to [facility] and care management will contact patient to schedule.    Telephone Incoming Call  Treatment Phase Follow-up  Barriers/Navigation Needs Coordination of Care;Financial  Coordination of Care Radiology  Acuity Level 2  Acuity Level 2 Assistance expediting appointments  Time Spent with Patient 70

## 2016-03-22 ENCOUNTER — Encounter: Payer: Self-pay | Admitting: *Deleted

## 2016-03-22 ENCOUNTER — Telehealth: Payer: Self-pay | Admitting: *Deleted

## 2016-03-22 DIAGNOSIS — C3491 Malignant neoplasm of unspecified part of right bronchus or lung: Secondary | ICD-10-CM

## 2016-03-22 NOTE — Telephone Encounter (Signed)
Oncology Nurse Navigator Documentation  Oncology Nurse Navigator Flowsheets 03/22/2016  Navigator Location [facility]  Navigator Encounter Type Telephone/Amber Santiago called.  He stated his wife scan is set up for 03/23/16.  He asked about lab work.  I asked him to call facility regarding lab work.  He stated he would.   Telephone Outgoing Call  Treatment Phase Follow-up  Barriers/Navigation Needs Coordination of Care  Interventions Coordination of Care  Coordination of Care Radiology  Acuity Level 1  Acuity Level 1 Minimal follow up required  Time Spent with Patient 15

## 2016-03-22 NOTE — Progress Notes (Signed)
Mr. Difabio called back and states they do not do lab work.  I scheduled labs here at [facility] before her scan tomorrow.  He verbalized understanding of appt

## 2016-03-23 ENCOUNTER — Other Ambulatory Visit (HOSPITAL_BASED_OUTPATIENT_CLINIC_OR_DEPARTMENT_OTHER): Payer: Self-pay

## 2016-03-23 DIAGNOSIS — C3491 Malignant neoplasm of unspecified part of right bronchus or lung: Secondary | ICD-10-CM

## 2016-03-23 DIAGNOSIS — C3411 Malignant neoplasm of upper lobe, right bronchus or lung: Secondary | ICD-10-CM

## 2016-03-23 LAB — CBC WITH DIFFERENTIAL/PLATELET
BASO%: 0.5 % (ref 0.0–2.0)
BASOS ABS: 0 10*3/uL (ref 0.0–0.1)
EOS ABS: 0.1 10*3/uL (ref 0.0–0.5)
EOS%: 1.4 % (ref 0.0–7.0)
HCT: 39.4 % (ref 34.8–46.6)
HGB: 13.1 g/dL (ref 11.6–15.9)
LYMPH%: 8.2 % — AB (ref 14.0–49.7)
MCH: 29.4 pg (ref 25.1–34.0)
MCHC: 33.2 g/dL (ref 31.5–36.0)
MCV: 88.3 fL (ref 79.5–101.0)
MONO#: 0.5 10*3/uL (ref 0.1–0.9)
MONO%: 5.9 % (ref 0.0–14.0)
NEUT#: 6.6 10*3/uL — ABNORMAL HIGH (ref 1.5–6.5)
NEUT%: 84 % — AB (ref 38.4–76.8)
Platelets: 274 10*3/uL (ref 145–400)
RBC: 4.46 10*6/uL (ref 3.70–5.45)
RDW: 13.6 % (ref 11.2–14.5)
WBC: 7.8 10*3/uL (ref 3.9–10.3)
lymph#: 0.6 10*3/uL — ABNORMAL LOW (ref 0.9–3.3)

## 2016-03-23 LAB — COMPREHENSIVE METABOLIC PANEL
ALBUMIN: 3.2 g/dL — AB (ref 3.5–5.0)
ALK PHOS: 141 U/L (ref 40–150)
ALT: 13 U/L (ref 0–55)
AST: 26 U/L (ref 5–34)
Anion Gap: 9 mEq/L (ref 3–11)
BUN: 6.8 mg/dL — AB (ref 7.0–26.0)
CHLORIDE: 99 meq/L (ref 98–109)
CO2: 29 mEq/L (ref 22–29)
Calcium: 9.4 mg/dL (ref 8.4–10.4)
Creatinine: 0.6 mg/dL (ref 0.6–1.1)
EGFR: 90 mL/min/{1.73_m2} — AB (ref >90)
GLUCOSE: 114 mg/dL (ref 70–140)
POTASSIUM: 4.3 meq/L (ref 3.5–5.1)
SODIUM: 136 meq/L (ref 136–145)
Total Bilirubin: 0.31 mg/dL (ref 0.20–1.20)
Total Protein: 7.1 g/dL (ref 6.4–8.3)

## 2016-03-23 NOTE — Progress Notes (Signed)
Counseling intern met briefly with patient and spouse in the [place] lobby. The two expressed concerns about feeling disconnected and unsupported once the patient's radiation treatment was completed, though they have been very pleased with the level of care they have received during active treatment. The couple also described external stressors that have exacerbated their experience. Counselor is sending a packet of information listing the resources available in the [facility] and [facility] and will follow up with the family in January to offer counseling support.  Linkon Siverson A. Philbert Riser, Counseling Intern Department for Spiritual Care and [employer] Supervisor - Scientist, research (physical sciences)

## 2016-03-28 ENCOUNTER — Telehealth: Payer: Self-pay | Admitting: *Deleted

## 2016-03-28 NOTE — Telephone Encounter (Signed)
Oncology Nurse Navigator Documentation  Oncology Nurse Navigator Flowsheets 03/28/2016  Navigator Location [facility]  Navigator Encounter Type Telephone/Mr. Haffey called wanting scan results.  I updated Dr. Julien Nordmann.  He stated update him that there are some small changes and he will discuss further on 04/17/16.  I updated Mr. Venturi and he was thankful for the call.   Telephone Outgoing Call  Treatment Phase Follow-up  Barriers/Navigation Needs Education  Education Other  Interventions Education  Education Method Verbal  Acuity Level 2  Acuity Level 2 Educational needs  Time Spent with Patient 30

## 2016-03-29 ENCOUNTER — Encounter (INDEPENDENT_AMBULATORY_CARE_PROVIDER_SITE_OTHER): Payer: Self-pay | Admitting: Ophthalmology

## 2016-03-29 DIAGNOSIS — H353211 Exudative age-related macular degeneration, right eye, with active choroidal neovascularization: Secondary | ICD-10-CM

## 2016-03-29 DIAGNOSIS — H43813 Vitreous degeneration, bilateral: Secondary | ICD-10-CM

## 2016-03-29 DIAGNOSIS — H353122 Nonexudative age-related macular degeneration, left eye, intermediate dry stage: Secondary | ICD-10-CM

## 2016-04-12 ENCOUNTER — Ambulatory Visit: Payer: Managed Care, Other (non HMO) | Admitting: Internal Medicine

## 2016-04-16 ENCOUNTER — Telehealth: Payer: Self-pay | Admitting: Emergency Medicine

## 2016-04-16 MED ORDER — ALBUTEROL SULFATE HFA 108 (90 BASE) MCG/ACT IN AERS: 2.0000 | INHALATION_SPRAY | Freq: Four times a day (QID) | RESPIRATORY_TRACT | 3 refills | Status: AC | PRN

## 2016-04-16 NOTE — Telephone Encounter (Signed)
Spoke with pt's spouse (pt gave verbal to speak to spouse) Pt's spouse states pt is currently getting pt assistance for symbicort and ventolin. Pt is wanting to switch to proair, as pt doesn't notice any difference with ventolin. Pt feels ventolin is causing a increased cough. Pt's spouse states he has already reached out to pt assistance for a proair application. Pt spouse request that we send a Rx for proair to preferred pharmacy. Rx has been sent to preferred pharmacy. Pt's spouse states once he has received proair application, he will contact us. Pt's spouse also requested samples of proair, I have made him aware that we do not currently have samples of proair.  nothing further needed.

## 2016-04-17 ENCOUNTER — Telehealth: Payer: Self-pay | Admitting: Internal Medicine

## 2016-04-17 ENCOUNTER — Encounter: Payer: Self-pay | Admitting: Internal Medicine

## 2016-04-17 ENCOUNTER — Ambulatory Visit (HOSPITAL_BASED_OUTPATIENT_CLINIC_OR_DEPARTMENT_OTHER): Payer: Self-pay | Admitting: Internal Medicine

## 2016-04-17 VITALS — BP 152/92 | HR 90 | Temp 97.6°F | Resp 18 | Ht 64.0 in | Wt 116.1 lb

## 2016-04-17 DIAGNOSIS — G8929 Other chronic pain: Secondary | ICD-10-CM

## 2016-04-17 DIAGNOSIS — J449 Chronic obstructive pulmonary disease, unspecified: Secondary | ICD-10-CM

## 2016-04-17 DIAGNOSIS — C3411 Malignant neoplasm of upper lobe, right bronchus or lung: Secondary | ICD-10-CM

## 2016-04-17 DIAGNOSIS — M4854XA Collapsed vertebra, not elsewhere classified, thoracic region, initial encounter for fracture: Secondary | ICD-10-CM

## 2016-04-17 DIAGNOSIS — C3491 Malignant neoplasm of unspecified part of right bronchus or lung: Secondary | ICD-10-CM

## 2016-04-17 DIAGNOSIS — R5383 Other fatigue: Secondary | ICD-10-CM

## 2016-04-17 MED ORDER — PREDNISONE 10 MG PO TABS: ORAL_TABLET | ORAL | 0 refills | Status: DC

## 2016-04-17 NOTE — Telephone Encounter (Signed)
Appointments scheduled per 1/9 LOS. Patient given AVS report and calendars with future scheduled appointments. Patient stated that labs would be drawn at [facility].

## 2016-04-17 NOTE — Progress Notes (Signed)
Counselor spoke briefly with patient and husband in lobby area. Both expressed appreciation for the [employer] counselor sent in December. Patient indicated she plans to attend the yoga classes as a gentle way to get more exercise and improve her mobility. Counselor will continue to follow up with patient and husband as schedule permits.  Lamount Cohen, Counseling Intern Department for Spiritual Care and [facility] Supervisor - Scientist, research (physical sciences)

## 2016-04-17 NOTE — Progress Notes (Signed)
[place] [phone]   [phone]  OFFICE PROGRESS NOTE  Pcp Not In System No address on file  DIAGNOSIS: Stage IIB (T3, N0, M0) non-small cell lung cancer, squamous cell carcinoma diagnosed in December 2016 and presented with large right upper lobe lung mass with satellite nodule.  PRIOR THERAPY: Definitive radiotherapy under the care of Dr. Sondra Come for a total dose of 64 gray completed 06/17/2015.  CURRENT THERAPY: Observation.  INTERVAL HISTORY: Amber Santiago 71 y.o. female came to the clinic today for follow-up visit accompanied by her husband. The patient came several complaints including weight loss of around 9 pounds since her last visit. She also has swelling of the lower extremities. She continues to have shortness of breath at baseline and increased with exertion. She has not seen her pulmonologist for more than a year. The patient also has chronic back pain and she was seen by a chiropractor several times recently. She does not take any narcotic for her pain management but she takes Tylenol and Advil as needed. She also has a lot of anxiety and her blood pressure was elevated today. She denied having any nausea or vomiting. She has no fever or chills. She denied having any headache or visual changes. She had repeat CT scan of the chest performed at [facility] recently and she is here for evaluation and discussion of her scan results.   MEDICAL HISTORY: Past Medical History:  Diagnosis Date  . 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 Arthritis   . Asthma   . Cancer ([place])    right lung  . Constipation   . COPD (chronic obstructive pulmonary disease) ([place])   . Hypertension   . Hyperthyroidism    PMH  . Lung nodule   . Non-traumatic compression fracture of T6 thoracic vertebra ([place]) 11/28/2015  . Pneumonia   . Radiation 05/05/15-06/17/15   right central chest area 64 gray  . Umbilical hernia     ALLERGIES:  is  allergic to amoxicillin and penicillins.  MEDICATIONS:  Current Outpatient Prescriptions  Medication Sig Dispense Refill  . Acetaminophen (TYLENOL) 325 MG CAPS Take 500 mg by mouth every 6 (six) hours as needed. Reported on 08/04/2015    . albuterol (PROAIR HFA) 108 (90 Base) MCG/ACT inhaler Inhale 2 puffs into the lungs every 6 (six) hours as needed for wheezing or shortness of breath. 1 Inhaler 3  . albuterol (PROVENTIL HFA;VENTOLIN HFA) 108 (90 Base) MCG/ACT inhaler Inhale 2 puffs into the lungs every 6 (six) hours as needed for wheezing or shortness of breath. 3 Inhaler 3  . budesonide-formoterol (SYMBICORT) 160-4.5 MCG/ACT inhaler INHALE TWO PUFFS INTO LUNGS TWICE DAILY 11 g 11  . budesonide-formoterol (SYMBICORT) 160-4.5 MCG/ACT inhaler Inhale 2 puffs into the lungs 2 (two) times daily. 1 Inhaler 0  . Cyanocobalamin (B-12 PO) Take 1 tablet by mouth daily. Reported on 08/04/2015    . dorzolamide (TRUSOPT) 2 % ophthalmic solution Place 1 drop into both eyes 2 (two) times daily.    . Multiple Vitamins-Minerals (PRESERVISION AREDS 2 PO) Take 1 capsule by mouth daily. Reported on 07/05/2015    . Pyridoxine HCl (B-6 PO) Take 1 tablet by mouth daily. Reported on 08/04/2015    . Spacer/Aero-Holding Chambers (AEROCHAMBER MV) inhaler Use as instructed 1 each 0   No current facility-administered medications for this visit.     SURGICAL HISTORY:  Past Surgical History:  Procedure Laterality Date  . APPENDECTOMY    .  DILATION AND CURETTAGE OF UTERUS    . MEDIASTINOSCOPY N/A 04/12/2015   Procedure: MEDIASTINOSCOPY;  Surgeon: Grace Isaac, MD;  Location: [place];  Service: Thoracic;  Laterality: N/A;  . VIDEO BRONCHOSCOPY Bilateral 03/18/2015   Procedure: VIDEO BRONCHOSCOPY WITH FLUORO;  Surgeon: Collene Gobble, MD;  Location: [place];  Service: Cardiopulmonary;  Laterality: Bilateral;  . VIDEO BRONCHOSCOPY WITH ENDOBRONCHIAL ULTRASOUND N/A 04/12/2015   Procedure: VIDEO BRONCHOSCOPY WITH  ENDOBRONCHIAL ULTRASOUND;  Surgeon: Grace Isaac, MD;  Location: [facility];  Service: Thoracic;  Laterality: N/A;    REVIEW OF SYSTEMS:  Constitutional: positive for fatigue and weight loss Eyes: negative Ears, nose, mouth, throat, and face: negative Respiratory: positive for dyspnea on exertion and wheezing Cardiovascular: negative Gastrointestinal: negative Genitourinary:negative Integument/breast: negative Hematologic/lymphatic: negative Musculoskeletal:positive for back pain Neurological: negative Behavioral/Psych: negative Endocrine: negative Allergic/Immunologic: negative   PHYSICAL EXAMINATION: General appearance: alert, cooperative, fatigued and no distress Head: Normocephalic, without obvious abnormality, atraumatic Neck: no adenopathy, no JVD, supple, symmetrical, trachea midline and thyroid not enlarged, symmetric, no tenderness/mass/nodules Lymph nodes: Cervical, supraclavicular, and axillary nodes normal. Resp: wheezes bilaterally Back: symmetric, no curvature. ROM normal. No CVA tenderness. Cardio: regular rate and rhythm, S1, S2 normal, no murmur, click, rub or gallop GI: soft, non-tender; bowel sounds normal; no masses,  no organomegaly Extremities: extremities normal, atraumatic, no cyanosis or edema Neurologic: Alert and oriented X 3, normal strength and tone. Normal symmetric reflexes. Normal coordination and gait  ECOG PERFORMANCE STATUS: 1 - Symptomatic but completely ambulatory  Blood pressure (!) 152/92, pulse 90, temperature 97.6 F (36.4 C), temperature source Oral, resp. rate 18, height '5\' 4"'$  (1.626 m), weight 116 lb 1.6 oz (52.7 kg), SpO2 99 %.  LABORATORY DATA: Lab Results  Component Value Date   WBC 7.8 03/23/2016   HGB 13.1 03/23/2016   HCT 39.4 03/23/2016   MCV 88.3 03/23/2016   PLT 274 03/23/2016      Chemistry      Component Value Date/Time   NA 136 03/23/2016 1114   K 4.3 03/23/2016 1114   CL 99 (L) 04/06/2015 1242   CO2 29  03/23/2016 1114   BUN 6.8 (L) 03/23/2016 1114   CREATININE 0.6 03/23/2016 1114      Component Value Date/Time   CALCIUM 9.4 03/23/2016 1114   ALKPHOS 141 03/23/2016 1114   AST 26 03/23/2016 1114   ALT 13 03/23/2016 1114   BILITOT 0.31 03/23/2016 1114       RADIOGRAPHIC STUDIES: No results found. 1.Masslike opacity in the right upper lobe. There is a reported history of radiation therapy. Evaluation for interval change cannot be performed without comparison studies.  2.Mildly enlarged mediastinal and right hilar lymph nodes. (enlarged right superior paratracheal node measuring 1.6 x 1 cm. Scattered subcentimeter mediastinal nodes more inferiorly. Small 1.2 x 0.9 cm right hilar node Again, comparison with prior imaging is necessary.  3.Small left lung pulmonary nodules.  4.Age-indeterminate T6 compression fracture. There is also a nondisplaced fracture through the T5 spinous process.    ASSESSMENT AND PLAN:  This is a very pleasant 70 years old white female with multiple medical issues including history of stage IIB non-small cell lung cancer, squamous cell carcinoma status post curative radiotherapy. She has been on observation for more than a year. Her last CT scan of the chest was performed at [facility] based on the husband's request because of some cost issues. Unfortunately was not compared to her previous scan but the CT scan showed mild  enlargement mediastinal and right hilar lymph nodes. Comparing these results to her previous scan there is some concern about slowly enlarging right hilar and mediastinal lymphadenopathy. I recommended for the patient to continue on observation for now but we will repeat another CT scan of the chest in 3 months for further evaluation of these lymph nodes. For the weight loss and shortness of breath, I started the patient on a tapering dose of prednisone 20 mg by mouth daily for 2 weeks followed by 10 mg by mouth daily for 2 weeks  followed by 5 mg by mouth daily for 1 week followed by 5 mg by mouth daily every other day for one week and then the patient will discontinue the prednisone. I also recommended for her to see Dr. Lamonte Sakai for evaluation of her COPD. For the chronic back pain, she may benefit from the treatment with prednisone but she will continue her current management by the chiropractor. There is no clear evidence of metastatic bone disease on the recent scan. The patient would come back for follow-up visit in 3 months for reevaluation. She was advised to call immediately if she has any concerning symptoms in the interval. I gave the patient and her husband the time to ask of breast induration one half a lengthy discussion about her condition. The patient voices understanding of current disease status and treatment options and is in agreement with the current care plan.  All questions were answered. The patient knows to call the clinic with any problems, questions or concerns. We can certainly see the patient much sooner if necessary. I spent 15 minutes counseling the patient face to face. The total time spent in the appointment was 25 minutes. Disclaimer: This note was dictated with voice recognition software. Similar sounding words can inadvertently be transcribed and may not be corrected upon review.

## 2016-04-18 NOTE — Progress Notes (Unsigned)
Counseling intern returned called to patient who inquired about valet parking during the evening, as she planned to attend a yoga class offered through the [facility].

## 2016-04-26 ENCOUNTER — Encounter (INDEPENDENT_AMBULATORY_CARE_PROVIDER_SITE_OTHER): Payer: Self-pay | Admitting: Ophthalmology

## 2016-04-27 ENCOUNTER — Encounter (INDEPENDENT_AMBULATORY_CARE_PROVIDER_SITE_OTHER): Payer: Self-pay | Admitting: Ophthalmology

## 2016-04-27 DIAGNOSIS — H353231 Exudative age-related macular degeneration, bilateral, with active choroidal neovascularization: Secondary | ICD-10-CM

## 2016-04-27 DIAGNOSIS — H34211 Partial retinal artery occlusion, right eye: Secondary | ICD-10-CM

## 2016-04-27 DIAGNOSIS — H43813 Vitreous degeneration, bilateral: Secondary | ICD-10-CM

## 2016-05-02 ENCOUNTER — Telehealth: Payer: Self-pay | Admitting: Emergency Medicine

## 2016-05-02 NOTE — Telephone Encounter (Addendum)
Spoke with pt's husband. He is aware that we have received this form. There is a portion of the form the they need to fill out before it can be faxed to the company. [name] states that they will come by on Friday to complete this. Our portion of the form has been filled out and signed by RB. This form will be placed in RB's "sign folder."

## 2016-05-09 NOTE — Telephone Encounter (Signed)
This form has been signed by RB. We are waiting on the pt and her husband to fill out their portion of this form. lmtcb x1 for the pt's husband to find out when they would be able to come by and fill this out.

## 2016-05-09 NOTE — Telephone Encounter (Signed)
Ria Comment any update on this form?  Has RB signed this form?  thanks

## 2016-05-10 NOTE — Telephone Encounter (Signed)
lmtcb x2 for pt. 

## 2016-05-10 NOTE — Telephone Encounter (Signed)
Spoke with pt. She states that her husband is currently admitted to the hospital. Advised her that I would continue to hold on her patient assistance forms and they come by at their convenience to fill it out. Message will be closed at this time due to the pt not knowing when they will be able to come. Pt's forms are still in my possession, they are in the folder labeled "Lindsay's To Do" located in RB's look at [employer]bie.

## 2016-05-23 ENCOUNTER — Encounter (INDEPENDENT_AMBULATORY_CARE_PROVIDER_SITE_OTHER): Payer: Self-pay | Admitting: Ophthalmology

## 2016-05-23 DIAGNOSIS — H34211 Partial retinal artery occlusion, right eye: Secondary | ICD-10-CM

## 2016-05-23 DIAGNOSIS — H2513 Age-related nuclear cataract, bilateral: Secondary | ICD-10-CM

## 2016-05-23 DIAGNOSIS — H43813 Vitreous degeneration, bilateral: Secondary | ICD-10-CM

## 2016-05-23 DIAGNOSIS — H353231 Exudative age-related macular degeneration, bilateral, with active choroidal neovascularization: Secondary | ICD-10-CM

## 2016-05-25 ENCOUNTER — Telehealth: Payer: Self-pay | Admitting: *Deleted

## 2016-05-25 NOTE — Telephone Encounter (Signed)
Oncology Nurse Navigator Documentation  Oncology Nurse Navigator Flowsheets 05/25/2016  Navigator Location [facility]  Navigator Encounter Type Telephone/I received 2 vm message from Amber Santiago regarding scans.  I called her today but was unable to reach her. I left vm message to call.   Telephone Outgoing Call  Treatment Phase Follow-up  Barriers/Navigation Needs No barriers at this time  Acuity Level 1  Time Spent with Patient 15

## 2016-05-25 NOTE — Telephone Encounter (Signed)
Oncology Nurse Navigator Documentation  Oncology Nurse Navigator Flowsheets 05/25/2016  Navigator Location [facility]  Navigator Encounter Type Telephone/Ms. Amber Santiago daughter called me back to explain what Amber Santiago needed.  Apparently, her ct was not given to her at [facility] imaging here in [place] and she needed me to help get the scan.  I called [facility] in [place] and they will send the CD of scan to me here at the cancer center.  I called the Dechaine's back and updated.   Telephone Incoming Call;Outgoing Call  Treatment Phase Follow-up  Barriers/Navigation Needs Coordination of Care  Interventions Coordination of Care  Coordination of Care Other  Acuity Level 2  Time Spent with Patient 60

## 2016-06-15 ENCOUNTER — Telehealth: Payer: Self-pay | Admitting: Emergency Medicine

## 2016-06-15 NOTE — Telephone Encounter (Signed)
Spoke with pt, requesting proair patient assistance forms to be mailed to her as her husband recently had a stroke and she is unable to come by the office to sign them.  Forms mailed to verified address on file.  Nothing further needed.

## 2016-06-18 ENCOUNTER — Ambulatory Visit: Payer: Self-pay | Attending: Family Medicine | Admitting: Family Medicine

## 2016-06-18 ENCOUNTER — Encounter: Payer: Self-pay | Admitting: Family Medicine

## 2016-06-18 VITALS — BP 169/92 | HR 82 | Temp 97.4°F | Ht 64.0 in | Wt 114.2 lb

## 2016-06-18 DIAGNOSIS — M19041 Primary osteoarthritis, right hand: Secondary | ICD-10-CM | POA: Insufficient documentation

## 2016-06-18 DIAGNOSIS — M1990 Unspecified osteoarthritis, unspecified site: Secondary | ICD-10-CM | POA: Insufficient documentation

## 2016-06-18 DIAGNOSIS — M19042 Primary osteoarthritis, left hand: Secondary | ICD-10-CM | POA: Insufficient documentation

## 2016-06-18 DIAGNOSIS — J449 Chronic obstructive pulmonary disease, unspecified: Secondary | ICD-10-CM | POA: Insufficient documentation

## 2016-06-18 DIAGNOSIS — Z79899 Other long term (current) drug therapy: Secondary | ICD-10-CM | POA: Insufficient documentation

## 2016-06-18 DIAGNOSIS — Z880 Allergy status to penicillin: Secondary | ICD-10-CM | POA: Insufficient documentation

## 2016-06-18 DIAGNOSIS — R600 Localized edema: Secondary | ICD-10-CM | POA: Insufficient documentation

## 2016-06-18 DIAGNOSIS — F419 Anxiety disorder, unspecified: Secondary | ICD-10-CM | POA: Insufficient documentation

## 2016-06-18 DIAGNOSIS — Z923 Personal history of irradiation: Secondary | ICD-10-CM | POA: Insufficient documentation

## 2016-06-18 DIAGNOSIS — I10 Essential (primary) hypertension: Secondary | ICD-10-CM | POA: Insufficient documentation

## 2016-06-18 DIAGNOSIS — C3491 Malignant neoplasm of unspecified part of right bronchus or lung: Secondary | ICD-10-CM | POA: Insufficient documentation

## 2016-06-18 MED ORDER — NAPROXEN 500 MG PO TABS
500.0000 mg | ORAL_TABLET | Freq: Two times a day (BID) | ORAL | 2 refills | Status: DC
Start: 2016-06-18 — End: 2016-08-31

## 2016-06-18 MED ORDER — LISINOPRIL-HYDROCHLOROTHIAZIDE 20-25 MG PO TABS: 1.0000 | ORAL_TABLET | Freq: Every day | ORAL | 3 refills | Status: DC

## 2016-06-18 MED ORDER — HYDROXYZINE HCL 25 MG PO TABS: 25.0000 mg | ORAL_TABLET | Freq: Three times a day (TID) | ORAL | 1 refills | Status: DC | PRN

## 2016-06-18 MED FILL — NAPROXEN 500 MG TABLET: 500 | 30 days supply | Qty: 60 | Fill #0

## 2016-06-18 MED FILL — LISINOPRIL-HCTZ 20-25 MG TA: 20-25 | 30 days supply | Qty: 30 | Fill #0

## 2016-06-18 MED FILL — ?HYDROXYZINE HCL 25 MG TAB: 25 MG | 30 days supply | Qty: 90 | Fill #0

## 2016-06-18 NOTE — Patient Instructions (Signed)
Arthritis Arthritis means joint pain. It can also mean joint disease. A joint is a place where bones come together. People who have arthritis may have:  Red joints.  Swollen joints.  Stiff joints.  Warm joints.  A fever.  A feeling of being sick. Follow these instructions at home: Pay attention to any changes in your symptoms. Take these actions to help with your pain and swelling. Medicines   Take over-the-counter and prescription medicines only as told by your doctor.  Do not take aspirin for pain if your doctor says that you may have gout. Activity   Rest your joint if your doctor tells you to.  Avoid activities that make the pain worse.  Exercise your joint regularly as told by your doctor. Try doing exercises like:  Swimming.  Water aerobics.  Biking.  Walking. Joint Care    If your joint is swollen, keep it raised (elevated) if told by your doctor.  If your joint feels stiff in the morning, try taking a warm shower.  If you have diabetes, do not apply heat without asking your doctor.  If told, apply heat to the joint:  Put a towel between the joint and the hot pack or heating pad.  Leave the heat on the area for 20-30 minutes.  If told, apply ice to the joint:  Put ice in a plastic bag.  Place a towel between your skin and the bag.  Leave the ice on for 20 minutes, 2-3 times per day.  Keep all follow-up visits as told by your doctor. Contact a doctor if:  The pain gets worse.  You have a fever. Get help right away if:  You have very bad pain in your joint.  You have swelling in your joint.  Your joint is red.  Many joints become painful and swollen.  You have very bad back pain.  Your leg is very weak.  You cannot control your pee (urine) or poop (stool). This information is not intended to replace advice given to you by your health care provider. Make sure you discuss any questions you have with your health care  provider. Document Released: 06/20/2009 Document Revised: 09/01/2015 Document Reviewed: 06/21/2014 Elsevier Interactive Patient Education  2017 [employer]  

## 2016-06-18 NOTE — Progress Notes (Signed)
Subjective:  Patient ID: Amber Santiago, female    DOB: [birth year]  Age: 71 y.o. MRN: [id number]  CC: Lung Cancer; COPD; Back Pain; Edema (feet and wrists); Shortness of Breath; Weight Loss; and Anorexia   HPI Amber Santiago is a 71 year old female with a history of COPD, stage IIB (T3, N0, M0) non-small cell lung cancer, squamous cell carcinoma diagnosed in 2016 status post radiation (which she completed in 06/2015) who presents today to establish care. She is under the the care of oncology at the cancer center with her last visit to Dr. Earlie Server in 04/2016 and her pulmonologist is Dr. Lamonte Sakai.  She complains of shortness of breath and is is driven by her getting anxious; she gets a lot of anxiety and then notices increase in high respiratory rate. At her visit with oncology and she was placed on prednisone taper which she never took but discarded.  Complains of pedal edema which is chronic but has worsened over the past few months, denies orthopnea. She has also lost her appetite has noticed weight loss. She complains of swelling of the fingers of the hands which alternate between left and right hand and this morning her left is swollen with pain and swelling worse on waking up  Past Medical History:  Diagnosis Date  . 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 Arthritis   . Asthma   . Cancer ([place])    right lung  . Constipation   . COPD (chronic obstructive pulmonary disease) ([place])   . Hypertension   . Hyperthyroidism    PMH  . Lung nodule   . Non-traumatic compression fracture of T6 thoracic vertebra ([place]) 11/28/2015  . Pneumonia   . Radiation 05/05/15-06/17/15   right central chest area 64 gray  . Umbilical hernia     Past Surgical History:  Procedure Laterality Date  . APPENDECTOMY    . DILATION AND CURETTAGE OF UTERUS    . MEDIASTINOSCOPY N/A 04/12/2015   Procedure: MEDIASTINOSCOPY;  Surgeon: Grace Isaac, MD;  Location: [facility];  Service: Thoracic;  Laterality:  N/A;  . VIDEO BRONCHOSCOPY Bilateral 03/18/2015   Procedure: VIDEO BRONCHOSCOPY WITH FLUORO;  Surgeon: Collene Gobble, MD;  Location: [facility];  Service: Cardiopulmonary;  Laterality: Bilateral;  . VIDEO BRONCHOSCOPY WITH ENDOBRONCHIAL ULTRASOUND N/A 04/12/2015   Procedure: VIDEO BRONCHOSCOPY WITH ENDOBRONCHIAL ULTRASOUND;  Surgeon: Grace Isaac, MD;  Location: [facility];  Service: Thoracic;  Laterality: N/A;    Allergies  Allergen Reactions  . Amoxicillin Anaphylaxis  . Penicillins Anaphylaxis    Has patient had a PCN reaction causing immediate rash, facial/tongue/throat swelling, SOB or lightheadedness with hypotension: yes Has patient had a PCN reaction causing severe rash involving mucus membranes or skin necrosis: unknown Has patient had a PCN reaction that required hospitalization: office visist Has patient had a PCN reaction occurring within the last 10 years: no If all of the above answers are "NO", then may proceed with Cephalosporin use.      Outpatient Medications Prior to Visit  Medication Sig Dispense Refill  . Acetaminophen (TYLENOL) 325 MG CAPS Take 500 mg by mouth every 6 (six) hours as needed. Reported on 08/04/2015    . albuterol (PROAIR HFA) 108 (90 Base) MCG/ACT inhaler Inhale 2 puffs into the lungs every 6 (six) hours as needed for wheezing or shortness of breath. 1 Inhaler 3  . albuterol (PROVENTIL HFA;VENTOLIN HFA) 108 (90 Base) MCG/ACT inhaler Inhale 2 puffs into the lungs every 6 (six) hours as needed  for wheezing or shortness of breath. 3 Inhaler 3  . budesonide-formoterol (SYMBICORT) 160-4.5 MCG/ACT inhaler INHALE TWO PUFFS INTO LUNGS TWICE DAILY 11 g 11  . Cyanocobalamin (B-12 PO) Take 1 tablet by mouth daily. Reported on 08/04/2015    . dorzolamide (TRUSOPT) 2 % ophthalmic solution Place 1 drop into both eyes 2 (two) times daily.    . Multiple Vitamins-Minerals (PRESERVISION AREDS 2 PO) Take 1 capsule by mouth daily. Reported on 07/05/2015    . Pyridoxine HCl  (B-6 PO) Take 1 tablet by mouth daily. Reported on 08/04/2015    . Spacer/Aero-Holding Chambers (AEROCHAMBER MV) inhaler Use as instructed (Patient not taking: Reported on 06/18/2016) 1 each 0  . budesonide-formoterol (SYMBICORT) 160-4.5 MCG/ACT inhaler Inhale 2 puffs into the lungs 2 (two) times daily. 1 Inhaler 0  . predniSONE (DELTASONE) 10 MG tablet One tablet twice a day for 2 weeks, 1 tablet once a day for 2 weeks, half a tablet once a day for 1 week then half a tablet every other day for one week then stop. 50 tablet 0   No facility-administered medications prior to visit.     ROS Review of Systems  Constitutional: Positive for activity change and unexpected weight change. Negative for appetite change and fatigue.  HENT: Negative for congestion, sinus pressure and sore throat.   Eyes: Negative for visual disturbance.  Respiratory: Positive for shortness of breath. Negative for cough, chest tightness and wheezing.   Cardiovascular: Positive for leg swelling. Negative for chest pain and palpitations.  Gastrointestinal: Negative for abdominal distention, abdominal pain and constipation.  Endocrine: Negative for polydipsia.  Genitourinary: Negative for dysuria and frequency.  Musculoskeletal:       See hpi  Skin: Negative for rash.  Neurological: Negative for tremors, light-headedness and numbness.  Hematological: Does not bruise/bleed easily.  Psychiatric/Behavioral: Negative for agitation and behavioral problems.       Positive for anxiety    Objective:  BP (!) 169/92 (BP Location: Right Arm, Patient Position: Sitting, Cuff Size: Small)   Pulse 82   Temp 97.4 F (36.3 C) (Oral)   Ht '5\' 4"'$  (1.626 m)   Wt 114 lb 3.2 oz (51.8 kg)   SpO2 96%   BMI 19.60 kg/m   BP/Weight 06/18/2016 04/17/2016 07/26/3788  Systolic BP 240 973 532  Diastolic BP 92 92 74  Wt. (Lbs) 114.2 116.1 125.3  BMI 19.6 19.93 21.51      Physical Exam  Constitutional: She is oriented to person, place, and  time. She appears well-developed and well-nourished.  Cardiovascular: Normal rate, normal heart sounds and intact distal pulses.   No murmur heard. Pulmonary/Chest: Effort normal and breath sounds normal. She has no wheezes. She has no rales. She exhibits no tenderness.  Abdominal: Soft. Bowel sounds are normal. She exhibits no distension and no mass. There is no tenderness.  Musculoskeletal: Normal range of motion. She exhibits edema (3+ bilateral dorsal edema).  Edema of MCP joints of the left hand and associated tenderness and range of motion. Unable to make a fist in both hands.  Neurological: She is alert and oriented to person, place, and time.  Skin: Skin is warm and dry.  Psychiatric: She has a normal mood and affect.     Assessment & Plan:   1. Squamous cell carcinoma of right lung (HCC) Status post radiation Weight loss could be secondary to this and also due to poor appetite Followed by oncology Repeat CT scan of the chest in 07/2016  2. Chronic obstructive pulmonary disease, unspecified COPD type (HCC) Stable No exacerbation  3. Essential hypertension Blood pressure has been elevated at the previous office visits in today I will commence on antihypertensive - lisinopril-hydrochlorothiazide (PRINZIDE,ZESTORETIC) 20-25 MG tablet; Take 1 tablet by mouth daily.  Dispense: 30 tablet; Refill: 3  4. Primary osteoarthritis of both hands - naproxen (NAPROSYN) 500 MG tablet; Take 1 tablet (500 mg total) by mouth 2 (two) times daily with a meal.  Dispense: 60 tablet; Refill: 2  5. Anxiety If symptoms persist place on SSRI Choice of hydroxyzine as this will help with both anxiety and insomnia - hydrOXYzine (ATARAX/VISTARIL) 25 MG tablet; Take 1 tablet (25 mg total) by mouth 3 (three) times daily as needed.  Dispense: 90 tablet; Refill: 1  6. Pedal edema Chronic but worsening of recent Hopefully initiation of hydrochlorothiazide will help with this Elevate feet Low-sodium  diet Compression stockings  Meds ordered this encounter  Medications  . hydrOXYzine (ATARAX/VISTARIL) 25 MG tablet    Sig: Take 1 tablet (25 mg total) by mouth 3 (three) times daily as needed.    Dispense:  90 tablet    Refill:  1  . naproxen (NAPROSYN) 500 MG tablet    Sig: Take 1 tablet (500 mg total) by mouth 2 (two) times daily with a meal.    Dispense:  60 tablet    Refill:  2  . lisinopril-hydrochlorothiazide (PRINZIDE,ZESTORETIC) 20-25 MG tablet    Sig: Take 1 tablet by mouth daily.    Dispense:  30 tablet    Refill:  3    Follow-up: Return in about 3 weeks (around 07/09/2016) for Follow-up on hypertension.   Arnoldo Morale MD

## 2016-06-19 DIAGNOSIS — R600 Localized edema: Secondary | ICD-10-CM | POA: Insufficient documentation

## 2016-06-20 ENCOUNTER — Encounter (INDEPENDENT_AMBULATORY_CARE_PROVIDER_SITE_OTHER): Payer: Self-pay | Admitting: Ophthalmology

## 2016-06-21 ENCOUNTER — Encounter (INDEPENDENT_AMBULATORY_CARE_PROVIDER_SITE_OTHER): Payer: Self-pay | Admitting: Ophthalmology

## 2016-06-21 DIAGNOSIS — H2513 Age-related nuclear cataract, bilateral: Secondary | ICD-10-CM

## 2016-06-21 DIAGNOSIS — H353231 Exudative age-related macular degeneration, bilateral, with active choroidal neovascularization: Secondary | ICD-10-CM

## 2016-06-21 DIAGNOSIS — H43813 Vitreous degeneration, bilateral: Secondary | ICD-10-CM

## 2016-06-26 ENCOUNTER — Telehealth: Payer: Self-pay | Admitting: Nutrition

## 2016-06-26 NOTE — Telephone Encounter (Signed)
Contacted patient by telephone. She would like to have information for gaining weight. States she has had ongoing weight loss. She currently weighs 114 pounds. She eats 3 small meals a day. She is trying to add snacks throughout the day.  She finds this difficult. She is drinking a grocery store protein drink.  Educated patient on strategies for increasing calories and protein. Recommended patient try Ensure Plus or boost plus for additional calories and protein. I will mail fact sheets and coupons along with contact information to patient.

## 2016-06-29 ENCOUNTER — Telehealth: Payer: Self-pay | Admitting: *Deleted

## 2016-06-29 NOTE — Telephone Encounter (Signed)
Oncology Nurse Navigator Documentation  Oncology Nurse Navigator Flowsheets 06/29/2016  Navigator Location [facility]  Navigator Encounter Type Telephone/I received a call from Amber Santiago regarding her not being able to pay for CT scan.  I contacted the agency that helped her last time.  They stated Amber Santiago has an out of pocket expense.  I contacted Amber Santiago to update her. I updated her and listened as she explained her frustration.  I was sorry to hear about her frustration.  I encouraged her to keep her appt with FA on 3/26 to see if they could help her.   Treatment Phase Other  Barriers/Navigation Needs Financial  Interventions Other  Acuity Level 2  Acuity Level 2 Other  Time Spent with Patient 30

## 2016-07-02 ENCOUNTER — Ambulatory Visit: Payer: Medicare Other | Attending: Family Medicine

## 2016-07-03 ENCOUNTER — Ambulatory Visit: Payer: Self-pay | Admitting: Family Medicine

## 2016-07-09 ENCOUNTER — Telehealth: Payer: Self-pay | Admitting: Medical Oncology

## 2016-07-09 ENCOUNTER — Ambulatory Visit: Payer: Medicare Other | Attending: Family Medicine

## 2016-07-09 ENCOUNTER — Telehealth: Payer: Self-pay | Admitting: Internal Medicine

## 2016-07-09 NOTE — Telephone Encounter (Signed)
Patient called to cancel appt for MD on 4/9 but per Luanna Salk to keep appt for now and she will cancel if MD says okay.

## 2016-07-09 NOTE — Telephone Encounter (Signed)
Pt called to cancel f/u because she could not afford to get CT scan.

## 2016-07-09 NOTE — Telephone Encounter (Signed)
No follow up without scan.

## 2016-07-10 ENCOUNTER — Telehealth: Payer: Self-pay | Admitting: Internal Medicine

## 2016-07-10 NOTE — Telephone Encounter (Signed)
Cancelled 4/9 appt per MD - no scan was done.

## 2016-07-13 ENCOUNTER — Telehealth: Payer: Self-pay

## 2016-07-13 NOTE — Telephone Encounter (Signed)
Pt returned a call to the office. Pt states there was no message left she thinks the message is from either Opal Sidles or Diane?

## 2016-07-13 NOTE — Telephone Encounter (Signed)
I called her back and left a message to look at the email I sent to her and call me if she has any questions.  I need her patients tax return for 2016 or 2017.

## 2016-07-16 ENCOUNTER — Ambulatory Visit: Payer: Self-pay | Admitting: Internal Medicine

## 2016-07-16 ENCOUNTER — Ambulatory Visit: Payer: Medicare Other

## 2016-07-18 ENCOUNTER — Encounter (INDEPENDENT_AMBULATORY_CARE_PROVIDER_SITE_OTHER): Payer: Self-pay | Admitting: Ophthalmology

## 2016-07-18 DIAGNOSIS — H53002 Unspecified amblyopia, left eye: Secondary | ICD-10-CM

## 2016-07-18 DIAGNOSIS — H353122 Nonexudative age-related macular degeneration, left eye, intermediate dry stage: Secondary | ICD-10-CM

## 2016-07-18 DIAGNOSIS — H353211 Exudative age-related macular degeneration, right eye, with active choroidal neovascularization: Secondary | ICD-10-CM

## 2016-07-18 DIAGNOSIS — H43813 Vitreous degeneration, bilateral: Secondary | ICD-10-CM

## 2016-07-19 ENCOUNTER — Ambulatory Visit: Payer: Medicare Other | Admitting: Family Medicine

## 2016-07-20 ENCOUNTER — Ambulatory Visit: Payer: Medicare Other | Attending: Family Medicine

## 2016-07-23 ENCOUNTER — Telehealth: Payer: Self-pay | Admitting: Emergency Medicine

## 2016-07-23 NOTE — Telephone Encounter (Signed)
Spoke with pts daughter and she stated that the forms were mailed back to our office back in early march for the pt assistance forms for the proair and the symbicort.  Jonelle Sidle please advise if you have seen these forms. I dont see any phone notes that we have received these forms or if these forms have been sent in for assistance for the pt.

## 2016-07-24 NOTE — Telephone Encounter (Signed)
Spoke with daughter, I faxed the paperwork in for her mom today. Confirmation received. I will follow up on this to check status.

## 2016-07-24 NOTE — Telephone Encounter (Signed)
I called daughter and asked her to call me back. I have the forms in RB's cubby and just wanted to see what she wanted me to do with them. It looks like this was done back in January so I am not sure what to do. I will await call back from daughter

## 2016-07-27 NOTE — Telephone Encounter (Signed)
Amber Sidle, do you have a update?

## 2016-07-31 NOTE — Telephone Encounter (Signed)
Called and spoke to pt. Pt states she has not heard an update on pt assistance forms. Advised pt that sometimes it takes several weeks to process the forms. Pt aware to call our office when she hears updates.

## 2016-07-31 NOTE — Telephone Encounter (Signed)
I called [employer] to update status and they are missing income verification and medicare card from patient. I advised her to have her daughter call me or bring the documents to me so I can fax them over to complete application. Pt understood and will have her daughter call me.

## 2016-08-01 NOTE — Telephone Encounter (Signed)
lmtcb

## 2016-08-01 NOTE — Telephone Encounter (Signed)
Spoke with the pt's daughter  She states that she is going to try and email [place] a copy of the information needed through pt's mychart account  I advised I was unsure if we would be able to print it from there, but she can try sending it  Will forward to Jonelle Sidle to keep an eye out for this

## 2016-08-01 NOTE — Telephone Encounter (Signed)
Pt family calling back [phone].Hillery Hunter

## 2016-08-01 NOTE — Telephone Encounter (Signed)
Daughter called back, talk with mychart technical support because our clinic was not available, requesting an email to be sent so patient mychart can be updated... Contact # [phone].Marland KitchenMearl Latin

## 2016-08-02 NOTE — Telephone Encounter (Signed)
Spoke with daughter she states she spoke with MyChart tech support and stated that they can email [place] the information through [place] but we would need to send an email first and then when they reply they can attach the needed information. Sent a test email to pt's mychart. Daughter received it and will try to attach needed information for patient assistant

## 2016-08-02 NOTE — Telephone Encounter (Signed)
Daughter call still waiting to receive an email or message through [place], so patient mychart can be updated, states haven't heard from anyone.Marland Kitchen

## 2016-08-03 NOTE — Telephone Encounter (Signed)
Please see pt email and attachments

## 2016-08-08 NOTE — Telephone Encounter (Signed)
Amber Santiago have you received any of the these forms that were to be emailed in?  thanks

## 2016-08-09 MED FILL — BESIVANCE 0.6% SUSP: 0.6 | 30 days supply | Qty: 5 | Fill #0

## 2016-08-13 MED ORDER — BUDESONIDE-FORMOTEROL FUMARATE 160-4.5 MCG/ACT IN AERO: 2.0000 | INHALATION_SPRAY | Freq: Two times a day (BID) | RESPIRATORY_TRACT | 0 refills | Status: DC

## 2016-08-13 NOTE — Telephone Encounter (Addendum)
Amber Santiago sent a [employer] that she faxed missing forms. Called [employer] and spoke with Malachy Mood and she stated she received the fax but pt's income is not matching. States she was missing pt's SS benefit form. Verified fax number [phone] and printed what we have scanned and faxed.   lmom tcb x1 to daughter, pt was approved but they needed to verify pt's income, they stated it did not match on form. Also does her mom need samples of Symbicort?

## 2016-08-13 NOTE — Telephone Encounter (Signed)
Called [employer] they stated pt's gross income does not match on the form from what they added from pt's and husbands combined income. Stated a new form does not need to be filled out just cross out and write corrected amount. Forms refilled out and faxed and placed in Tamara's cubby.   Daughter is coming to office for samples of Symbicort for mom which has been placed up front for pick up.  Sent to Jonelle Sidle to follow up on

## 2016-08-13 NOTE — Telephone Encounter (Signed)
Amber Santiago (daughter) returning call, CB is [phone] or [phone].

## 2016-08-13 NOTE — Telephone Encounter (Signed)
Pt calling again about medication.Amber Santiago

## 2016-08-15 ENCOUNTER — Encounter (INDEPENDENT_AMBULATORY_CARE_PROVIDER_SITE_OTHER): Payer: Self-pay | Admitting: Ophthalmology

## 2016-08-15 DIAGNOSIS — H353231 Exudative age-related macular degeneration, bilateral, with active choroidal neovascularization: Secondary | ICD-10-CM

## 2016-08-15 DIAGNOSIS — H43813 Vitreous degeneration, bilateral: Secondary | ICD-10-CM

## 2016-08-15 DIAGNOSIS — H2513 Age-related nuclear cataract, bilateral: Secondary | ICD-10-CM

## 2016-08-16 ENCOUNTER — Telehealth: Payer: Self-pay | Admitting: *Deleted

## 2016-08-16 NOTE — Telephone Encounter (Signed)
Received a fax from [place] stating that she was approved for assistance for ProAir, She will be receiving a 90 day supply of ProAir up to 12 months. I called her daughter to let her know and couldn't leave a VM.

## 2016-08-16 NOTE — Telephone Encounter (Signed)
Oncology Nurse Navigator Documentation  Oncology Nurse Navigator Flowsheets 08/16/2016  Navigator Location [facility]  Navigator Encounter Type Telephone/I received phone calls from Amber Santiago today.  She needs order for her CT to be done at [facility].  I will update Dr. Julien Nordmann needing an order.   Telephone Outgoing Call;Incoming Call  Treatment Phase Follow-up  Barriers/Navigation Needs Coordination of Care  Interventions Coordination of Care  Coordination of Care Other  Acuity Level 2  Time Spent with Patient 30

## 2016-08-16 NOTE — Telephone Encounter (Signed)
I faxed these forms today. Will follow up to check status.

## 2016-08-17 NOTE — Telephone Encounter (Signed)
lmtcb for pt.  

## 2016-08-17 NOTE — Telephone Encounter (Signed)
Arden Axon (daughter) returning call, CB is [phone].

## 2016-08-17 NOTE — Telephone Encounter (Signed)
Daughter is aware and had no further questions. Jonelle Sidle is also working on pt to get [employer] school [employer] for Symbicort. Will close this message. Nothing further is needed

## 2016-08-20 ENCOUNTER — Telehealth: Payer: Self-pay | Admitting: *Deleted

## 2016-08-20 ENCOUNTER — Other Ambulatory Visit: Payer: Self-pay | Admitting: Medical Oncology

## 2016-08-20 NOTE — Telephone Encounter (Signed)
Oncology Nurse Navigator Documentation  Oncology Nurse Navigator Flowsheets 08/20/2016  Navigator Location [facility]  Navigator Encounter Type Telephone/Ms. Salmi called to get an update about CT scan.  I called and left a message that prescription for CT will be signed by Dr. Julien Nordmann tomorrow and I will fax it to [facility].  [facility] will then call patient to schedule. I asked that she call back with any questions   Telephone Outgoing Call;Incoming Call  Treatment Phase Follow-up  Barriers/Navigation Needs Coordination of Care  Interventions Coordination of Care  Coordination of Care Other  Acuity Level 2  Time Spent with Patient 30

## 2016-08-21 ENCOUNTER — Telehealth: Payer: Self-pay | Admitting: *Deleted

## 2016-08-21 NOTE — Telephone Encounter (Signed)
Oncology Nurse Navigator Documentation  Oncology Nurse Navigator Flowsheets 08/21/2016  Navigator Location [facility]  Navigator Encounter Type Telephone/I called [facility] today to get fax number.  I faxed order for CT Chest with contrast.  I called the patient.  I was unable to reach her but did leave vm message with an update on scan.    Telephone Outgoing Call  Treatment Phase Follow-up  Barriers/Navigation Needs Coordination of Care  Interventions Coordination of Care  Coordination of Care Other  Time Spent with Patient 30

## 2016-08-23 ENCOUNTER — Telehealth: Payer: Self-pay | Admitting: *Deleted

## 2016-08-23 ENCOUNTER — Encounter: Payer: Self-pay | Admitting: *Deleted

## 2016-08-23 NOTE — Telephone Encounter (Signed)
Oncology Nurse Navigator Documentation  Oncology Nurse Navigator Flowsheets 08/23/2016  Navigator Location [facility]  Navigator Encounter Type Telephone/I received vm message from patient's daughter regarding her Ct scan.  They have not heard from [facility].  I called [facility] and they have gotten the request.  They will call patient to schedule.  I call back and left vm message they will get a call with an appt for scan.   Telephone Outgoing Call  Treatment Phase Follow-up  Barriers/Navigation Needs Coordination of Care  Interventions Coordination of Care  Coordination of Care Other  Acuity Level 2  Time Spent with Patient 30

## 2016-08-23 NOTE — Progress Notes (Signed)
Oncology Nurse Navigator Documentation  Oncology Nurse Navigator Flowsheets 08/23/2016  Navigator Location [facility]  Navigator Encounter Type Other/I called [facility] to check and see if Amber Santiago is scheduled for CT.  She is scheduled for her scan on 08/29/16 and is aware of appt.  I notified scheduling team to schedule a follow up with Dr. Julien Nordmann the following week.   Treatment Phase Follow-up  Barriers/Navigation Needs Coordination of Care  Interventions Coordination of Care  Coordination of Care Appts  Acuity Level 2  Time Spent with Patient 30

## 2016-08-24 ENCOUNTER — Telehealth: Payer: Self-pay | Admitting: Internal Medicine

## 2016-08-24 NOTE — Telephone Encounter (Signed)
Scheduled appt per sch message from Rn Dana - left message with appt date and time and sent reminder letter in the mail.  

## 2016-08-28 ENCOUNTER — Encounter: Payer: Self-pay | Admitting: *Deleted

## 2016-08-28 ENCOUNTER — Telehealth: Payer: Self-pay | Admitting: Internal Medicine

## 2016-08-28 NOTE — Progress Notes (Signed)
Oncology Nurse Navigator Documentation  Oncology Nurse Navigator Flowsheets 08/28/2016  Navigator Location [facility]  Navigator Encounter Type Other/Dr. Julien Nordmann updated me that he would like to see Amber Santiago this Friday on 08/31/16.  I notified scheduling to call and schedule.   Treatment Phase Follow-up  Barriers/Navigation Needs Coordination of Care  Interventions Coordination of Care  Coordination of Care Appts  Acuity Level 2  Time Spent with Patient 30

## 2016-08-28 NOTE — Telephone Encounter (Signed)
Scheduled appt per sch message from Rn Dana - patient aware of appt date and time.  

## 2016-08-31 ENCOUNTER — Other Ambulatory Visit (HOSPITAL_BASED_OUTPATIENT_CLINIC_OR_DEPARTMENT_OTHER): Payer: Self-pay

## 2016-08-31 ENCOUNTER — Encounter: Payer: Self-pay | Admitting: Internal Medicine

## 2016-08-31 ENCOUNTER — Ambulatory Visit (HOSPITAL_BASED_OUTPATIENT_CLINIC_OR_DEPARTMENT_OTHER): Payer: Medicare Other | Admitting: Internal Medicine

## 2016-08-31 ENCOUNTER — Telehealth: Payer: Self-pay | Admitting: Family Medicine

## 2016-08-31 VITALS — BP 159/67 | HR 90 | Temp 98.1°F | Resp 18 | Ht 64.0 in | Wt 115.4 lb

## 2016-08-31 DIAGNOSIS — C3411 Malignant neoplasm of upper lobe, right bronchus or lung: Secondary | ICD-10-CM

## 2016-08-31 DIAGNOSIS — M4854XA Collapsed vertebra, not elsewhere classified, thoracic region, initial encounter for fracture: Secondary | ICD-10-CM

## 2016-08-31 DIAGNOSIS — J449 Chronic obstructive pulmonary disease, unspecified: Secondary | ICD-10-CM | POA: Diagnosis not present

## 2016-08-31 DIAGNOSIS — R5383 Other fatigue: Secondary | ICD-10-CM

## 2016-08-31 DIAGNOSIS — C3491 Malignant neoplasm of unspecified part of right bronchus or lung: Secondary | ICD-10-CM

## 2016-08-31 LAB — COMPREHENSIVE METABOLIC PANEL
ALBUMIN: 3.4 g/dL — AB (ref 3.5–5.0)
ALK PHOS: 123 U/L (ref 40–150)
ALT: 12 U/L (ref 0–55)
AST: 27 U/L (ref 5–34)
Anion Gap: 7 mEq/L (ref 3–11)
BILIRUBIN TOTAL: 0.38 mg/dL (ref 0.20–1.20)
BUN: 7.9 mg/dL (ref 7.0–26.0)
CALCIUM: 9.6 mg/dL (ref 8.4–10.4)
CO2: 30 mEq/L — ABNORMAL HIGH (ref 22–29)
CREATININE: 0.7 mg/dL (ref 0.6–1.1)
Chloride: 96 mEq/L — ABNORMAL LOW (ref 98–109)
EGFR: 89 mL/min/{1.73_m2} — ABNORMAL LOW (ref >90)
GLUCOSE: 109 mg/dL (ref 70–140)
POTASSIUM: 4.5 meq/L (ref 3.5–5.1)
Sodium: 133 mEq/L — ABNORMAL LOW (ref 136–145)
TOTAL PROTEIN: 7.2 g/dL (ref 6.4–8.3)

## 2016-08-31 LAB — CBC WITH DIFFERENTIAL/PLATELET
BASO%: 1.3 % (ref 0.0–2.0)
Basophils Absolute: 0.1 10*3/uL (ref 0.0–0.1)
EOS%: 0.6 % (ref 0.0–7.0)
Eosinophils Absolute: 0 10*3/uL (ref 0.0–0.5)
HEMATOCRIT: 40.3 % (ref 34.8–46.6)
HEMOGLOBIN: 13.7 g/dL (ref 11.6–15.9)
LYMPH#: 0.6 10*3/uL — AB (ref 0.9–3.3)
LYMPH%: 9.7 % — ABNORMAL LOW (ref 14.0–49.7)
MCH: 29.7 pg (ref 25.1–34.0)
MCHC: 34.1 g/dL (ref 31.5–36.0)
MCV: 87.1 fL (ref 79.5–101.0)
MONO#: 0.4 10*3/uL (ref 0.1–0.9)
MONO%: 6.2 % (ref 0.0–14.0)
NEUT#: 5.3 10*3/uL (ref 1.5–6.5)
NEUT%: 82.2 % — ABNORMAL HIGH (ref 38.4–76.8)
PLATELETS: 310 10*3/uL (ref 145–400)
RBC: 4.63 10*6/uL (ref 3.70–5.45)
RDW: 14.1 % (ref 11.2–14.5)
WBC: 6.5 10*3/uL (ref 3.9–10.3)

## 2016-08-31 MED FILL — BESIVANCE 0.6% SUSP: 0.6 | 30 days supply | Qty: 5 | Fill #1

## 2016-08-31 NOTE — Progress Notes (Signed)
[place] [phone]   [phone]  OFFICE PROGRESS NOTE  Arnoldo Morale, MD [place] [address]  DIAGNOSIS: Stage IIB (T3, N0, M0) non-small cell lung cancer, squamous cell carcinoma diagnosed in December 2016 and presented with large right upper lobe lung mass with satellite nodule.  PRIOR THERAPY: Definitive radiotherapy under the care of Dr. Sondra Come for a total dose of 64 gray completed 06/17/2015.  CURRENT THERAPY: Observation.  INTERVAL HISTORY: Amber Santiago 71 y.o. female returns to the clinic today for follow-up visit accompanied by her daughter. The patient is feeling fine today with no specific complaints except for the persistent dry cough. She also has shortness breath with exertion but no significant chest pain or hemoptysis. She denied having any recent weight loss or night sweats. She has no nausea, vomiting, diarrhea or constipation. Unfortunately her husband was diagnosed with a stroke recently and the daughter is under a lot of stress taking care of both of them and she was tearful at times. The patient had repeat CT scan of the chest performed at [facility] recently and she is here for evaluation and discussion of her scan results and treatment options.  MEDICAL HISTORY: Past Medical History:  Diagnosis Date  . 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 Arthritis   . Asthma   . Cancer ([place])    right lung  . Constipation   . COPD (chronic obstructive pulmonary disease) ([place])   . Hypertension   . Hyperthyroidism    PMH  . Lung nodule   . Non-traumatic compression fracture of T6 thoracic vertebra ([place]) 11/28/2015  . Pneumonia   . Radiation 05/05/15-06/17/15   right central chest area 64 gray  . Umbilical hernia     ALLERGIES:  is allergic to amoxicillin and penicillins.  MEDICATIONS:  Current Outpatient Prescriptions  Medication Sig Dispense Refill  . Acetaminophen (TYLENOL) 325 MG  CAPS Take 500 mg by mouth every 6 (six) hours as needed. Reported on 08/04/2015    . albuterol (PROAIR HFA) 108 (90 Base) MCG/ACT inhaler Inhale 2 puffs into the lungs every 6 (six) hours as needed for wheezing or shortness of breath. 1 Inhaler 3  . albuterol (PROVENTIL HFA;VENTOLIN HFA) 108 (90 Base) MCG/ACT inhaler Inhale 2 puffs into the lungs every 6 (six) hours as needed for wheezing or shortness of breath. 3 Inhaler 3  . budesonide-formoterol (SYMBICORT) 160-4.5 MCG/ACT inhaler INHALE TWO PUFFS INTO LUNGS TWICE DAILY 11 g 11  . budesonide-formoterol (SYMBICORT) 160-4.5 MCG/ACT inhaler Inhale 2 puffs into the lungs 2 (two) times daily. 2 Inhaler 0  . Cyanocobalamin (B-12 PO) Take 1 tablet by mouth daily. Reported on 08/04/2015    . dorzolamide (TRUSOPT) 2 % ophthalmic solution Place 1 drop into both eyes 2 (two) times daily.    . hydrOXYzine (ATARAX/VISTARIL) 25 MG tablet Take 1 tablet (25 mg total) by mouth 3 (three) times daily as needed. 90 tablet 1  . lisinopril-hydrochlorothiazide (PRINZIDE,ZESTORETIC) 20-25 MG tablet Take 1 tablet by mouth daily. 30 tablet 3  . Multiple Vitamins-Minerals (PRESERVISION AREDS 2 PO) Take 1 capsule by mouth daily. Reported on 07/05/2015    . naproxen (NAPROSYN) 500 MG tablet Take 1 tablet (500 mg total) by mouth 2 (two) times daily with a meal. 60 tablet 2  . Pyridoxine HCl (B-6 PO) Take 1 tablet by mouth daily. Reported on 08/04/2015    . Spacer/Aero-Holding Chambers (AEROCHAMBER MV) inhaler Use as instructed (Patient not  taking: Reported on 06/18/2016) 1 each 0   No current facility-administered medications for this visit.     SURGICAL HISTORY:  Past Surgical History:  Procedure Laterality Date  . APPENDECTOMY    . DILATION AND CURETTAGE OF UTERUS    . MEDIASTINOSCOPY N/A 04/12/2015   Procedure: MEDIASTINOSCOPY;  Surgeon: Grace Isaac, MD;  Location: [place];  Service: Thoracic;  Laterality: N/A;  . VIDEO BRONCHOSCOPY Bilateral 03/18/2015   Procedure:  VIDEO BRONCHOSCOPY WITH FLUORO;  Surgeon: Collene Gobble, MD;  Location: [facility];  Service: Cardiopulmonary;  Laterality: Bilateral;  . VIDEO BRONCHOSCOPY WITH ENDOBRONCHIAL ULTRASOUND N/A 04/12/2015   Procedure: VIDEO BRONCHOSCOPY WITH ENDOBRONCHIAL ULTRASOUND;  Surgeon: Grace Isaac, MD;  Location: [facility];  Service: Thoracic;  Laterality: N/A;    REVIEW OF SYSTEMS:  Constitutional: positive for fatigue Eyes: negative Ears, nose, mouth, throat, and face: negative Respiratory: positive for cough and dyspnea on exertion Cardiovascular: negative Gastrointestinal: negative Genitourinary:negative Integument/breast: negative Hematologic/lymphatic: negative Musculoskeletal:negative Neurological: negative Behavioral/Psych: negative Endocrine: negative Allergic/Immunologic: negative   PHYSICAL EXAMINATION: General appearance: alert, cooperative, fatigued and no distress Head: Normocephalic, without obvious abnormality, atraumatic Neck: no adenopathy, no JVD, supple, symmetrical, trachea midline and thyroid not enlarged, symmetric, no tenderness/mass/nodules Lymph nodes: Cervical, supraclavicular, and axillary nodes normal. Resp: wheezes bilaterally Back: symmetric, no curvature. ROM normal. No CVA tenderness. Cardio: regular rate and rhythm, S1, S2 normal, no murmur, click, rub or gallop GI: soft, non-tender; bowel sounds normal; no masses,  no organomegaly Extremities: extremities normal, atraumatic, no cyanosis or edema Neurologic: Alert and oriented X 3, normal strength and tone. Normal symmetric reflexes. Normal coordination and gait  ECOG PERFORMANCE STATUS: 1 - Symptomatic but completely ambulatory  Blood pressure (!) 159/67, pulse 90, temperature 98.1 F (36.7 C), temperature source Oral, resp. rate 18, height 5\' 4"  (1.626 m), weight 115 lb 6.4 oz (52.3 kg), SpO2 97 %.  LABORATORY DATA: Lab Results  Component Value Date   WBC 6.5 08/31/2016   HGB 13.7 08/31/2016   HCT  40.3 08/31/2016   MCV 87.1 08/31/2016   PLT 310 08/31/2016      Chemistry      Component Value Date/Time   NA 133 (L) 08/31/2016 1003   K 4.5 08/31/2016 1003   CL 99 (L) 04/06/2015 1242   CO2 30 (H) 08/31/2016 1003   BUN 7.9 08/31/2016 1003   CREATININE 0.7 08/31/2016 1003      Component Value Date/Time   CALCIUM 9.6 08/31/2016 1003   ALKPHOS 123 08/31/2016 1003   AST 27 08/31/2016 1003   ALT 12 08/31/2016 1003   BILITOT 0.38 08/31/2016 1003       RADIOGRAPHIC STUDIES: COMPARISON: March 23, 2016 INDICATION: Malignant neoplasm of unspecified part of right bronchus or lung TECHNIQUE: CT CHEST W IV CONTRAST - Radiation dose reduction was utilized (automated exposure control, mA or kV adjustment based on patient size, or iterative image reconstruction). Exam date/time: 08/29/2016 3:25 PM   FINDINGS:   SUPPORT APPARATUS: N.A.   LUNGS/PLEURA: Right upper lobe fibrosis with mild progression since prior exam. There is increasing pleural thickening along within the right lung apex. No pneumothorax. 18 mm x 13 mm right hilar lymph node image 28. Previously measured 12 mm x 9 mm. In stable 4 mm left upper lobe and 3 mm left lower lobe pulmonary nodules appear No abnormal pulmonary masses.  No pleural effusions.  HEART/MEDIASTINUM: Cardiac size is normal. No acute thoracic aortic abnormalities.  No hilar, mediastinal, or axillary lymphadenopathy.  MUSCULOSKELETAL: No change in T6 compression fracture.  CHRONIC/INCIDENTAL FINDINGS: N.A.   IMPRESSION: 1. Interval increase in pleural thickening and parenchymal opacity right lung apex since prior exam.  2. Increasing size right hilar lymph node. 3. Stable left lung pulmonary nodules.   ASSESSMENT AND PLAN:  This is a very pleasant 71 years old white female with history of stage IIb non-small cell lung cancer status post curative radiotherapy and has been observation since March 2017. The patient had repeat CT scan of  the chest performed recently. I personally and independently reviewed the scan images and discuss the results with the patient and her daughter and showed them the images of the current and previous scans. Unfortunately there was increase in the size of right hilar lymph node compared to the previous scan. I had a lengthy discussion with the patient and her daughter about her current condition and treatment options. I gave the patient has the option of consideration of systemic chemotherapy versus close monitoring and repeat imaging studies. The patient has no interest in systemic chemotherapy at this point and she would like to continue on observation. I will arrange for her to have repeat CT scan of the chest in 3 months for restaging of her disease. For COPD, she will continue with her current medications. She was advised to call immediately if she has any concerning symptoms in the interval. The patient and her daughter had several questions and I answered them completely to their satisfaction today. I gave the patient and her husband the time to ask of breast induration one half a lengthy discussion about her condition. The patient voices understanding of current disease status and treatment options and is in agreement with the current care plan. All questions were answered. The patient knows to call the clinic with any problems, questions or concerns. We can certainly see the patient much sooner if necessary. I spent 20 minutes counseling the patient face to face. The total time spent in the appointment was 30 minutes.  Disclaimer: This note was dictated with voice recognition software. Similar sounding words can inadvertently be transcribed and may not be corrected upon review.

## 2016-08-31 NOTE — Progress Notes (Signed)
Patient and daughter came back. I asked if they had applied for Medicaid. She states they had and were denied due to resources being over the threshold. I was going to offer a medicaid application. She also states they have called the [employer] which helps Medicare patients with a number of resources and they were denied because the didn't purchase Medicare Part B.  Asked about the visit today with the doctor and what the plan was. Daughter states chemotherapy was suggested and her mom was opting against it because of not having coverage as well as not wanting to feel sick. Advised her that we have a team that works to get all the help possible as far as the treatment drugs and she will still receive the 55% discount for the services in [facility]. They verbalized understanding.    Gave them my card for any additional financial questions or concerns and advised them that if I found any additional resources, I would be glad to share with them. They were very appreciative.

## 2016-08-31 NOTE — Telephone Encounter (Signed)
Patient would like a second opinion with another oncologist.

## 2016-08-31 NOTE — Patient Instructions (Signed)
Steps to Quit Smoking Smoking tobacco can be bad for your health. It can also affect almost every organ in your body. Smoking puts you and people around you at risk for many serious long-lasting (chronic) diseases. Quitting smoking is hard, but it is one of the best things that you can do for your health. It is never too late to quit. What are the benefits of quitting smoking? When you quit smoking, you lower your risk for getting serious diseases and conditions. They can include:  Lung cancer or lung disease.  Heart disease.  Stroke.  Heart attack.  Not being able to have children (infertility).  Weak bones (osteoporosis) and broken bones (fractures). If you have coughing, wheezing, and shortness of breath, those symptoms may get better when you quit. You may also get sick less often. If you are pregnant, quitting smoking can help to lower your chances of having a baby of low birth weight. What can I do to help me quit smoking? Talk with your doctor about what can help you quit smoking. Some things you can do (strategies) include:  Quitting smoking totally, instead of slowly cutting back how much you smoke over a period of time.  Going to in-person counseling. You are more likely to quit if you go to many counseling sessions.  Using resources and support systems, such as:  Online chats with a counselor.  Phone quitlines.  Printed self-help materials.  Support groups or group counseling.  Text messaging programs.  Mobile phone apps or applications.  Taking medicines. Some of these medicines may have nicotine in them. If you are pregnant or breastfeeding, do not take any medicines to quit smoking unless your doctor says it is okay. Talk with your doctor about counseling or other things that can help you. Talk with your doctor about using more than one strategy at the same time, such as taking medicines while you are also going to in-person counseling. This can help make quitting  easier. What things can I do to make it easier to quit? Quitting smoking might feel very hard at first, but there is a lot that you can do to make it easier. Take these steps:  Talk to your family and friends. Ask them to support and encourage you.  Call phone quitlines, reach out to support groups, or work with a counselor.  Ask people who smoke to not smoke around you.  Avoid places that make you want (trigger) to smoke, such as:  Bars.  Parties.  Smoke-break areas at work.  Spend time with people who do not smoke.  Lower the stress in your life. Stress can make you want to smoke. Try these things to help your stress:  Getting regular exercise.  Deep-breathing exercises.  Yoga.  Meditating.  Doing a body scan. To do this, close your eyes, focus on one area of your body at a time from head to toe, and notice which parts of your body are tense. Try to relax the muscles in those areas.  Download or buy apps on your mobile phone or tablet that can help you stick to your quit plan. There are many free apps, such as QuitGuide from the CDC (Centers for Disease Control and Prevention). You can find more support from smokefree.gov and other websites. This information is not intended to replace advice given to you by your health care provider. Make sure you discuss any questions you have with your health care provider. Document Released: 01/20/2009 Document Revised: 11/22/2015 Document   Reviewed: 08/10/2014 Elsevier Interactive Patient Education  2017 Elsevier Inc.  

## 2016-08-31 NOTE — Telephone Encounter (Signed)
Patient's daughter called the office asking to speak with provider in regards to getting a referral to new specialist. Patient has cancer and they want to see someone new for a second opinion. Please follow up.  Thank you.

## 2016-08-31 NOTE — Progress Notes (Signed)
Patient referred by lab staff to discuss financial concerns. Introduced myself as Arboriculturist and asked about the concerns. Patient's daughter states that patient only has Medicare Part A due to missing the deadline to apply for Medicare Part B once patient's spouse was no longer working and coverage ended for his job. They have several concerns regarding bills and treatments. Advised of the 55% uninsured discount which would automatically be received for being uninsured. They also state they applied for the [place] settlement through the hospital and were denied due to not having Medicare Part B. Patient's pager went off and had to go to appointment. Advised them to come back after appointment to discuss options.

## 2016-09-02 ENCOUNTER — Other Ambulatory Visit: Payer: Self-pay | Admitting: Internal Medicine

## 2016-09-05 NOTE — Telephone Encounter (Signed)
Referral placed.

## 2016-09-12 ENCOUNTER — Encounter (INDEPENDENT_AMBULATORY_CARE_PROVIDER_SITE_OTHER): Payer: Self-pay | Admitting: Ophthalmology

## 2016-09-12 DIAGNOSIS — H43813 Vitreous degeneration, bilateral: Secondary | ICD-10-CM

## 2016-09-12 DIAGNOSIS — H353211 Exudative age-related macular degeneration, right eye, with active choroidal neovascularization: Secondary | ICD-10-CM

## 2016-09-12 DIAGNOSIS — H353122 Nonexudative age-related macular degeneration, left eye, intermediate dry stage: Secondary | ICD-10-CM

## 2016-09-17 ENCOUNTER — Ambulatory Visit: Payer: Medicare Other | Admitting: Internal Medicine

## 2016-09-24 ENCOUNTER — Encounter: Payer: Self-pay | Admitting: Family Medicine

## 2016-09-26 ENCOUNTER — Ambulatory Visit (HOSPITAL_COMMUNITY)
Admission: EM | Admit: 2016-09-26 | Discharge: 2016-09-26 | Disposition: A | Discharge status: Nursing Home (Includes ICF) | Payer: Medicare Other

## 2016-10-08 ENCOUNTER — Encounter (INDEPENDENT_AMBULATORY_CARE_PROVIDER_SITE_OTHER): Payer: Medicare Other | Admitting: Ophthalmology

## 2016-10-08 DIAGNOSIS — H353231 Exudative age-related macular degeneration, bilateral, with active choroidal neovascularization: Secondary | ICD-10-CM | POA: Diagnosis not present

## 2016-10-08 DIAGNOSIS — H43813 Vitreous degeneration, bilateral: Secondary | ICD-10-CM

## 2016-10-09 ENCOUNTER — Encounter (INDEPENDENT_AMBULATORY_CARE_PROVIDER_SITE_OTHER): Payer: Self-pay | Admitting: Ophthalmology

## 2016-10-14 ENCOUNTER — Other Ambulatory Visit: Payer: Self-pay | Admitting: Family Medicine

## 2016-10-17 ENCOUNTER — Other Ambulatory Visit (HOSPITAL_COMMUNITY): Payer: Self-pay | Admitting: Hematology and Oncology

## 2016-10-17 DIAGNOSIS — C3490 Malignant neoplasm of unspecified part of unspecified bronchus or lung: Secondary | ICD-10-CM

## 2016-10-29 MED FILL — DORZOLAMIDE HCL 2% EYE DRP: 2 | 30 days supply | Qty: 10 | Fill #0

## 2016-10-31 ENCOUNTER — Other Ambulatory Visit: Payer: Self-pay | Admitting: Hematology and Oncology

## 2016-10-31 DIAGNOSIS — S22000A Wedge compression fracture of unspecified thoracic vertebra, initial encounter for closed fracture: Secondary | ICD-10-CM

## 2016-11-02 ENCOUNTER — Telehealth: Payer: Self-pay | Admitting: Emergency Medicine

## 2016-11-02 NOTE — Telephone Encounter (Signed)
lmtcb X1 for pt- [name] is not on DPR.

## 2016-11-05 NOTE — Telephone Encounter (Signed)
lmomtcb x 2  

## 2016-11-05 NOTE — Telephone Encounter (Signed)
I have called and spoke with the pt and she stated that it was ok to speak with [name] on her behalf.  I spoke with [name] and she is aware that they would need to contact the drug company---they will send out the symbicort to the pt in the next 7-10 days.  Nothing further is needed.

## 2016-11-05 NOTE — Telephone Encounter (Signed)
Daughter returned phone call..ert

## 2016-11-08 ENCOUNTER — Ambulatory Visit
Admission: RE | Admit: 2016-11-08 | Discharge: 2016-11-08 | Disposition: A | Discharge status: Home / Self Care | Payer: Medicare Other | Source: Ambulatory Visit | Attending: Hematology and Oncology | Admitting: Hematology and Oncology

## 2016-11-08 ENCOUNTER — Other Ambulatory Visit (HOSPITAL_COMMUNITY): Payer: Self-pay | Admitting: Diagnostic Radiology

## 2016-11-08 DIAGNOSIS — S22000A Wedge compression fracture of unspecified thoracic vertebra, initial encounter for closed fracture: Secondary | ICD-10-CM

## 2016-11-08 HISTORY — PX: IR RADIOLOGIST EVAL & MGMT: IMG5224

## 2016-11-08 NOTE — Consult Note (Signed)
Chief Complaint: Patient was seen in consultation today for  Chief Complaint  Patient presents with  . Advice Only    Consult for Kyphoplasty     at the request of Huff,Jason D.  Referring Physician(s): Huff,Jason D.  History of Present Illness: Amber Santiago is a 71 y.o. female with history of non-small cell right upper lobe lung cancer.  Patient was diagnosed in 2016 and underwent radiotherapy completed in March 2017.  Patient presents with long standing upper back pain.  She has been having symptoms for approximately one year without relief.  Pain is worse when she is lying down.  She recently tried narcotics but did not tolerate well.  Recent cross-sectional imaging demonstrated compression fractures at T6 and T4.   There is also concern for inferior fracture at T5.  The T6 fracture is chronic since a Chest CT from 08/29/16 but new since 11/02/15.  Patient's other main complaint is bilateral foot edema.  Patient has knee and thigh high compression hose but she has not been wearing them regularly.  She complains of constipation and chronic breathing problems.  Chronic pain along the lateral ribs bilaterally.    Past Medical History:  Diagnosis Date  . 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 Arthritis   . Asthma   . Cancer ([place])    right lung  . Constipation   . COPD (chronic obstructive pulmonary disease) ([place])   . Hypertension   . Hyperthyroidism    PMH  . Lung nodule   . Non-traumatic compression fracture of T6 thoracic vertebra ([place]) 11/28/2015  . Pneumonia   . Radiation 05/05/15-06/17/15   right central chest area 64 gray  . Umbilical hernia     Past Surgical History:  Procedure Laterality Date  . APPENDECTOMY    . DILATION AND CURETTAGE OF UTERUS    . MEDIASTINOSCOPY N/A 04/12/2015   Procedure: MEDIASTINOSCOPY;  Surgeon: Grace Isaac, MD;  Location: [place];  Service: Thoracic;  Laterality: N/A;  . VIDEO BRONCHOSCOPY Bilateral 03/18/2015   Procedure:  VIDEO BRONCHOSCOPY WITH FLUORO;  Surgeon: Collene Gobble, MD;  Location: [place];  Service: Cardiopulmonary;  Laterality: Bilateral;  . VIDEO BRONCHOSCOPY WITH ENDOBRONCHIAL ULTRASOUND N/A 04/12/2015   Procedure: VIDEO BRONCHOSCOPY WITH ENDOBRONCHIAL ULTRASOUND;  Surgeon: Grace Isaac, MD;  Location: [facility];  Service: Thoracic;  Laterality: N/A;    Allergies: Amoxicillin and Penicillins  Medications: Prior to Admission medications   Medication Sig Start Date End Date Taking? Authorizing Provider  Acetaminophen (TYLENOL) 325 MG CAPS Take 500 mg by mouth every 6 (six) hours as needed. Reported on 08/04/2015    [provider]  albuterol (PROAIR HFA) 108 (90 Base) MCG/ACT inhaler Inhale 2 puffs into the lungs every 6 (six) hours as needed for wheezing or shortness of breath. 04/16/16   Collene Gobble, MD  budesonide-formoterol (SYMBICORT) 160-4.5 MCG/ACT inhaler INHALE TWO PUFFS INTO LUNGS TWICE DAILY 02/08/15   Collene Gobble, MD  Cyanocobalamin (B-12 PO) Take 1 tablet by mouth daily. Reported on 08/04/2015    [provider]  dorzolamide (TRUSOPT) 2 % ophthalmic solution INSTILL 2 DROPS INTO BOTH EYES TWICE A DAY 10/16/16   Funches, Adriana Mccallum, MD  Multiple Vitamins-Minerals (PRESERVISION AREDS 2 PO) Take 1 capsule by mouth daily. Reported on 07/05/2015    [provider]  Pyridoxine HCl (B-6 PO) Take 1 tablet by mouth daily. Reported on 08/04/2015    [provider]  Spacer/Aero-Holding Chambers (AEROCHAMBER MV) inhaler Use  as instructed Patient not taking: Reported on 06/18/2016 07/17/13   Collene Gobble, MD     Family History  Problem Relation Age of Onset  . Breast cancer Maternal Grandmother     Social History   Social History  . Marital status: Married    Spouse name: N/A  . Number of children: 4  . Years of education: N/A   Occupational History  . retired    Social History Main Topics  . Smoking status: Current Every Day Smoker     Packs/day: 1.00    Years: 50.00    Types: Cigarettes  . Smokeless tobacco: Never Used  . Alcohol use No  . Drug use: No  . Sexual activity: Not on file   Other Topics Concern  . Not on file   Social History Narrative  . No narrative on file     Review of Systems: A 12 point ROS discussed and pertinent positives are indicated in the HPI above.  All other systems are negative.  Review of Systems  Respiratory: Positive for shortness of breath.   Cardiovascular: Positive for leg swelling. Negative for palpitations.  Gastrointestinal: Positive for abdominal distention and constipation. Negative for abdominal pain.  Genitourinary: Positive for frequency.  Musculoskeletal: Positive for back pain.    Vital Signs: BP (!) 141/64   Pulse 89   Temp 98 F (36.7 C) (Oral)   Resp 15   Ht 5\' 4"  (1.626 m)   Wt 112 lb (50.8 kg)   SpO2 98%   BMI 19.22 kg/m   Physical Exam  Constitutional: She is oriented to person, place, and time. No distress.  Cardiovascular: Normal rate, regular rhythm and normal heart sounds.   Pulmonary/Chest: Effort normal and breath sounds normal. No respiratory distress.  Abdominal: Bowel sounds are normal. She exhibits no distension. There is no tenderness.  Musculoskeletal:  Point tenderness in upper thoracic spine along the spinous processes.  Area of pain near T5.    Neurological: She is alert and oriented to person, place, and time.      Imaging: No results found.  Labs:  CBC:  Recent Labs  03/23/16 1114 08/31/16 1003  WBC 7.8 6.5  HGB 13.1 13.7  HCT 39.4 40.3  PLT 274 310    COAGS: No results for input(s): INR, APTT in the last 8760 hours.  BMP:  Recent Labs  03/23/16 1114 08/31/16 1003  NA 136 133*  K 4.3 4.5  CO2 29 30*  GLUCOSE 114 109  BUN 6.8* 7.9  CALCIUM 9.4 9.6  CREATININE 0.6 0.7    LIVER FUNCTION TESTS:  Recent Labs  03/23/16 1114 08/31/16 1003  BILITOT 0.31 0.38  AST 26 27  ALT 13 12  ALKPHOS 141 123    PROT 7.1 7.2  ALBUMIN 3.2* 3.4*    TUMOR MARKERS: No results for input(s): AFPTM, CEA, CA199, CHROMGRNA in the last 8760 hours.  Assessment and Plan:  71 yo with history of right lung cancer and complains of upper thoracic back pain.  Recent MRI demonstrates fractures at T4, T5 and T6.  T6 fracture is late subacute or chronic.  Patient's pain and symptoms correspond of the areas of abnormality on MRI.  MRI always raised concern for pathologic fractures and tumor involvement along the right chest wall involving T4, T5 and T6.  Discussed biopsy and kyphoplasty in the upper thoracic spine.  Explained the kyphoplasty/vertebroplasty procedure to patient and family in depth.  They have a good understanding of the  procedure and understand the goal is pain control.  They also understand that patient's pain could be related to chest wall disease. Recommend vertebral body augmentation at T4 and T5 because these are likely acute or subacute.  In addition, would plan for vertebral body biopsies at these levels to stage the patient's disease and exclude pathologic fractures.  Based on the spinal levels of disease, felt that Dr. Estanislado Pandy in Neuroradiology would be best suited to treat this patient.  I reviewed the images with Dr. Estanislado Pandy and he feels that she would be a candidate for biopsy and vertebral body augmentation in upper thoracic spine.  Will defer to his judgement about which vertebral bodies can and should be treated.    Thank you for this interesting consult.  I greatly enjoyed meeting Rosan Calbert and look forward to participating in their care.  A copy of this report was sent to the requesting provider on this date.  Electronically Signed: Carylon Perches 11/08/2016, 1:12 PM   I spent a total of 20 Minutes    in face to face in clinical consultation, greater than 50% of which was counseling/coordinating care for vertebral body compression fractures.

## 2016-11-09 ENCOUNTER — Other Ambulatory Visit: Payer: Self-pay | Admitting: Radiology

## 2016-11-12 ENCOUNTER — Encounter (HOSPITAL_COMMUNITY): Payer: Self-pay

## 2016-11-12 ENCOUNTER — Other Ambulatory Visit (HOSPITAL_COMMUNITY): Payer: Self-pay | Admitting: Diagnostic Radiology

## 2016-11-12 ENCOUNTER — Ambulatory Visit (HOSPITAL_COMMUNITY)
Admission: RE | Admit: 2016-11-12 | Discharge: 2016-11-12 | Disposition: A | Discharge status: Home / Self Care | Payer: Medicare Other | Source: Ambulatory Visit | Attending: Diagnostic Radiology | Admitting: Diagnostic Radiology

## 2016-11-12 DIAGNOSIS — M1990 Unspecified osteoarthritis, unspecified site: Secondary | ICD-10-CM | POA: Diagnosis not present

## 2016-11-12 DIAGNOSIS — J449 Chronic obstructive pulmonary disease, unspecified: Secondary | ICD-10-CM | POA: Insufficient documentation

## 2016-11-12 DIAGNOSIS — S22000A Wedge compression fracture of unspecified thoracic vertebra, initial encounter for closed fracture: Secondary | ICD-10-CM

## 2016-11-12 DIAGNOSIS — I10 Essential (primary) hypertension: Secondary | ICD-10-CM | POA: Diagnosis not present

## 2016-11-12 DIAGNOSIS — Z85118 Personal history of other malignant neoplasm of bronchus and lung: Secondary | ICD-10-CM | POA: Insufficient documentation

## 2016-11-12 DIAGNOSIS — G8929 Other chronic pain: Secondary | ICD-10-CM | POA: Insufficient documentation

## 2016-11-12 DIAGNOSIS — M4854XA Collapsed vertebra, not elsewhere classified, thoracic region, initial encounter for fracture: Secondary | ICD-10-CM | POA: Diagnosis not present

## 2016-11-12 DIAGNOSIS — Z880 Allergy status to penicillin: Secondary | ICD-10-CM | POA: Diagnosis not present

## 2016-11-12 DIAGNOSIS — F17210 Nicotine dependence, cigarettes, uncomplicated: Secondary | ICD-10-CM | POA: Insufficient documentation

## 2016-11-12 DIAGNOSIS — Z923 Personal history of irradiation: Secondary | ICD-10-CM | POA: Insufficient documentation

## 2016-11-12 DIAGNOSIS — E0590 Thyrotoxicosis, unspecified without thyrotoxic crisis or storm: Secondary | ICD-10-CM | POA: Insufficient documentation

## 2016-11-12 DIAGNOSIS — H3530 Unspecified macular degeneration: Secondary | ICD-10-CM | POA: Diagnosis not present

## 2016-11-12 HISTORY — PX: IR VERTEBROPLASTY EA ADDL (T&LS) BX INC UNI/BIL INC INJECT/IMAGING: IMG5517

## 2016-11-12 HISTORY — PX: IR VERTEBROPLASTY CERV/THOR BX INC UNI/BIL INC/INJECT/IMAGING: IMG5515

## 2016-11-12 LAB — CBC
HCT: 38.7 % (ref 36.0–46.0)
HEMOGLOBIN: 13 g/dL (ref 12.0–15.0)
MCH: 29.2 pg (ref 26.0–34.0)
MCHC: 33.6 g/dL (ref 30.0–36.0)
MCV: 87 fL (ref 78.0–100.0)
Platelets: 326 10*3/uL (ref 150–400)
RBC: 4.45 MIL/uL (ref 3.87–5.11)
RDW: 14.3 % (ref 11.5–15.5)
WBC: 6.8 10*3/uL (ref 4.0–10.5)

## 2016-11-12 LAB — BASIC METABOLIC PANEL
Anion gap: 7 (ref 5–15)
BUN: 8 mg/dL (ref 6–20)
CALCIUM: 8.8 mg/dL — AB (ref 8.9–10.3)
CHLORIDE: 99 mmol/L — AB (ref 101–111)
CO2: 29 mmol/L (ref 22–32)
Creatinine, Ser: 0.57 mg/dL (ref 0.44–1.00)
GFR calc Af Amer: >60 mL/min (ref >60)
GLUCOSE: 114 mg/dL — AB (ref 65–99)
Potassium: 3.7 mmol/L (ref 3.5–5.1)
SODIUM: 135 mmol/L (ref 135–145)

## 2016-11-12 LAB — PROTIME-INR
INR: 0.99
PROTHROMBIN TIME: 13 s (ref 11.4–15.2)

## 2016-11-12 LAB — APTT: aPTT: 30 seconds (ref 24–36)

## 2016-11-12 MED ORDER — MIDAZOLAM HCL 2 MG/2ML IJ SOLN
INTRAMUSCULAR | Status: AC
  Filled 2016-11-12: qty 6

## 2016-11-12 MED ORDER — LIDOCAINE HCL 1 % IJ SOLN
INTRAMUSCULAR | Status: AC | PRN
  Administered 2016-11-12: 15 mL

## 2016-11-12 MED ORDER — FLUMAZENIL 0.5 MG/5ML IV SOLN
INTRAVENOUS | Status: AC
  Filled 2016-11-12: qty 5

## 2016-11-12 MED ORDER — IOPAMIDOL (ISOVUE-300) INJECTION 61%
INTRAVENOUS | Status: AC
  Administered 2016-11-12: 5 mL
  Filled 2016-11-12: qty 50

## 2016-11-12 MED ORDER — GELATIN ABSORBABLE 12-7 MM EX MISC
CUTANEOUS | Status: AC
  Filled 2016-11-12: qty 1

## 2016-11-12 MED ORDER — TOBRAMYCIN SULFATE 1.2 G IJ SOLR
INTRAMUSCULAR | Status: AC
  Filled 2016-11-12: qty 1.2

## 2016-11-12 MED ORDER — SODIUM CHLORIDE 0.9 % IV SOLN: INTRAVENOUS | Status: AC

## 2016-11-12 MED ORDER — NALOXONE HCL 0.4 MG/ML IJ SOLN
INTRAMUSCULAR | Status: AC
  Filled 2016-11-12: qty 1

## 2016-11-12 MED ORDER — FENTANYL CITRATE (PF) 100 MCG/2ML IJ SOLN
INTRAMUSCULAR | Status: AC | PRN
  Administered 2016-11-12 (×2): 25 ug via INTRAVENOUS
  Administered 2016-11-12: 12.5 ug via INTRAVENOUS
  Administered 2016-11-12: 25 ug via INTRAVENOUS

## 2016-11-12 MED ORDER — VANCOMYCIN HCL IN DEXTROSE 1-5 GM/200ML-% IV SOLN
1000.0000 mg | Freq: Once | INTRAVENOUS | Status: AC
  Administered 2016-11-12: 1000 mg via INTRAVENOUS

## 2016-11-12 MED ORDER — MIDAZOLAM HCL 2 MG/2ML IJ SOLN
INTRAMUSCULAR | Status: AC | PRN
  Administered 2016-11-12 (×2): 0.5 mg via INTRAVENOUS

## 2016-11-12 MED ORDER — FENTANYL CITRATE (PF) 100 MCG/2ML IJ SOLN
INTRAMUSCULAR | Status: AC
Start: 2016-11-12 — End: 2016-11-12
  Filled 2016-11-12: qty 4

## 2016-11-12 MED ORDER — SODIUM CHLORIDE 0.9 % IV SOLN
INTRAVENOUS | Status: DC
  Administered 2016-11-12: 08:00:00 via INTRAVENOUS

## 2016-11-12 MED ORDER — VANCOMYCIN HCL IN DEXTROSE 1-5 GM/200ML-% IV SOLN
INTRAVENOUS | Status: AC
  Administered 2016-11-12: 1000 mg via INTRAVENOUS
  Filled 2016-11-12: qty 200

## 2016-11-12 MED ORDER — BUPIVACAINE HCL (PF) 0.25 % IJ SOLN
INTRAMUSCULAR | Status: AC
  Filled 2016-11-12: qty 60

## 2016-11-12 MED ORDER — HYDROMORPHONE HCL 1 MG/ML IJ SOLN
INTRAMUSCULAR | Status: AC
  Filled 2016-11-12: qty 1

## 2016-11-12 NOTE — Sedation Documentation (Signed)
Patient is resting comfortably. 

## 2016-11-12 NOTE — H&P (Signed)
Chief Complaint: Patient was seen in consultation today for Thoracic 4 and 5 Kyphoplasty/Vertebroplasty at the request of Dr Lenard Forth  Referring Physician(s): Dr Lenard Forth  Supervising Physician: Luanne Bras  Patient Status: [facility] - Out-pt  History of Present Illness: Amber Santiago is a 71 y.o. female   Hx Right Lung Ca 2016 Radiation therapy with Dr Julien Nordmann Worsening upper back pain for weeks MRI 10/19/16: 1. Acute/subacute inferior endplate fracture at T3 with diffuse enhancement of the vertebral body extending into the posterior elements. This may represent a pathologic fracture. 2. Linear fracture along the inferior aspect of the T5 vertebral body anteriorly compatible with tumor invasion in pathologic fracture. 3. Remote T6 fracture. 4. Extensive right upper lobe recurrent tumor with chest wall invasion involving T4, T5, and T6. 5. Involvement of right second through sixth ribs.  PET 10/29/16: 1. Acute/subacute inferior endplate fracture at T3 with diffuse enhancement of the vertebral body extending into the posterior elements. This may represent a pathologic fracture. 2. Linear fracture along the inferior aspect of the T5 vertebral body anteriorly compatible with tumor invasion in pathologic fracture. 3. Remote T6 fracture. 4. Extensive right upper lobe recurrent tumor with chest wall invasion involving T4, T5, and T6. 5. Involvement of right second through sixth ribs.  Was referred to Int Rad for evaluation and possible treatment of painful acute spinal fractures Dr Anselm Pancoast Note 11/08/16: 71 yo with history of right lung cancer and complains of upper thoracic back pain.  Recent MRI demonstrates fractures at T4, T5 and T6.  T6 fracture is late subacute or chronic.  Patient's pain and symptoms correspond of the areas of abnormality on MRI.  MRI always raised concern for pathologic fractures and tumor involvement along the right chest wall involving T4, T5 and T6.   Discussed biopsy and kyphoplasty in the upper thoracic spine.  Explained the kyphoplasty/vertebroplasty procedure to patient and family in depth.  They have a good understanding of the procedure and understand the goal is pain control.  They also understand that patient's pain could be related to chest wall disease. Recommend vertebral body augmentation at T4 and T5 because these are likely acute or subacute.  In addition, would plan for vertebral body biopsies at these levels to stage the patient's disease and exclude pathologic fractures.  Based on the spinal levels of disease, felt that Dr. Estanislado Pandy in Neuroradiology would be best suited to treat this patient.  I reviewed the images with Dr. Estanislado Pandy and he feels that she would be a candidate for biopsy and vertebral body augmentation in upper thoracic spine.  Will defer to his judgement about which vertebral bodies can and should be treated.     Past Medical History:  Diagnosis Date  . 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 Arthritis   . Asthma   . Cancer ([place])    right lung  . Constipation   . COPD (chronic obstructive pulmonary disease) ([place])   . Hypertension   . Hyperthyroidism    PMH  . Lung nodule   . Non-traumatic compression fracture of T6 thoracic vertebra ([place]) 11/28/2015  . Pneumonia   . Radiation 05/05/15-06/17/15   right central chest area 64 gray  . Umbilical hernia     Past Surgical History:  Procedure Laterality Date  . APPENDECTOMY    . DILATION AND CURETTAGE OF UTERUS    . MEDIASTINOSCOPY N/A 04/12/2015   Procedure: MEDIASTINOSCOPY;  Surgeon: Grace Isaac, MD;  Location: [place];  Service: Thoracic;  Laterality: N/A;  . VIDEO BRONCHOSCOPY Bilateral 03/18/2015   Procedure: VIDEO BRONCHOSCOPY WITH FLUORO;  Surgeon: Collene Gobble, MD;  Location: [place];  Service: Cardiopulmonary;  Laterality: Bilateral;  . VIDEO BRONCHOSCOPY WITH ENDOBRONCHIAL ULTRASOUND N/A 04/12/2015   Procedure: VIDEO  BRONCHOSCOPY WITH ENDOBRONCHIAL ULTRASOUND;  Surgeon: Grace Isaac, MD;  Location: [facility];  Service: Thoracic;  Laterality: N/A;    Allergies: Amoxicillin and Penicillins  Medications: Prior to Admission medications   Medication Sig Start Date End Date Taking? Authorizing Provider  albuterol (PROAIR HFA) 108 (90 Base) MCG/ACT inhaler Inhale 2 puffs into the lungs every 6 (six) hours as needed for wheezing or shortness of breath. 04/16/16  Yes Collene Gobble, MD  ALPRAZolam Duanne Moron) 0.5 MG tablet Take 0.5 mg by mouth 3 (three) times daily as needed for anxiety. 10/30/16  Yes [provider]  budesonide-formoterol (SYMBICORT) 160-4.5 MCG/ACT inhaler INHALE TWO PUFFS INTO LUNGS TWICE DAILY 02/08/15  Yes Byrum, Rose Fillers, MD  dorzolamide (TRUSOPT) 2 % ophthalmic solution INSTILL 2 DROPS INTO BOTH EYES TWICE A DAY 10/16/16  Yes Funches, Josalyn, MD  morphine (MSIR) 15 MG tablet Take 15 mg by mouth every 4 (four) hours as needed for pain. 10/30/16 10/30/17 Yes [provider]  Multiple Vitamins-Minerals (PRESERVISION AREDS 2 PO) Take 1 capsule by mouth daily. Reported on 07/05/2015   Yes [provider]  Pyridoxine HCl (B-6 PO) Take 1 tablet by mouth daily. Reported on 08/04/2015    [provider]  Spacer/Aero-Holding Chambers (AEROCHAMBER MV) inhaler Use as instructed Patient not taking: Reported on 06/18/2016 07/17/13   Collene Gobble, MD     Family History  Problem Relation Age of Onset  . Breast cancer Maternal Grandmother     Social History   Social History  . Marital status: Married    Spouse name: N/A  . Number of children: 4  . Years of education: N/A   Occupational History  . retired    Social History Main Topics  . Smoking status: Current Every Day Smoker    Packs/day: 1.00    Years: 50.00    Types: Cigarettes  . Smokeless tobacco: Never Used  . Alcohol use No  . Drug use: No  . Sexual activity: Not Asked   Other Topics Concern  . None     Social History Narrative  . None    Review of Systems: A 12 point ROS discussed and pertinent positives are indicated in the HPI above.  All other systems are negative.  Review of Systems  Constitutional: Positive for activity change, appetite change, fatigue and unexpected weight change. Negative for fever.  Respiratory: Positive for cough and shortness of breath.   Cardiovascular: Negative for chest pain.  Gastrointestinal: Negative for abdominal pain.  Musculoskeletal: Positive for back pain and gait problem.  Neurological: Positive for weakness.  Psychiatric/Behavioral: Negative for behavioral problems and confusion.    Vital Signs: BP (!) 158/88 (BP Location: Right Arm)   Pulse 94   Temp 97.8 F (36.6 C) (Oral)   Ht 5\' 4"  (1.626 m)   Wt 112 lb (50.8 kg)   SpO2 94%   BMI 19.22 kg/m   Physical Exam  Constitutional: She is oriented to person, place, and time.  Cardiovascular: Normal rate and regular rhythm.   Pulmonary/Chest: Breath sounds normal.  Abdominal: Soft. Bowel sounds are normal.  Musculoskeletal: Normal range of motion.  Diffuse back pain Pinpoint to Thoracic area  Neurological: She is alert  and oriented to person, place, and time.  Skin: Skin is warm and dry.  Psychiatric: She has a normal mood and affect. Her behavior is normal. Judgment and thought content normal.  Nursing note and vitals reviewed.   Mallampati Score:  MD Evaluation Airway: WNL Heart: WNL Abdomen: WNL Chest/ Lungs: WNL ASA  Classification: 3 Mallampati/Airway Score: One  Imaging: No results found.  Labs:  CBC:  Recent Labs  03/23/16 1114 08/31/16 1003 11/12/16 0728  WBC 7.8 6.5 6.8  HGB 13.1 13.7 13.0  HCT 39.4 40.3 38.7  PLT 274 310 326    COAGS:  Recent Labs  11/12/16 0728  INR 0.99  APTT 30    BMP:  Recent Labs  03/23/16 1114 08/31/16 1003 11/12/16 0728  NA 136 133* 135  K 4.3 4.5 3.7  CL  --   --  99*  CO2 29 30* 29  GLUCOSE 114 109 114*   BUN 6.8* 7.9 8  CALCIUM 9.4 9.6 8.8*  CREATININE 0.6 0.7 0.57  GFRNONAA  --   --  >60  GFRAA  --   --  >60    LIVER FUNCTION TESTS:  Recent Labs  03/23/16 1114 08/31/16 1003  BILITOT 0.31 0.38  AST 26 27  ALT 13 12  ALKPHOS 141 123  PROT 7.1 7.2  ALBUMIN 3.2* 3.4*    TUMOR MARKERS: No results for input(s): AFPTM, CEA, CA199, CHROMGRNA in the last 8760 hours.  Assessment and Plan:  Hx Lung Ca-- treatment with radiation 2016-2017 Chronic back pain Worsening over several weeks Acute fractures at Thoracic 4 and 5 Scheduled now for vertebroplasty/kyphoplasty T 4 and 5 Risks and benefits discussed with the patient including, but not limited to education regarding the natural healing process of compression fractures without intervention, bleeding, infection, cement migration which may cause spinal cord damage, paralysis, pulmonary embolism or even death. All of the patient's questions were answered, patient is agreeable to proceed. Consent signed and in chart.  Thank you for this interesting consult.  I greatly enjoyed meeting Amber Santiago and look forward to participating in their care.  A copy of this report was sent to the requesting provider on this date.  Electronically Signed: Lavonia Drafts, PA-C 11/12/2016, 8:31 AM   I spent a total of  30 Minutes   in face to face in clinical consultation, greater than 50% of which was counseling/coordinating care for T4 and 5 VP/KP

## 2016-11-12 NOTE — Sedation Documentation (Signed)
Pt tolerating procedure well, VS stable

## 2016-11-12 NOTE — Discharge Instructions (Signed)
1.No stooping ,bending or lifting more than 10 lbs for 2 weeks. 2.Use walker to ambulate  for 2 weeks. 3.RTC PRN in 2 weeks  KYPHOPLASTY/VERTEBROPLASTY DISCHARGE INSTRUCTIONS  Medications: (check all that apply)     Resume all home medications as before procedure.                    Continue your pain medications as prescribed as needed.  Over the next 3-5 days, decrease your pain medication as tolerated.  Over the counter medications (i.e. Tylenol, ibuprofen, and aleve) may be substituted once severe/moderate pain symptoms have subsided.   Wound Care: - Bandages may be removed the day following your procedure.  You may get your incision wet once bandages are removed.  Bandaids may be used to cover the incisions until scab formation.  Topical ointments are optional.  - If you develop a fever greater than 101 degrees, have increased skin redness at the incision sites or pus-like oozing from incisions occurring within 1 week of the procedure, contact radiology at [phone] or [phone].  - Ice pack to back for 15-20 minutes 2-3 time per day for first 2-3 days post procedure.  The ice will expedite muscle healing and help with the pain from the incisions.   Activity: - Bedrest today with limited activity for 24 hours post procedure.  - No driving for 48 hours.  - Increase your activity as tolerated after bedrest (with assistance if necessary).  - Refrain from any strenuous activity or heavy lifting (greater than 10 lbs.).   Follow up: - Contact radiology at [phone] or [phone] if any questions/concerns.  - A physician assistant from radiology will contact you in approximately 1 week.  - If a biopsy was performed at the time of your procedure, your referring physician should receive the results in usually 2-3 days.

## 2016-11-12 NOTE — Sedation Documentation (Signed)
dsg to upper back intact

## 2016-11-12 NOTE — Procedures (Signed)
S/p T4 and T5 VP with core biopsies

## 2016-11-13 ENCOUNTER — Encounter (HOSPITAL_COMMUNITY): Payer: Self-pay | Admitting: Interventional Radiology

## 2016-11-15 ENCOUNTER — Encounter (INDEPENDENT_AMBULATORY_CARE_PROVIDER_SITE_OTHER): Payer: Medicare Other | Admitting: Ophthalmology

## 2016-11-15 DIAGNOSIS — H353231 Exudative age-related macular degeneration, bilateral, with active choroidal neovascularization: Secondary | ICD-10-CM | POA: Diagnosis not present

## 2016-11-15 DIAGNOSIS — H2513 Age-related nuclear cataract, bilateral: Secondary | ICD-10-CM | POA: Diagnosis not present

## 2016-11-15 DIAGNOSIS — H43813 Vitreous degeneration, bilateral: Secondary | ICD-10-CM

## 2016-11-16 MED FILL — DORZOLAMIDE HCL 2% EYE DRP: 2 | 30 days supply | Qty: 10 | Fill #1

## 2016-11-16 MED FILL — BESIVANCE 0.6% SUSP: 0.6 | 30 days supply | Qty: 5 | Fill #2

## 2016-11-26 ENCOUNTER — Telehealth (HOSPITAL_COMMUNITY): Payer: Self-pay

## 2016-11-26 NOTE — Telephone Encounter (Signed)
Called to schedule f/u, left message for pt to return call. AW 

## 2016-11-30 ENCOUNTER — Encounter: Payer: Self-pay | Admitting: *Deleted

## 2016-11-30 NOTE — Progress Notes (Signed)
Oncology Nurse Navigator Documentation  Oncology Nurse Navigator Flowsheets 11/30/2016  Navigator Encounter Type Other/I received call from [facility] cancer program.  Pam, thoracic navigator states patient has an appt with them.  I updated Dr. Julien Nordmann.   Treatment Phase Other  Barriers/Navigation Needs Coordination of Care  Interventions Coordination of Care  Coordination of Care Other  Acuity Level 2  Time Spent with Patient 30

## 2016-12-03 ENCOUNTER — Ambulatory Visit: Payer: Medicare Other | Admitting: Internal Medicine

## 2016-12-03 ENCOUNTER — Other Ambulatory Visit: Payer: Medicare Other

## 2016-12-04 ENCOUNTER — Telehealth: Payer: Self-pay | Admitting: Internal Medicine

## 2016-12-04 NOTE — Telephone Encounter (Signed)
Per 8/24 sch msg - do not reschedule appts because they are currently receiving care at [facility]. Per Dr. Julien Nordmann

## 2016-12-20 ENCOUNTER — Encounter (INDEPENDENT_AMBULATORY_CARE_PROVIDER_SITE_OTHER): Payer: Medicare Other | Admitting: Ophthalmology

## 2016-12-24 ENCOUNTER — Encounter (INDEPENDENT_AMBULATORY_CARE_PROVIDER_SITE_OTHER): Payer: Medicare Other | Admitting: Ophthalmology

## 2016-12-24 DIAGNOSIS — H43813 Vitreous degeneration, bilateral: Secondary | ICD-10-CM

## 2016-12-24 DIAGNOSIS — H2513 Age-related nuclear cataract, bilateral: Secondary | ICD-10-CM | POA: Diagnosis not present

## 2016-12-24 DIAGNOSIS — H353231 Exudative age-related macular degeneration, bilateral, with active choroidal neovascularization: Secondary | ICD-10-CM

## 2016-12-31 MED FILL — **SPIRIVA RESPIMAT 1.25 MCG: 1.25 MCG | 15 days supply | Qty: 4 | Fill #0

## 2016-12-31 MED FILL — OMEPRAZOLE DR 40 MG CAPSULE: 40 | 30 days supply | Qty: 60 | Fill #0

## 2017-01-03 ENCOUNTER — Other Ambulatory Visit: Payer: Self-pay | Admitting: Hematology and Oncology

## 2017-01-03 DIAGNOSIS — S22009G Unspecified fracture of unspecified thoracic vertebra, subsequent encounter for fracture with delayed healing: Secondary | ICD-10-CM

## 2017-01-04 MED FILL — BESIVANCE 0.6% SUSP: 0.6 | 29 days supply | Qty: 5 | Fill #0

## 2017-01-08 ENCOUNTER — Telehealth: Payer: Self-pay | Admitting: Family Medicine

## 2017-01-08 NOTE — Telephone Encounter (Signed)
Pt daughter called to speak with the nurse or the pcp, is about her mother back pain, please follow up

## 2017-01-21 ENCOUNTER — Encounter: Payer: Self-pay | Admitting: Hematology and Oncology

## 2017-01-23 ENCOUNTER — Other Ambulatory Visit: Payer: Medicare Other

## 2017-01-28 ENCOUNTER — Encounter (INDEPENDENT_AMBULATORY_CARE_PROVIDER_SITE_OTHER): Payer: Medicare Other | Admitting: Ophthalmology

## 2017-01-28 DIAGNOSIS — H2513 Age-related nuclear cataract, bilateral: Secondary | ICD-10-CM | POA: Diagnosis not present

## 2017-01-28 DIAGNOSIS — H353231 Exudative age-related macular degeneration, bilateral, with active choroidal neovascularization: Secondary | ICD-10-CM | POA: Diagnosis not present

## 2017-01-28 DIAGNOSIS — H43813 Vitreous degeneration, bilateral: Secondary | ICD-10-CM

## 2017-01-29 ENCOUNTER — Other Ambulatory Visit: Payer: Medicare Other

## 2017-02-07 MED FILL — DORZOLAMIDE HCL 2% EYE DRP: 2 | 30 days supply | Qty: 10 | Fill #2

## 2017-02-22 ENCOUNTER — Other Ambulatory Visit: Payer: Self-pay | Admitting: Nurse Practitioner

## 2017-02-22 DIAGNOSIS — Z85118 Personal history of other malignant neoplasm of bronchus and lung: Secondary | ICD-10-CM

## 2017-02-22 DIAGNOSIS — Z7951 Long term (current) use of inhaled steroids: Secondary | ICD-10-CM

## 2017-02-26 ENCOUNTER — Other Ambulatory Visit: Payer: Medicare Other

## 2017-03-04 ENCOUNTER — Encounter (INDEPENDENT_AMBULATORY_CARE_PROVIDER_SITE_OTHER): Payer: Medicare Other | Admitting: Ophthalmology

## 2017-03-04 DIAGNOSIS — H2513 Age-related nuclear cataract, bilateral: Secondary | ICD-10-CM

## 2017-03-04 DIAGNOSIS — H353231 Exudative age-related macular degeneration, bilateral, with active choroidal neovascularization: Secondary | ICD-10-CM | POA: Diagnosis not present

## 2017-03-04 DIAGNOSIS — H43813 Vitreous degeneration, bilateral: Secondary | ICD-10-CM

## 2017-03-07 ENCOUNTER — Other Ambulatory Visit: Payer: Self-pay | Admitting: Nurse Practitioner

## 2017-03-07 ENCOUNTER — Other Ambulatory Visit: Payer: Medicare Other

## 2017-03-07 ENCOUNTER — Ambulatory Visit
Admission: RE | Admit: 2017-03-07 | Discharge: 2017-03-07 | Disposition: A | Discharge status: Home / Self Care | Payer: Medicare Other | Source: Ambulatory Visit | Attending: Nurse Practitioner | Admitting: Nurse Practitioner

## 2017-03-07 DIAGNOSIS — Z85118 Personal history of other malignant neoplasm of bronchus and lung: Secondary | ICD-10-CM

## 2017-03-07 MED ORDER — IOPAMIDOL (ISOVUE-300) INJECTION 61%
75.0000 mL | Freq: Once | INTRAVENOUS | Status: AC | PRN
  Administered 2017-03-07: 75 mL via INTRAVENOUS

## 2017-03-15 ENCOUNTER — Other Ambulatory Visit: Payer: Medicare Other

## 2017-03-29 ENCOUNTER — Encounter (INDEPENDENT_AMBULATORY_CARE_PROVIDER_SITE_OTHER): Payer: Medicare Other | Admitting: Ophthalmology

## 2017-03-29 MED FILL — DORZOLAMIDE HCL 2% EYE DRP: 2 | 30 days supply | Qty: 10 | Fill #3

## 2017-04-10 ENCOUNTER — Encounter (INDEPENDENT_AMBULATORY_CARE_PROVIDER_SITE_OTHER): Payer: Medicare Other | Admitting: Ophthalmology

## 2017-04-10 DIAGNOSIS — H353231 Exudative age-related macular degeneration, bilateral, with active choroidal neovascularization: Secondary | ICD-10-CM

## 2017-04-10 DIAGNOSIS — H2513 Age-related nuclear cataract, bilateral: Secondary | ICD-10-CM | POA: Diagnosis not present

## 2017-04-10 DIAGNOSIS — H43813 Vitreous degeneration, bilateral: Secondary | ICD-10-CM | POA: Diagnosis not present

## 2017-05-08 ENCOUNTER — Encounter (INDEPENDENT_AMBULATORY_CARE_PROVIDER_SITE_OTHER): Payer: Medicare Other | Admitting: Ophthalmology

## 2017-05-08 DIAGNOSIS — H353231 Exudative age-related macular degeneration, bilateral, with active choroidal neovascularization: Secondary | ICD-10-CM | POA: Diagnosis not present

## 2017-05-08 DIAGNOSIS — H2513 Age-related nuclear cataract, bilateral: Secondary | ICD-10-CM | POA: Diagnosis not present

## 2017-05-08 DIAGNOSIS — H43813 Vitreous degeneration, bilateral: Secondary | ICD-10-CM | POA: Diagnosis not present

## 2017-05-29 ENCOUNTER — Telehealth: Payer: Self-pay | Admitting: Emergency Medicine

## 2017-05-29 MED FILL — BESIVANCE 0.6% SUSP: 0.6 | 29 days supply | Qty: 5 | Fill #1

## 2017-05-29 NOTE — Telephone Encounter (Signed)
Called and spoke with pt's daughter, Amber Santiago [facility]) Amber Santiago is requesting Rx for Symbicort 160 to be sent to patient assistance.  pt was last seen 07/05/15.  I have made [name] aware that apt is needed prior to refilling this medication. [name] states she will contact pt's PCP regarding refills. [name] states she will contact our office to schedule an apt PCP is unable to refill this medication.   Nothing further is needed at this time.

## 2017-06-03 MED FILL — DORZOLAMIDE HCL 2% EYE DRP: 2 | 30 days supply | Qty: 10 | Fill #4

## 2017-06-05 ENCOUNTER — Encounter (INDEPENDENT_AMBULATORY_CARE_PROVIDER_SITE_OTHER): Payer: Medicare Other | Admitting: Ophthalmology

## 2017-06-05 DIAGNOSIS — H2513 Age-related nuclear cataract, bilateral: Secondary | ICD-10-CM | POA: Diagnosis not present

## 2017-06-05 DIAGNOSIS — H43813 Vitreous degeneration, bilateral: Secondary | ICD-10-CM | POA: Diagnosis not present

## 2017-06-05 DIAGNOSIS — H353231 Exudative age-related macular degeneration, bilateral, with active choroidal neovascularization: Secondary | ICD-10-CM

## 2017-06-28 MED FILL — DORZOLAMIDE HCL 2% EYE DRP: 2 | 30 days supply | Qty: 10 | Fill #0

## 2017-07-03 ENCOUNTER — Encounter (INDEPENDENT_AMBULATORY_CARE_PROVIDER_SITE_OTHER): Payer: Medicare Other | Admitting: Ophthalmology

## 2017-07-03 DIAGNOSIS — H353231 Exudative age-related macular degeneration, bilateral, with active choroidal neovascularization: Secondary | ICD-10-CM | POA: Diagnosis not present

## 2017-07-03 DIAGNOSIS — H43813 Vitreous degeneration, bilateral: Secondary | ICD-10-CM

## 2017-07-03 DIAGNOSIS — H2513 Age-related nuclear cataract, bilateral: Secondary | ICD-10-CM | POA: Diagnosis not present

## 2017-07-04 ENCOUNTER — Encounter (INDEPENDENT_AMBULATORY_CARE_PROVIDER_SITE_OTHER): Payer: Medicare Other | Admitting: Ophthalmology

## 2017-07-04 DIAGNOSIS — S0501XA Injury of conjunctiva and corneal abrasion without foreign body, right eye, initial encounter: Secondary | ICD-10-CM

## 2017-07-08 ENCOUNTER — Encounter (INDEPENDENT_AMBULATORY_CARE_PROVIDER_SITE_OTHER): Payer: Medicare Other | Admitting: Ophthalmology

## 2017-07-11 ENCOUNTER — Other Ambulatory Visit: Payer: Self-pay | Admitting: Hematology and Oncology

## 2017-07-11 DIAGNOSIS — M4854XA Collapsed vertebra, not elsewhere classified, thoracic region, initial encounter for fracture: Secondary | ICD-10-CM

## 2017-07-20 ENCOUNTER — Inpatient Hospital Stay
Admission: RE | Admit: 2017-07-20 | Discharge: 2017-07-20 | Disposition: A | Discharge status: Home / Self Care | Payer: Medicare Other | Source: Ambulatory Visit | Attending: Hematology and Oncology | Admitting: Hematology and Oncology

## 2017-08-07 ENCOUNTER — Encounter (INDEPENDENT_AMBULATORY_CARE_PROVIDER_SITE_OTHER): Payer: Medicare Other | Admitting: Ophthalmology

## 2017-08-07 DIAGNOSIS — H2513 Age-related nuclear cataract, bilateral: Secondary | ICD-10-CM

## 2017-08-07 DIAGNOSIS — H43813 Vitreous degeneration, bilateral: Secondary | ICD-10-CM | POA: Diagnosis not present

## 2017-08-07 DIAGNOSIS — H353231 Exudative age-related macular degeneration, bilateral, with active choroidal neovascularization: Secondary | ICD-10-CM | POA: Diagnosis not present

## 2017-08-11 ENCOUNTER — Ambulatory Visit
Admission: RE | Admit: 2017-08-11 | Discharge: 2017-08-11 | Disposition: A | Discharge status: Home / Self Care | Payer: Medicare Other | Source: Ambulatory Visit | Attending: Hematology and Oncology | Admitting: Hematology and Oncology

## 2017-08-11 DIAGNOSIS — M4854XA Collapsed vertebra, not elsewhere classified, thoracic region, initial encounter for fracture: Secondary | ICD-10-CM

## 2017-08-11 MED ORDER — GADOBENATE DIMEGLUMINE 529 MG/ML IV SOLN
10.0000 mL | Freq: Once | INTRAVENOUS | Status: AC | PRN
  Administered 2017-08-11: 10 mL via INTRAVENOUS

## 2017-08-23 ENCOUNTER — Ambulatory Visit: Payer: Medicare Other | Admitting: Family Medicine

## 2017-08-28 MED FILL — DORZOLAMIDE HCL 2% EYE DRP: 2 | 37 days supply | Qty: 10 | Fill #0

## 2017-08-30 ENCOUNTER — Encounter

## 2017-09-12 ENCOUNTER — Telehealth (HOSPITAL_COMMUNITY): Payer: Self-pay

## 2017-09-12 NOTE — Telephone Encounter (Signed)
Left message for Pam to return call regarding recent MRI. AW

## 2017-09-13 ENCOUNTER — Encounter (INDEPENDENT_AMBULATORY_CARE_PROVIDER_SITE_OTHER): Payer: Medicare Other | Admitting: Ophthalmology

## 2017-09-13 DIAGNOSIS — H35033 Hypertensive retinopathy, bilateral: Secondary | ICD-10-CM

## 2017-09-13 DIAGNOSIS — H2513 Age-related nuclear cataract, bilateral: Secondary | ICD-10-CM

## 2017-09-13 DIAGNOSIS — I10 Essential (primary) hypertension: Secondary | ICD-10-CM | POA: Diagnosis not present

## 2017-09-13 DIAGNOSIS — H353231 Exudative age-related macular degeneration, bilateral, with active choroidal neovascularization: Secondary | ICD-10-CM | POA: Diagnosis not present

## 2017-09-13 DIAGNOSIS — H43813 Vitreous degeneration, bilateral: Secondary | ICD-10-CM | POA: Diagnosis not present

## 2017-09-26 MED FILL — DORZOLAMIDE HCL 2% EYE DRP: 2 | 37 days supply | Qty: 10 | Fill #1

## 2017-10-01 IMAGING — PT NM PET TUM IMG INITIAL (PI) SKULL BASE T - THIGH
1 of 8 series · 1 of 25 positions shown · non-contrast
Comparison: CT scan from 02/15/2015

CLINICAL DATA: Initial treatment strategy for spiculated right lung
mass.

EXAM:
NUCLEAR MEDICINE PET SKULL BASE TO THIGH
TECHNIQUE: 8.3 mCi F-18 FDG was injected intravenously. Full-ring PET imaging
was performed from the skull base to thigh after the radiotracer. CT
data was obtained and used for attenuation correction and anatomic
localization.
FASTING BLOOD GLUCOSE:  Value: 99 mg/dl

[Series 4: ct sk_thigh 5.0 b31f · axial · 5.0mm · 0.84mm/px · 1 of 217 slices shown]
[im 217/217  brain]
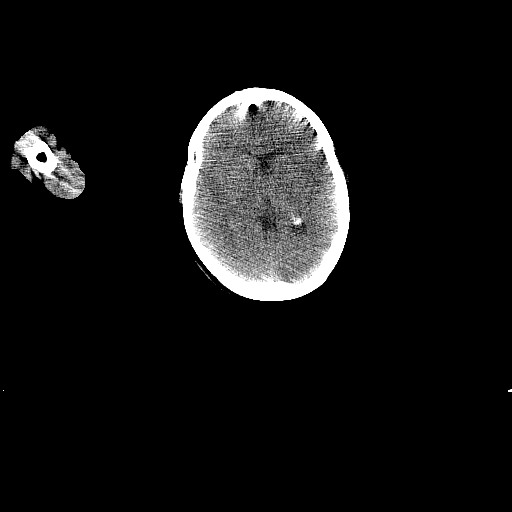

[1 of 25 positions shown; findings below may reference images not displayed]

FINDINGS: NECK

No hypermetabolic lymph nodes in the neck.

CHEST

[DATE] x 3.0 cm right upper lobe spiculated lung mass seen on the
recent diagnostic chest CT is markedly hypermetabolic with SUV max =
27 since the recent CT scan, there has been development of a a
satellite nodule measuring 9 mm (image 58 series 4) with SUV max =
3.9

Although no lymphadenopathy is seen in the right hilum or
mediastinum by size criteria on the uninfused CT images, there is
FDG uptake associated with a precarinal lymph node measuring 6 mm
short axis (image 66 series 4) and AP window lymph node also 6 mm in
short axis (image 65 series 4). Uptake in these lymph nodes is
scattered just above mediastinal levels. Microscopic disease within
these lymph nodes cannot be excluded.

Pleural-parenchymal scarring is seen in both lung apices without
hypermetabolism. 4 mm left upper lobe nodule on image 59 is stable
in the interval with no definite hypermetabolism on PET imaging
although the small size may be below the size threshold for reliable
resolution on PET imaging. No evidence for pleural effusion.

ABDOMEN/PELVIS

No abnormal hypermetabolic activity within the liver, pancreas,
adrenal glands, or spleen. No hypermetabolic lymph nodes in the
abdomen or pelvis.

There is abdominal aortic atherosclerosis without aneurysm.

SKELETON

No focal hypermetabolic activity to suggest skeletal metastasis.
IMPRESSION: 4 cm spiculated central right upper lobe mass is markedly
hypermetabolic consistent with malignancy. There is been interval
development of a satellite nodule which is also hypermetabolic
suggesting metastatic disease to the right lung.

Low level FDG uptake in unenlarged right hilar and mediastinal lymph
nodes. Microscopic disease is a possibility.

No evidence for hypermetabolic metastases in the neck, abdomen, or
pelvis.

Abdominal aortic atherosclerosis.

## 2017-10-18 ENCOUNTER — Encounter (INDEPENDENT_AMBULATORY_CARE_PROVIDER_SITE_OTHER): Payer: Medicare Other | Admitting: Ophthalmology

## 2017-10-18 DIAGNOSIS — H353231 Exudative age-related macular degeneration, bilateral, with active choroidal neovascularization: Secondary | ICD-10-CM

## 2017-10-18 DIAGNOSIS — H43813 Vitreous degeneration, bilateral: Secondary | ICD-10-CM

## 2017-10-18 DIAGNOSIS — H35033 Hypertensive retinopathy, bilateral: Secondary | ICD-10-CM

## 2017-10-18 DIAGNOSIS — I10 Essential (primary) hypertension: Secondary | ICD-10-CM

## 2017-10-28 MED FILL — DORZOLAMIDE HCL 2% EYE DRP: 2 | 37 days supply | Qty: 10 | Fill #2

## 2017-10-30 MED FILL — KETOROLAC 0.4% OPHTH SOLN: 0.4 | 20 days supply | Qty: 5 | Fill #0

## 2017-10-30 MED FILL — OFLOXACIN 0.3% EYE DROPS: 0.3 | 20 days supply | Qty: 5 | Fill #0

## 2017-11-16 IMAGING — CR DG CHEST 1V PORT
1 series · 1 of 1 positions shown · non-contrast
Comparison: Study obtained earlier in the day

CLINICAL DATA: Postoperative right lung biopsy

EXAM:
PORTABLE CHEST 1 VIEW

[AP]
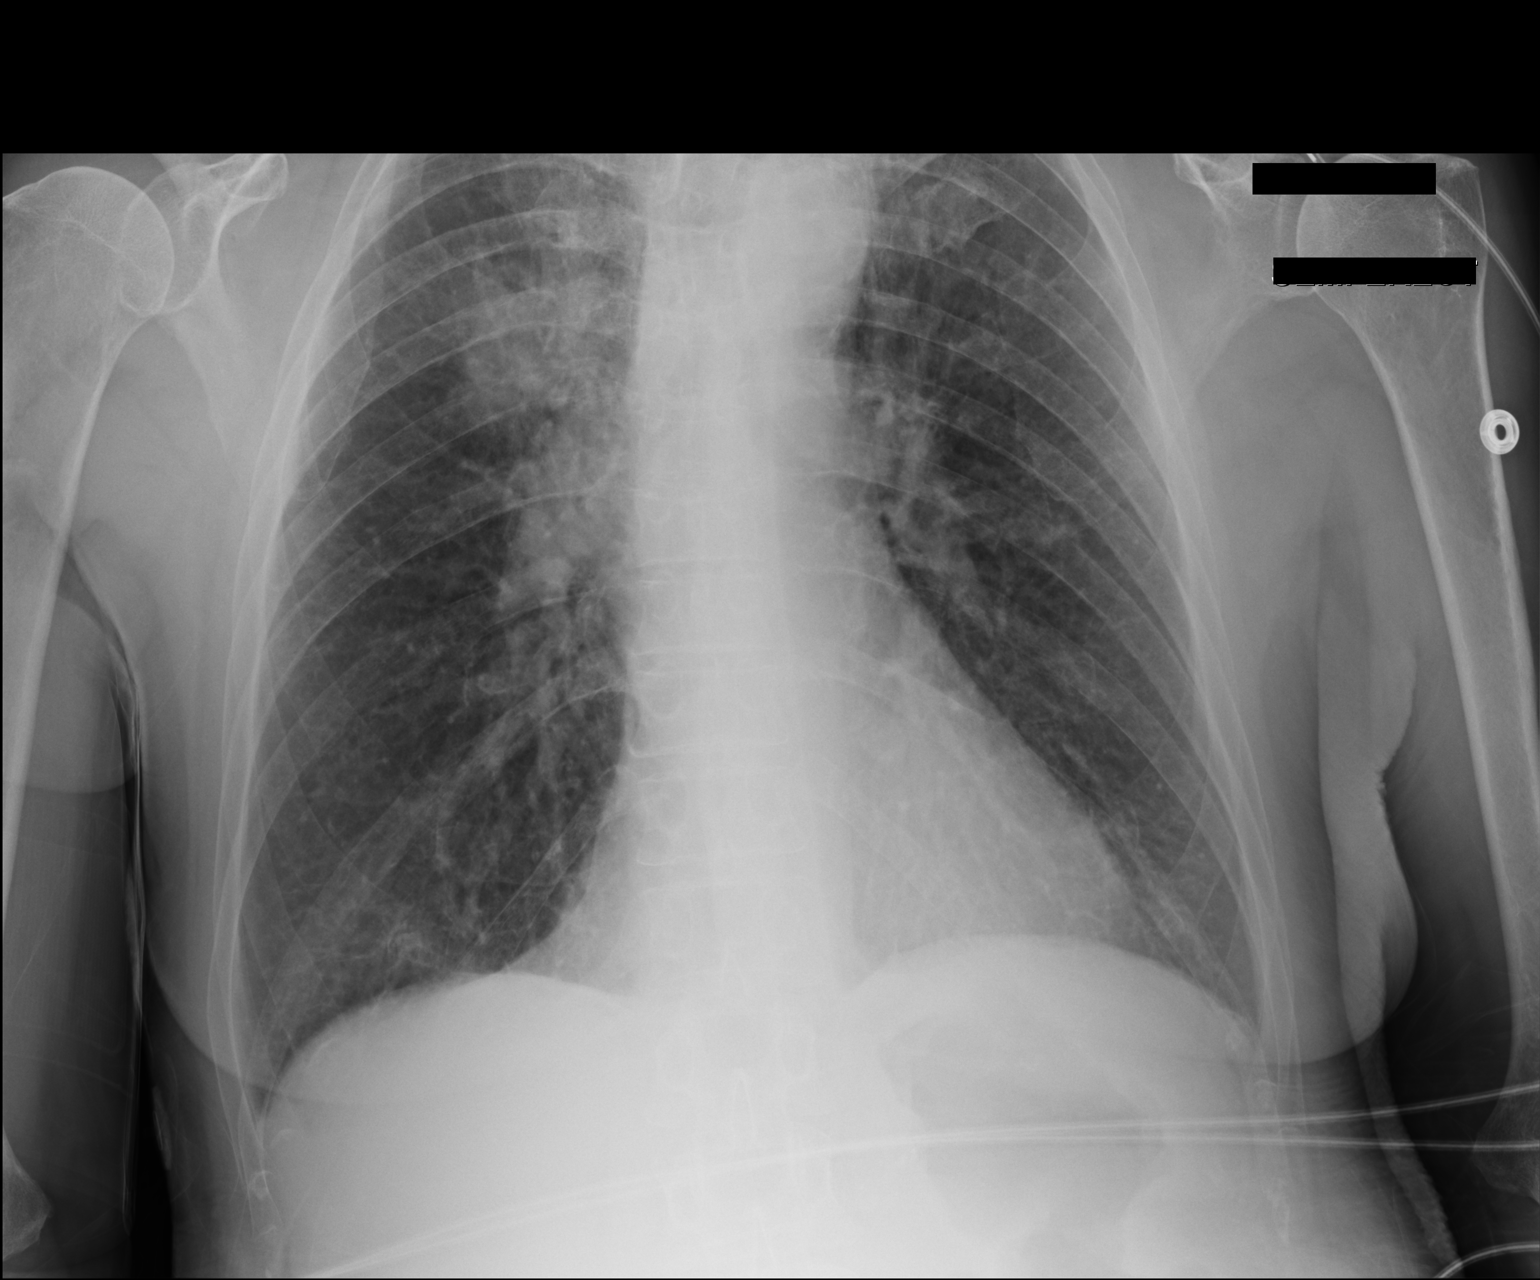

[1 of 1 positions shown; findings below may reference images not displayed]

FINDINGS: No pneumothorax. The mass in the right upper lobe is again noted
with mild surrounding interstitial prominence, likely hemorrhage in
the acute post biopsy setting. There is no new mass. The lungs are
somewhat hyperexpanded. Left lung is clear. Heart size is normal.
Pulmonary vascularity is normal. No adenopathy apparent
radiographically.
IMPRESSION: Suspect hemorrhage in the region of the right upper lobe biopsy. The
mass in the right upper lobe is again noted without appreciable
change. No pneumothorax. Left lung clear. Lungs are hyperexpanded.
No change in cardiac silhouette.

## 2017-11-16 IMAGING — CR DG CHEST 2V
2 series · 2 of 2 positions shown · non-contrast
Comparison: Chest radiograph from 03/18/2015

CLINICAL DATA: Anterior lower rib pain, acute onset. Initial
encounter.

EXAM:
CHEST  2 VIEW

[w chest pa]
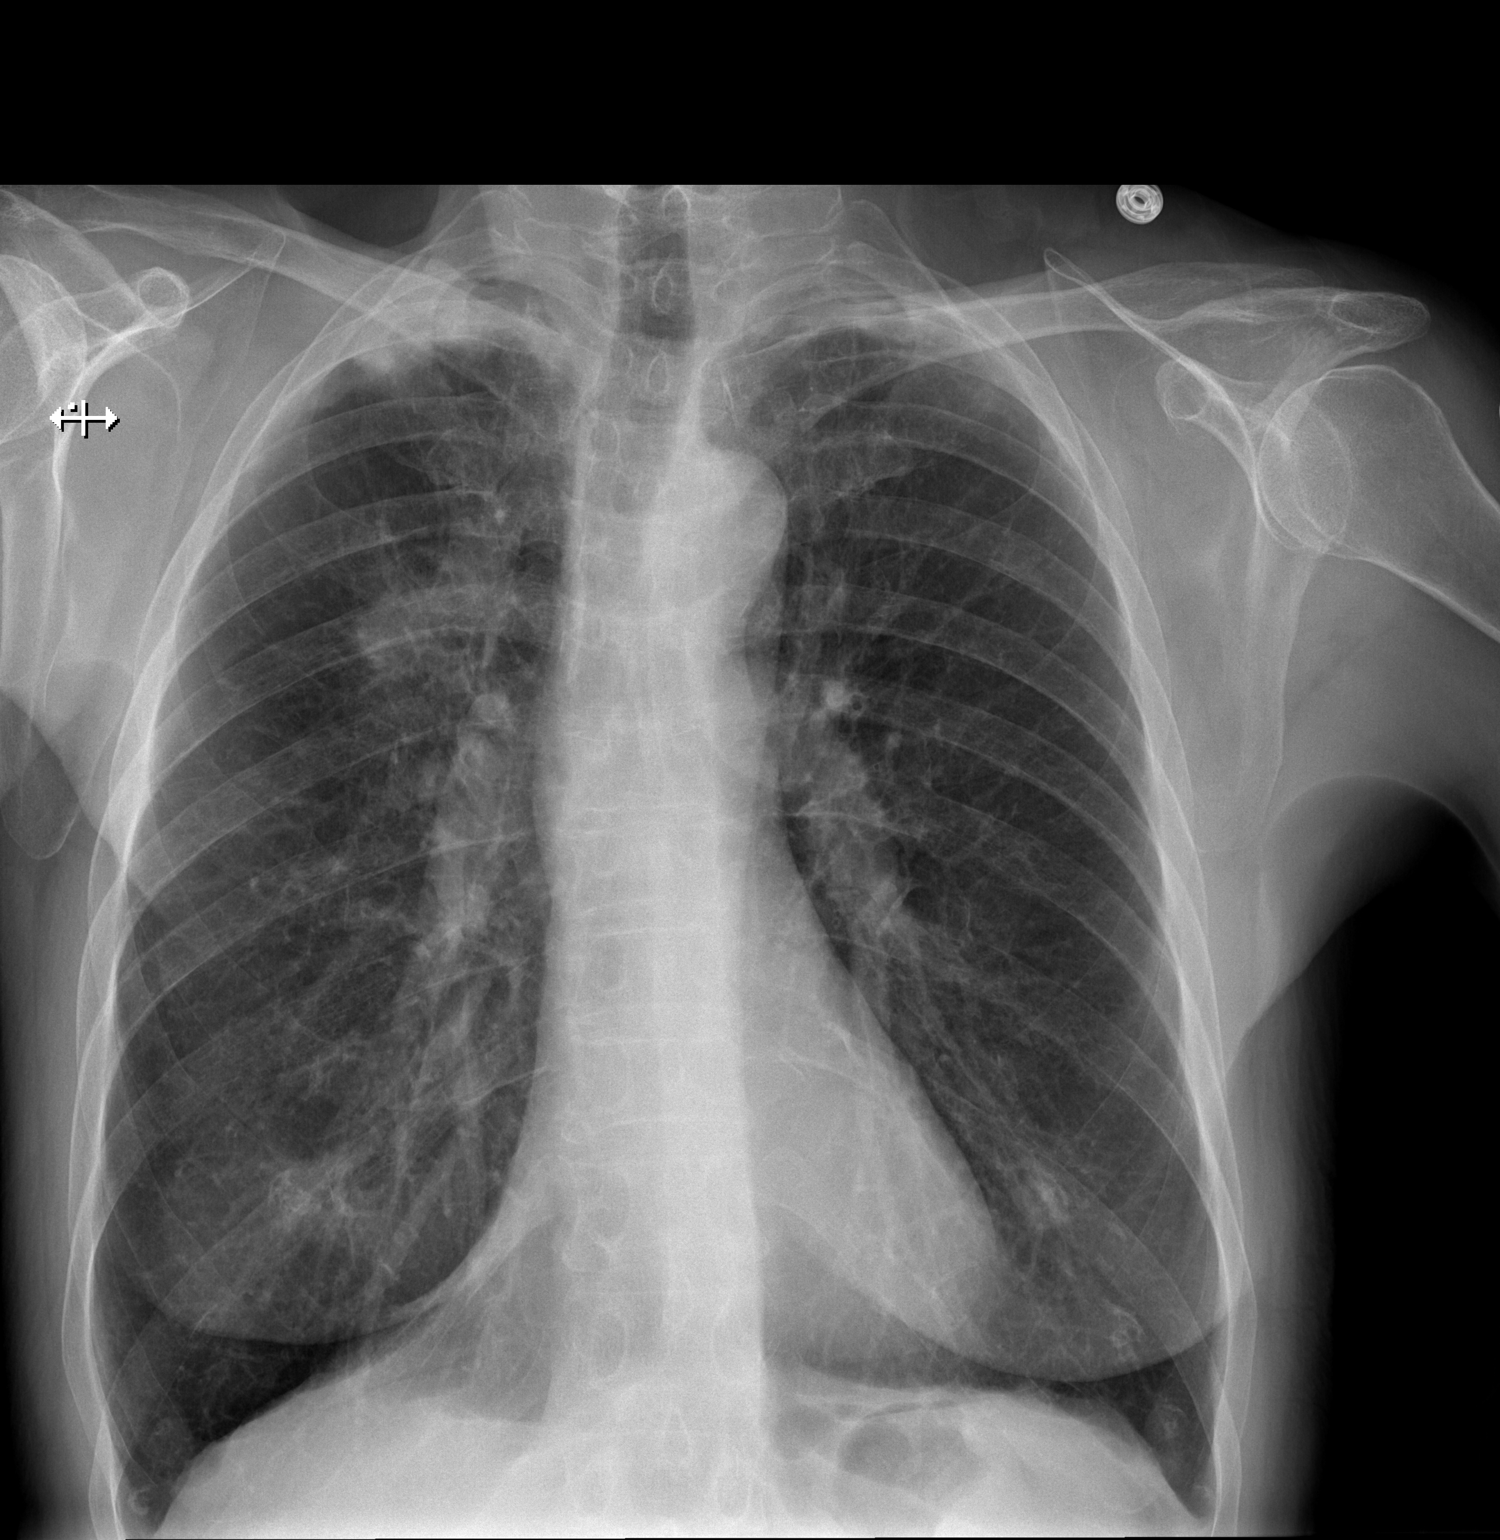

[w chest lat]
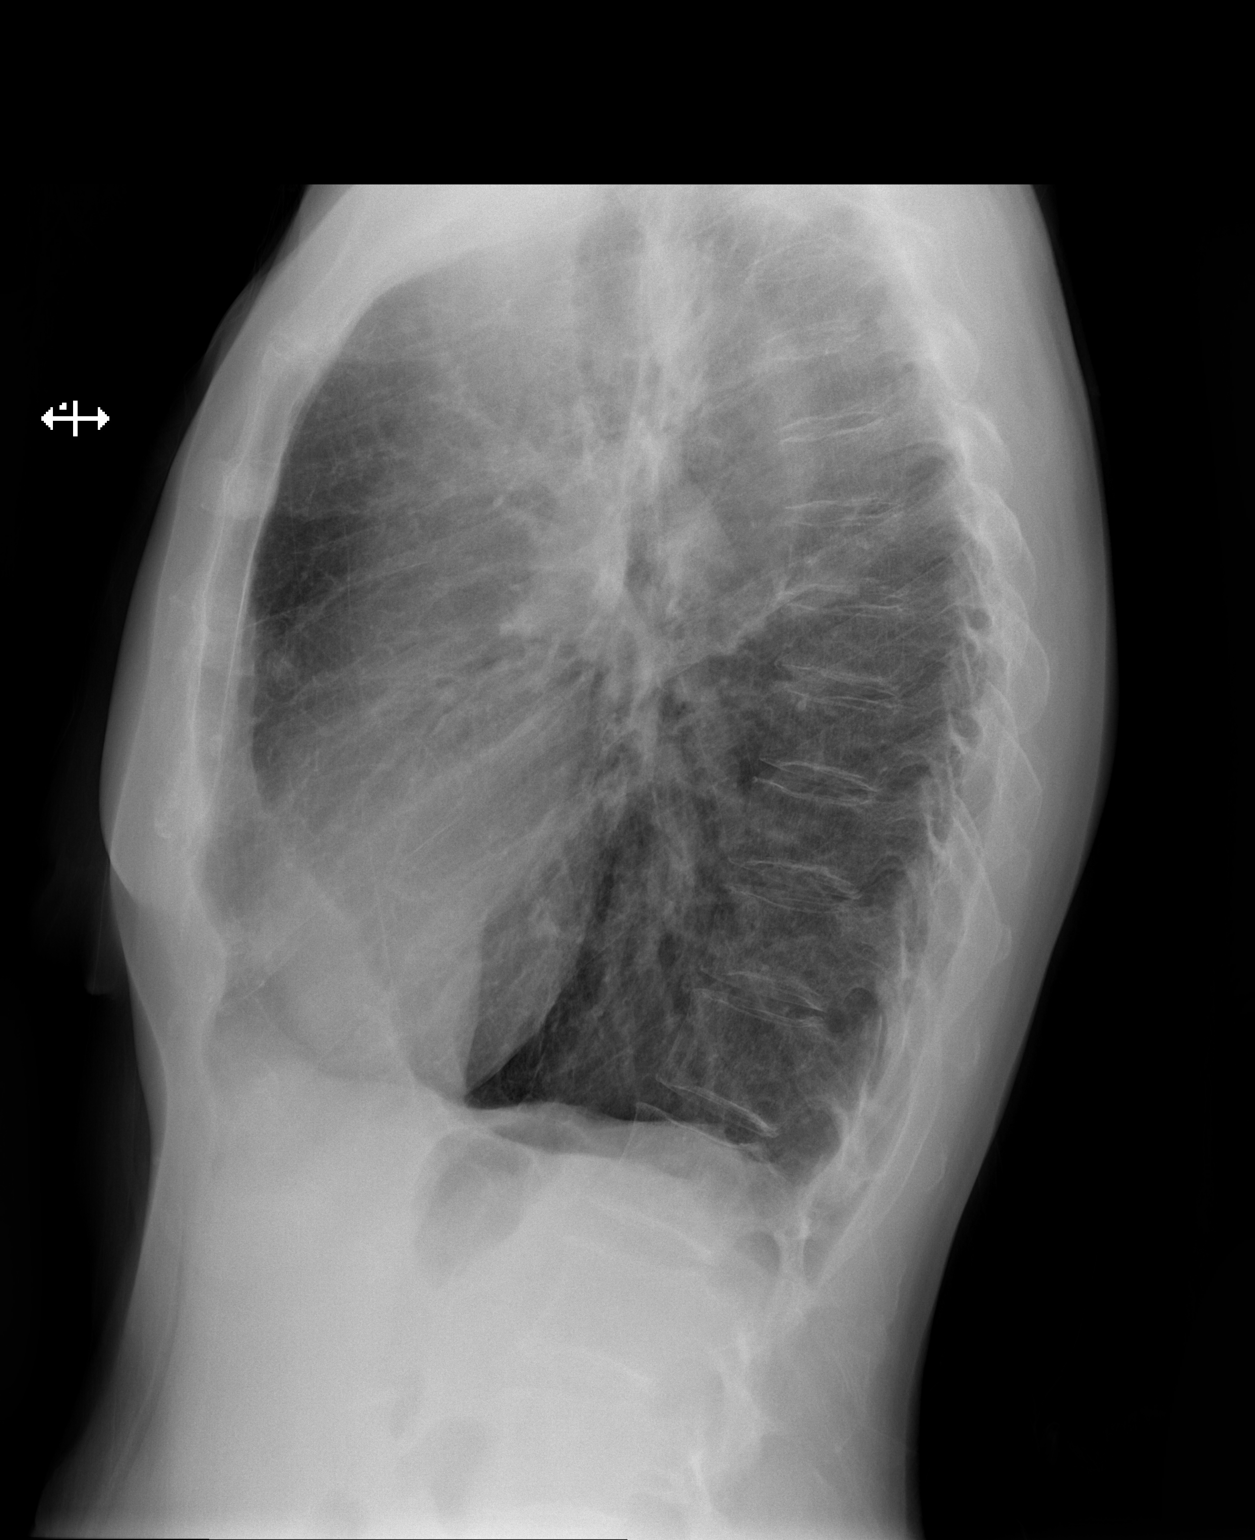

[2 of 2 positions shown; findings below may reference images not displayed]

FINDINGS: The lungs are hyperexpanded, with flattening of the hemidiaphragms,
compatible with COPD. The right suprahilar mass is again noted,
measuring perhaps 4.8 cm, with biapical scarring. There is no
evidence of pleural effusion or pneumothorax.

The heart is normal in size; the mediastinal contour is within
normal limits. No acute osseous abnormalities are seen.
IMPRESSION: Right suprahilar mass again noted.  Underlying findings of COPD.

## 2017-11-22 ENCOUNTER — Encounter (INDEPENDENT_AMBULATORY_CARE_PROVIDER_SITE_OTHER): Payer: Medicare Other | Admitting: Ophthalmology

## 2017-11-22 DIAGNOSIS — I10 Essential (primary) hypertension: Secondary | ICD-10-CM | POA: Diagnosis not present

## 2017-11-22 DIAGNOSIS — H353231 Exudative age-related macular degeneration, bilateral, with active choroidal neovascularization: Secondary | ICD-10-CM

## 2017-11-22 DIAGNOSIS — H35033 Hypertensive retinopathy, bilateral: Secondary | ICD-10-CM | POA: Diagnosis not present

## 2017-11-22 DIAGNOSIS — H43813 Vitreous degeneration, bilateral: Secondary | ICD-10-CM | POA: Diagnosis not present

## 2017-11-22 DIAGNOSIS — H2513 Age-related nuclear cataract, bilateral: Secondary | ICD-10-CM

## 2017-12-20 ENCOUNTER — Encounter (INDEPENDENT_AMBULATORY_CARE_PROVIDER_SITE_OTHER): Payer: Medicare Other | Admitting: Ophthalmology

## 2017-12-23 ENCOUNTER — Encounter (INDEPENDENT_AMBULATORY_CARE_PROVIDER_SITE_OTHER): Payer: Medicare Other | Admitting: Ophthalmology

## 2017-12-24 MED FILL — DORZOLAMIDE HCL 2% EYE DRP: 2 | 37 days supply | Qty: 10 | Fill #3

## 2017-12-25 ENCOUNTER — Encounter (INDEPENDENT_AMBULATORY_CARE_PROVIDER_SITE_OTHER): Payer: Medicare Other | Admitting: Ophthalmology

## 2018-01-01 ENCOUNTER — Encounter (INDEPENDENT_AMBULATORY_CARE_PROVIDER_SITE_OTHER): Payer: Medicare Other | Admitting: Ophthalmology

## 2018-01-01 DIAGNOSIS — H43813 Vitreous degeneration, bilateral: Secondary | ICD-10-CM

## 2018-01-01 DIAGNOSIS — H35033 Hypertensive retinopathy, bilateral: Secondary | ICD-10-CM | POA: Diagnosis not present

## 2018-01-01 DIAGNOSIS — I10 Essential (primary) hypertension: Secondary | ICD-10-CM

## 2018-01-01 DIAGNOSIS — H353231 Exudative age-related macular degeneration, bilateral, with active choroidal neovascularization: Secondary | ICD-10-CM

## 2018-01-06 ENCOUNTER — Encounter (INDEPENDENT_AMBULATORY_CARE_PROVIDER_SITE_OTHER): Payer: Medicare Other | Admitting: Ophthalmology

## 2018-01-24 MED FILL — IPRAT-ALBUT 0.5-3(2.5) MG/3: 0.5-2.5 (3) | 5 days supply | Qty: 90 | Fill #0

## 2018-01-29 ENCOUNTER — Encounter (INDEPENDENT_AMBULATORY_CARE_PROVIDER_SITE_OTHER): Payer: Medicare Other | Admitting: Ophthalmology

## 2018-01-29 DIAGNOSIS — H35033 Hypertensive retinopathy, bilateral: Secondary | ICD-10-CM

## 2018-01-29 DIAGNOSIS — H353231 Exudative age-related macular degeneration, bilateral, with active choroidal neovascularization: Secondary | ICD-10-CM | POA: Diagnosis not present

## 2018-01-29 DIAGNOSIS — H2511 Age-related nuclear cataract, right eye: Secondary | ICD-10-CM | POA: Diagnosis not present

## 2018-01-29 DIAGNOSIS — I10 Essential (primary) hypertension: Secondary | ICD-10-CM | POA: Diagnosis not present

## 2018-02-26 MED FILL — DORZOLAMIDE HCL 2% EYE DRP: 2 | 37 days supply | Qty: 10 | Fill #4

## 2018-02-26 MED FILL — IPRAT-ALBUT 0.5-3(2.5) MG/3: 0.5-2.5 (3) | 5 days supply | Qty: 90 | Fill #1

## 2018-02-27 ENCOUNTER — Encounter (INDEPENDENT_AMBULATORY_CARE_PROVIDER_SITE_OTHER): Payer: Medicare Other | Admitting: Ophthalmology

## 2018-02-27 DIAGNOSIS — H35033 Hypertensive retinopathy, bilateral: Secondary | ICD-10-CM | POA: Diagnosis not present

## 2018-02-27 DIAGNOSIS — H353211 Exudative age-related macular degeneration, right eye, with active choroidal neovascularization: Secondary | ICD-10-CM

## 2018-02-27 DIAGNOSIS — H353122 Nonexudative age-related macular degeneration, left eye, intermediate dry stage: Secondary | ICD-10-CM | POA: Diagnosis not present

## 2018-02-27 DIAGNOSIS — I10 Essential (primary) hypertension: Secondary | ICD-10-CM | POA: Diagnosis not present

## 2018-02-27 DIAGNOSIS — H43813 Vitreous degeneration, bilateral: Secondary | ICD-10-CM

## 2018-03-24 MED FILL — DORZOLAMIDE HCL 2% EYE DRP: 2 | 37 days supply | Qty: 10 | Fill #5

## 2018-03-27 ENCOUNTER — Encounter (INDEPENDENT_AMBULATORY_CARE_PROVIDER_SITE_OTHER): Payer: Medicare Other | Admitting: Ophthalmology

## 2018-03-27 MED FILL — BESIVANCE 0.6% SUSP: 0.6 | 18 days supply | Qty: 5 | Fill #0

## 2018-03-28 ENCOUNTER — Encounter (INDEPENDENT_AMBULATORY_CARE_PROVIDER_SITE_OTHER): Payer: Medicare Other | Admitting: Ophthalmology

## 2018-03-28 DIAGNOSIS — H353231 Exudative age-related macular degeneration, bilateral, with active choroidal neovascularization: Secondary | ICD-10-CM | POA: Diagnosis not present

## 2018-03-28 DIAGNOSIS — H2511 Age-related nuclear cataract, right eye: Secondary | ICD-10-CM

## 2018-03-28 DIAGNOSIS — H35033 Hypertensive retinopathy, bilateral: Secondary | ICD-10-CM | POA: Diagnosis not present

## 2018-03-28 DIAGNOSIS — I10 Essential (primary) hypertension: Secondary | ICD-10-CM

## 2018-03-28 DIAGNOSIS — H43813 Vitreous degeneration, bilateral: Secondary | ICD-10-CM

## 2018-04-15 MED FILL — LOTEMAX 0.5% GEL: 0.5 | 18 days supply | Qty: 5 | Fill #0

## 2018-04-25 ENCOUNTER — Telehealth: Payer: Self-pay | Admitting: Emergency Medicine

## 2018-04-25 ENCOUNTER — Encounter (INDEPENDENT_AMBULATORY_CARE_PROVIDER_SITE_OTHER): Payer: Medicare HMO | Admitting: Ophthalmology

## 2018-04-25 DIAGNOSIS — H353231 Exudative age-related macular degeneration, bilateral, with active choroidal neovascularization: Secondary | ICD-10-CM

## 2018-04-25 DIAGNOSIS — H35033 Hypertensive retinopathy, bilateral: Secondary | ICD-10-CM | POA: Diagnosis not present

## 2018-04-25 DIAGNOSIS — H43813 Vitreous degeneration, bilateral: Secondary | ICD-10-CM

## 2018-04-25 DIAGNOSIS — I10 Essential (primary) hypertension: Secondary | ICD-10-CM

## 2018-04-25 NOTE — Telephone Encounter (Signed)
Called and spoke with patients daughter, this is the wrong patient that the note was documented on. Will close this encounter.

## 2018-05-22 ENCOUNTER — Encounter (INDEPENDENT_AMBULATORY_CARE_PROVIDER_SITE_OTHER): Payer: Medicare HMO | Admitting: Ophthalmology

## 2018-05-22 DIAGNOSIS — H43813 Vitreous degeneration, bilateral: Secondary | ICD-10-CM

## 2018-05-22 DIAGNOSIS — H353231 Exudative age-related macular degeneration, bilateral, with active choroidal neovascularization: Secondary | ICD-10-CM

## 2018-05-22 DIAGNOSIS — H35033 Hypertensive retinopathy, bilateral: Secondary | ICD-10-CM

## 2018-05-22 DIAGNOSIS — I10 Essential (primary) hypertension: Secondary | ICD-10-CM | POA: Diagnosis not present

## 2018-05-27 ENCOUNTER — Inpatient Hospital Stay (HOSPITAL_BASED_OUTPATIENT_CLINIC_OR_DEPARTMENT_OTHER)
Admission: EM | Admit: 2018-05-27 | Discharge: 2018-05-30 | DRG: 190 | Disposition: A | Discharge status: Home Health | Payer: Medicare HMO | Attending: Internal Medicine | Admitting: Internal Medicine

## 2018-05-27 ENCOUNTER — Encounter (HOSPITAL_BASED_OUTPATIENT_CLINIC_OR_DEPARTMENT_OTHER): Payer: Self-pay

## 2018-05-27 ENCOUNTER — Emergency Department (HOSPITAL_BASED_OUTPATIENT_CLINIC_OR_DEPARTMENT_OTHER): Payer: Medicare HMO

## 2018-05-27 ENCOUNTER — Other Ambulatory Visit: Payer: Self-pay

## 2018-05-27 DIAGNOSIS — C3490 Malignant neoplasm of unspecified part of unspecified bronchus or lung: Secondary | ICD-10-CM | POA: Diagnosis present

## 2018-05-27 DIAGNOSIS — M1990 Unspecified osteoarthritis, unspecified site: Secondary | ICD-10-CM | POA: Diagnosis present

## 2018-05-27 DIAGNOSIS — Z515 Encounter for palliative care: Secondary | ICD-10-CM | POA: Diagnosis not present

## 2018-05-27 DIAGNOSIS — Z803 Family history of malignant neoplasm of breast: Secondary | ICD-10-CM | POA: Diagnosis not present

## 2018-05-27 DIAGNOSIS — Z85118 Personal history of other malignant neoplasm of bronchus and lung: Secondary | ICD-10-CM | POA: Diagnosis not present

## 2018-05-27 DIAGNOSIS — Z9981 Dependence on supplemental oxygen: Secondary | ICD-10-CM

## 2018-05-27 DIAGNOSIS — H3530 Unspecified macular degeneration: Secondary | ICD-10-CM | POA: Diagnosis present

## 2018-05-27 DIAGNOSIS — Z880 Allergy status to penicillin: Secondary | ICD-10-CM | POA: Diagnosis not present

## 2018-05-27 DIAGNOSIS — Z79899 Other long term (current) drug therapy: Secondary | ICD-10-CM | POA: Diagnosis not present

## 2018-05-27 DIAGNOSIS — I890 Lymphedema, not elsewhere classified: Secondary | ICD-10-CM

## 2018-05-27 DIAGNOSIS — H9190 Unspecified hearing loss, unspecified ear: Secondary | ICD-10-CM | POA: Diagnosis present

## 2018-05-27 DIAGNOSIS — J9621 Acute and chronic respiratory failure with hypoxia: Secondary | ICD-10-CM

## 2018-05-27 DIAGNOSIS — Z888 Allergy status to other drugs, medicaments and biological substances status: Secondary | ICD-10-CM

## 2018-05-27 DIAGNOSIS — E0590 Thyrotoxicosis, unspecified without thyrotoxic crisis or storm: Secondary | ICD-10-CM | POA: Diagnosis present

## 2018-05-27 DIAGNOSIS — I714 Abdominal aortic aneurysm, without rupture: Secondary | ICD-10-CM | POA: Diagnosis present

## 2018-05-27 DIAGNOSIS — F17200 Nicotine dependence, unspecified, uncomplicated: Secondary | ICD-10-CM | POA: Diagnosis not present

## 2018-05-27 DIAGNOSIS — F419 Anxiety disorder, unspecified: Secondary | ICD-10-CM | POA: Diagnosis present

## 2018-05-27 DIAGNOSIS — J441 Chronic obstructive pulmonary disease with (acute) exacerbation: Principal | ICD-10-CM | POA: Diagnosis present

## 2018-05-27 DIAGNOSIS — I878 Other specified disorders of veins: Secondary | ICD-10-CM | POA: Diagnosis present

## 2018-05-27 DIAGNOSIS — Z7189 Other specified counseling: Secondary | ICD-10-CM

## 2018-05-27 DIAGNOSIS — E871 Hypo-osmolality and hyponatremia: Secondary | ICD-10-CM | POA: Diagnosis present

## 2018-05-27 DIAGNOSIS — Z7951 Long term (current) use of inhaled steroids: Secondary | ICD-10-CM | POA: Diagnosis not present

## 2018-05-27 DIAGNOSIS — R0789 Other chest pain: Secondary | ICD-10-CM

## 2018-05-27 DIAGNOSIS — G4700 Insomnia, unspecified: Secondary | ICD-10-CM | POA: Diagnosis present

## 2018-05-27 DIAGNOSIS — Z923 Personal history of irradiation: Secondary | ICD-10-CM

## 2018-05-27 DIAGNOSIS — E861 Hypovolemia: Secondary | ICD-10-CM | POA: Diagnosis present

## 2018-05-27 DIAGNOSIS — F17210 Nicotine dependence, cigarettes, uncomplicated: Secondary | ICD-10-CM | POA: Diagnosis present

## 2018-05-27 DIAGNOSIS — C3491 Malignant neoplasm of unspecified part of right bronchus or lung: Secondary | ICD-10-CM | POA: Diagnosis not present

## 2018-05-27 DIAGNOSIS — I10 Essential (primary) hypertension: Secondary | ICD-10-CM | POA: Diagnosis present

## 2018-05-27 HISTORY — DX: Abdominal aortic aneurysm, without rupture: I71.4

## 2018-05-27 HISTORY — DX: Abdominal aortic aneurysm, without rupture, unspecified: I71.40

## 2018-05-27 LAB — POCT I-STAT EG7
Acid-Base Excess: 4 mmol/L — ABNORMAL HIGH (ref 0.0–2.0)
Bicarbonate: 31 mmol/L — ABNORMAL HIGH (ref 20.0–28.0)
Calcium, Ion: 1.08 mmol/L — ABNORMAL LOW (ref 1.15–1.40)
HCT: 40 % (ref 36.0–46.0)
Hemoglobin: 13.6 g/dL (ref 12.0–15.0)
O2 Saturation: 66 %
Potassium: 3.8 mmol/L (ref 3.5–5.1)
Sodium: 126 mmol/L — ABNORMAL LOW (ref 135–145)
TCO2: 33 mmol/L — ABNORMAL HIGH (ref 22–32)
pCO2, Ven: 53.7 mmHg (ref 44.0–60.0)
pH, Ven: 7.37 (ref 7.250–7.430)
pO2, Ven: 36 mmHg (ref 32.0–45.0)

## 2018-05-27 LAB — COMPREHENSIVE METABOLIC PANEL
ALT: 19 U/L (ref 0–44)
ANION GAP: 10 (ref 5–15)
AST: 42 U/L — ABNORMAL HIGH (ref 15–41)
Albumin: 3.8 g/dL (ref 3.5–5.0)
Alkaline Phosphatase: 120 U/L (ref 38–126)
BILIRUBIN TOTAL: 0.8 mg/dL (ref 0.3–1.2)
BUN: 7 mg/dL — AB (ref 8–23)
CHLORIDE: 87 mmol/L — AB (ref 98–111)
CO2: 30 mmol/L (ref 22–32)
Calcium: 8.4 mg/dL — ABNORMAL LOW (ref 8.9–10.3)
Creatinine, Ser: 0.36 mg/dL — ABNORMAL LOW (ref 0.44–1.00)
Glucose, Bld: 98 mg/dL (ref 70–99)
Potassium: 3.7 mmol/L (ref 3.5–5.1)
Sodium: 127 mmol/L — ABNORMAL LOW (ref 135–145)
TOTAL PROTEIN: 7.5 g/dL (ref 6.5–8.1)

## 2018-05-27 LAB — CBC WITH DIFFERENTIAL/PLATELET
Abs Immature Granulocytes: 0.02 10*3/uL (ref 0.00–0.07)
BASOS PCT: 0 %
Basophils Absolute: 0 10*3/uL (ref 0.0–0.1)
EOS ABS: 0 10*3/uL (ref 0.0–0.5)
EOS PCT: 0 %
HEMATOCRIT: 39.3 % (ref 36.0–46.0)
Hemoglobin: 12.5 g/dL (ref 12.0–15.0)
Immature Granulocytes: 0 %
LYMPHS ABS: 0.4 10*3/uL — AB (ref 0.7–4.0)
Lymphocytes Relative: 4 %
MCH: 28.1 pg (ref 26.0–34.0)
MCHC: 31.8 g/dL (ref 30.0–36.0)
MCV: 88.3 fL (ref 80.0–100.0)
MONOS PCT: 6 %
Monocytes Absolute: 0.5 10*3/uL (ref 0.1–1.0)
Neutro Abs: 7.5 10*3/uL (ref 1.7–7.7)
Neutrophils Relative %: 90 %
PLATELETS: 271 10*3/uL (ref 150–400)
RBC: 4.45 MIL/uL (ref 3.87–5.11)
RDW: 14 % (ref 11.5–15.5)
WBC: 8.5 10*3/uL (ref 4.0–10.5)
nRBC: 0 % (ref 0.0–0.2)

## 2018-05-27 LAB — TROPONIN I

## 2018-05-27 MED ORDER — IPRATROPIUM BROMIDE 0.02 % IN SOLN
1.0000 mg | Freq: Once | RESPIRATORY_TRACT | Status: AC
  Administered 2018-05-27: 1 mg via RESPIRATORY_TRACT
  Filled 2018-05-27: qty 5

## 2018-05-27 MED ORDER — ALBUTEROL (5 MG/ML) CONTINUOUS INHALATION SOLN
15.0000 mg/h | INHALATION_SOLUTION | Freq: Once | RESPIRATORY_TRACT | Status: AC
  Administered 2018-05-27: 15 mg/h via RESPIRATORY_TRACT
  Filled 2018-05-27: qty 20

## 2018-05-27 MED ORDER — MAGNESIUM SULFATE 2 GM/50ML IV SOLN
2.0000 g | Freq: Once | INTRAVENOUS | Status: AC
  Administered 2018-05-27: 2 g via INTRAVENOUS
  Filled 2018-05-27: qty 50

## 2018-05-27 MED ORDER — ACETAMINOPHEN 325 MG PO TABS: 650.0000 mg | ORAL_TABLET | Freq: Four times a day (QID) | ORAL | Status: DC | PRN

## 2018-05-27 MED ORDER — GUAIFENESIN-DM 100-10 MG/5ML PO SYRP
10.0000 mL | ORAL_SOLUTION | ORAL | Status: DC | PRN
  Administered 2018-05-29: 10 mL via ORAL
  Filled 2018-05-27: qty 10

## 2018-05-27 MED ORDER — SODIUM CHLORIDE 0.9 % IV SOLN
Freq: Once | INTRAVENOUS | Status: AC
  Administered 2018-05-27: 19:00:00 via INTRAVENOUS

## 2018-05-27 MED ORDER — METHYLPREDNISOLONE SODIUM SUCC 125 MG IJ SOLR
125.0000 mg | Freq: Once | INTRAMUSCULAR | Status: AC
  Administered 2018-05-27: 125 mg via INTRAVENOUS
  Filled 2018-05-27: qty 2

## 2018-05-27 MED ORDER — ONDANSETRON HCL 4 MG PO TABS: 4.0000 mg | ORAL_TABLET | Freq: Four times a day (QID) | ORAL | Status: DC | PRN

## 2018-05-27 MED ORDER — ENOXAPARIN SODIUM 40 MG/0.4ML ~~LOC~~ SOLN
40.0000 mg | Freq: Every day | SUBCUTANEOUS | Status: DC
  Administered 2018-05-27 – 2018-05-29 (×3): 40 mg via SUBCUTANEOUS
  Filled 2018-05-27 (×3): qty 0.4

## 2018-05-27 MED ORDER — ONDANSETRON HCL 4 MG/2ML IJ SOLN: 4.0000 mg | Freq: Four times a day (QID) | INTRAMUSCULAR | Status: DC | PRN

## 2018-05-27 MED ORDER — METHYLPREDNISOLONE SODIUM SUCC 125 MG IJ SOLR
60.0000 mg | Freq: Four times a day (QID) | INTRAMUSCULAR | Status: DC
  Administered 2018-05-27 – 2018-05-29 (×6): 60 mg via INTRAVENOUS
  Filled 2018-05-27 (×6): qty 2

## 2018-05-27 MED ORDER — PREDNISONE 20 MG PO TABS: 40.0000 mg | ORAL_TABLET | Freq: Every day | ORAL | Status: DC

## 2018-05-27 MED ORDER — ACETAMINOPHEN 650 MG RE SUPP: 650.0000 mg | Freq: Four times a day (QID) | RECTAL | Status: DC | PRN

## 2018-05-27 MED ORDER — IPRATROPIUM-ALBUTEROL 0.5-2.5 (3) MG/3ML IN SOLN
3.0000 mL | Freq: Four times a day (QID) | RESPIRATORY_TRACT | Status: DC
  Administered 2018-05-28 – 2018-05-29 (×7): 3 mL via RESPIRATORY_TRACT
  Filled 2018-05-27 (×7): qty 3

## 2018-05-27 NOTE — Assessment & Plan Note (Signed)
Patient smokes 1 pack/day.  Offered her nicotine patch.  She refused.  Counseled her on tobacco cessation.  Discussed other forms of therapy to help her stop smoking.  She was resistant to stop smoking.  4 minutes of counseling provided.

## 2018-05-27 NOTE — Assessment & Plan Note (Signed)
Admit to inpatient medical bed.  Continue IV Solu-Medrol 60 mg every 6 hours.  Continue duo nebs every 6 hours.  Patient without a productive cough.  We will hold antibiotics for now.

## 2018-05-27 NOTE — ED Notes (Signed)
Report to [place], Therapist, sports at [employer].

## 2018-05-27 NOTE — H&P (Signed)
History and Physical    Amber Santiago LZJ:[id number] DOB: 10-16-1945 DOA: 05/27/2018  PCP: Simona Huh, NP  Patient coming from: home  I have personally briefly reviewed patient's old medical records in [place]  Chief Complaint: SOB, cough  HPI: Amber Santiago is a 73 y.o. 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  Patient has been unable to participate in physical therapy due to worsening cough and shortness of breath.  Patient states that she gets quite winded with even minimal activity.  She denies any fever or chills.  Cough is worse.  She is still smoking 1 pack/day despite her history of hypoxic respiratory failure, COPD and prior lung cancer.  She is evaluated in the ER.  She was noted to have worsening hypoxia.  She was also noted to have worsening wheezing and mild respiratory distress.  She was transferred to [facility] for further management.   ED Course: While at [facility], patient received continuous albuterol treatment, IV Solu-Medrol and supplemental oxygen.  Review of Systems:  Review of Systems  Constitutional: Positive for malaise/fatigue.  HENT: Negative.   Eyes: Negative.   Respiratory: Positive for cough, shortness of breath and wheezing. Negative for sputum production.   Cardiovascular: Negative.   Gastrointestinal: Negative.   Genitourinary: Negative.   Musculoskeletal: Negative.   Skin: Negative.        Chronic bilateral lower extremity lymphedema.  Stable.  Neurological: Positive for weakness.  Endo/Heme/Allergies: Negative.   Psychiatric/Behavioral: Negative.   All other systems reviewed and are negative.    Past Medical History:  Diagnosis  Date  . AAA (abdominal aortic aneurysm) ([place])   . 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 Arthritis   . Asthma   . Cancer ([place])    right lung  . Constipation   . COPD (chronic obstructive pulmonary disease) ([place])   . Hypertension   . Hyperthyroidism    PMH  . Lung nodule   . Non-traumatic compression fracture of T6 thoracic vertebra 11/28/2015  . Pneumonia   . Radiation 05/05/15-06/17/15   right central chest area 64 gray  . Umbilical hernia     Past Surgical History:  Procedure Laterality Date  . APPENDECTOMY    . DILATION AND CURETTAGE OF UTERUS    . IR RADIOLOGIST EVAL & MGMT  11/08/2016  . IR VERTEBROPLASTY CERV/THOR BX INC UNI/BIL INC/INJECT/IMAGING  11/12/2016  . IR VERTEBROPLASTY EA ADDL (T&LS) BX INC UNI/BIL INC INJECT/IMAGING  11/12/2016  . MEDIASTINOSCOPY N/A 04/12/2015   Procedure: MEDIASTINOSCOPY;  Surgeon: Grace Isaac, MD;  Location: [facility];  Service: Thoracic;  Laterality: N/A;  . VIDEO BRONCHOSCOPY Bilateral 03/18/2015   Procedure: VIDEO BRONCHOSCOPY WITH FLUORO;  Surgeon: Collene Gobble, MD;  Location: [facility];  Service: Cardiopulmonary;  Laterality: Bilateral;  . VIDEO BRONCHOSCOPY WITH ENDOBRONCHIAL ULTRASOUND N/A 04/12/2015   Procedure: VIDEO BRONCHOSCOPY WITH ENDOBRONCHIAL ULTRASOUND;  Surgeon: Grace Isaac, MD;  Location: [facility];  Service: Thoracic;  Laterality: N/A;     reports that she has been smoking cigarettes. She has a 50.00 pack-year smoking history. She has never used smokeless tobacco. She reports that she does not drink alcohol or use drugs.  Allergies  Allergen  Reactions  . Amoxicillin Anaphylaxis    Did it involve swelling of the face/tongue/throat, SOB, or low BP? Yes Did it involve sudden or severe rash/hives, skin peeling, or any reaction on the inside of your mouth or nose? Unknown Did you need to seek medical attention at a hospital or doctor's office? Yes When did it last happen?Not in last 10 years If all above  answers are "NO", may proceed with cephalosporin use.  Marland Kitchen Penicillins Anaphylaxis    Has patient had a PCN reaction causing immediate rash, facial/tongue/throat swelling, SOB or lightheadedness with hypotension: yes Has patient had a PCN reaction causing severe rash involving mucus membranes or skin necrosis: unknown Has patient had a PCN reaction that required hospitalization: office visist Has patient had a PCN reaction occurring within the last 10 years: no If all of the above answers are "NO", then may proceed with Cephalosporin use.   . Prednisone Other (See Comments)    Made pt feel very spacey and out of it    Family History  Problem Relation Age of Onset  . Breast cancer Maternal Grandmother     Prior to Admission medications   Medication Sig Start Date End Date Taking? Authorizing Provider  albuterol (PROAIR HFA) 108 (90 Base) MCG/ACT inhaler Inhale 2 puffs into the lungs every 6 (six) hours as needed for wheezing or shortness of breath. 04/16/16  Yes Collene Gobble, MD  Besifloxacin HCl (BESIVANCE) 0.6 % SUSP Place 1 drop into the right eye See admin instructions. Take 1 drop in the right eye three times daily on the day of Age Macular Degeneration procedure and 1 drop four times daily the day after - AMD procedure occurs once a month 11/16/16  Yes [provider]  budesonide-formoterol (SYMBICORT) 160-4.5 MCG/ACT inhaler INHALE TWO PUFFS INTO LUNGS TWICE DAILY Patient taking differently: Inhale 2 puffs into the lungs 2 (two) times daily.  02/08/15  Yes Collene Gobble, MD  busPIRone (BUSPAR) 10 MG tablet Take 10 mg by mouth 2 (two) times daily. 05/06/18  Yes [provider]  dorzolamide (TRUSOPT) 2 % ophthalmic solution INSTILL 2 DROPS INTO BOTH EYES TWICE A DAY Patient taking differently: Place 1 drop into both eyes 2 (two) times daily.  10/16/16  Yes Funches, Josalyn, MD  furosemide (LASIX) 40 MG tablet Take 40 mg by mouth daily.  05/20/18  Yes [provider]  ipratropium-albuterol (DUONEB) 0.5-2.5 (3) MG/3ML SOLN Take 3 mLs by nebulization every 6 (six) hours as needed (shortness of breath).  02/26/18  Yes [provider]  lisinopril (PRINIVIL,ZESTRIL) 2.5 MG tablet Take 2.5 mg by mouth daily.  05/20/18  Yes [provider]  Loteprednol Etabonate (LOTEMAX OP) Place 1 drop into the right eye 4 (four) times daily.    Yes [provider]  Multiple Vitamins-Minerals (PRESERVISION AREDS 2 PO) Take 2 capsules by mouth daily. Reported on 07/05/2015   Yes [provider]  Netarsudil-Latanoprost (ROCKLATAN OP) Place 1 drop into both eyes at bedtime.    Yes [provider]  Potassium Chloride ER 20 MEQ TBCR Take 1 tablet by mouth 3 (three) times a week.  05/06/18  Yes [provider]  Spacer/Aero-Holding Chambers (AEROCHAMBER MV) inhaler Use as instructed Patient not taking: Reported on 06/18/2016 07/17/13   Collene Gobble, MD    Physical Exam: Vitals:   05/27/18 1914 05/27/18 1947 05/27/18 2109 05/27/18 2258  BP:  (!) 153/68  (!) 152/73  Pulse:  (!) 116 (!) 121 Marland Kitchen)  109  Resp:  (!) 31 (!) 26 18  Temp:  97.9 F (36.6 C)  98.1 F (36.7 C)  TempSrc:    Oral  SpO2: 97% 96% 93% 93%  Weight:      Height:        Constitutional: NAD, calm, comfortable Vitals:   05/27/18 1914 05/27/18 1947 05/27/18 2109 05/27/18 2258  BP:  (!) 153/68  (!) 152/73  Pulse:  (!) 116 (!) 121 (!) 109  Resp:  (!) 31 (!) 26 18  Temp:  97.9 F (36.6 C)  98.1 F (36.7 C)  TempSrc:    Oral  SpO2: 97% 96% 93% 93%  Weight:      Height:       Physical Exam Vitals signs and nursing note reviewed.  Constitutional:      Appearance: She is ill-appearing.     Comments: Thin and pulmonary cachexia  HENT:     Head: Normocephalic.  Neck:     Vascular: No JVD.  Cardiovascular:     Rate and Rhythm: Regular rhythm. Tachycardia present.  Pulmonary:     Effort: Tachypnea and respiratory distress present.     Breath sounds:  Examination of the right-upper field reveals wheezing and rhonchi. Examination of the left-upper field reveals wheezing and rhonchi. Examination of the right-middle field reveals wheezing and rhonchi. Examination of the left-middle field reveals wheezing and rhonchi. Examination of the right-lower field reveals wheezing and rhonchi. Examination of the left-lower field reveals wheezing and rhonchi. Wheezing and rhonchi present.  Chest:     Chest wall: No mass.  Abdominal:     General: Bowel sounds are normal.     Palpations: Abdomen is soft. There is no hepatomegaly or mass.  Musculoskeletal:     Right lower leg: Edema present.     Left lower leg: Edema present.     Comments: See pictures of her chronic lymphedema  Skin:    General: Skin is warm and dry.     Capillary Refill: Capillary refill takes less than 2 seconds.  Neurological:     General: No focal deficit present.     Mental Status: She is alert and oriented to person, place, and time.  Psychiatric:        Mood and Affect: Mood is anxious.       Labs on Admission: I have personally reviewed following labs and imaging studies  CBC: Recent Labs  Lab 05/27/18 1757 05/27/18 1802  WBC 8.5  --   NEUTROABS 7.5  --   HGB 12.5 13.6  HCT 39.3 40.0  MCV 88.3  --   PLT 271  --    Basic Metabolic Panel: Recent Labs  Lab 05/27/18 1757 05/27/18 1802  NA 127* 126*  K 3.7 3.8  CL 87*  --   CO2 30  --   GLUCOSE 98  --   BUN 7*  --   CREATININE 0.36*  --   CALCIUM 8.4*  --    GFR: Estimated Creatinine Clearance: 51.7 mL/min (A) (by C-G formula based on SCr of 0.36 mg/dL (L)). Liver Function Tests: Recent Labs  Lab 05/27/18 1757  AST 42*  ALT 19  ALKPHOS 120  BILITOT 0.8  PROT 7.5  ALBUMIN 3.8   No results for input(s): LIPASE, AMYLASE in the last 168 hours. No results for input(s): AMMONIA in the last 168 hours. Coagulation Profile: No results for input(s): INR, PROTIME in the last 168 hours. Cardiac  Enzymes: Recent Labs  Lab 05/27/18  [place] <0.03   BNP (last 3 results) No results for input(s): PROBNP in the last 8760 hours. HbA1C: No results for input(s): HGBA1C in the last 72 hours. CBG: No results for input(s): GLUCAP in the last 168 hours. Lipid Profile: No results for input(s): CHOL, HDL, LDLCALC, TRIG, CHOLHDL, LDLDIRECT in the last 72 hours. Thyroid Function Tests: No results for input(s): TSH, T4TOTAL, FREET4, T3FREE, THYROIDAB in the last 72 hours. Anemia Panel: No results for input(s): VITAMINB12, FOLATE, FERRITIN, TIBC, IRON, RETICCTPCT in the last 72 hours. Urine analysis:    Component Value Date/Time   LABSPEC 1.005 05/17/2015 1301   PHURINE 6.5 05/17/2015 1301   GLUCOSEU Negative 05/17/2015 1301   HGBUR Negative 05/17/2015 1301   BILIRUBINUR Negative 05/17/2015 1301   KETONESUR Negative 05/17/2015 1301   PROTEINUR Negative 05/17/2015 1301   UROBILINOGEN 0.2 05/17/2015 1301   NITRITE Negative 05/17/2015 1301   LEUKOCYTESUR Negative 05/17/2015 1301    Radiological Exams on Admission: Dg Chest Port 1 View  Result Date: 05/27/2018 CLINICAL DATA:  Smoker. Pneumonia. Lung cancer. COPD. Worsening shortness of breath since last week. EXAM: PORTABLE CHEST 1 VIEW COMPARISON:  01/17/2018 CT. FINDINGS: Hyperinflation. Mild tracheal deviation to the right. No pleural effusion or pneumothorax. Normal heart size. Right upper lobe/apical consolidation with volume loss and hilar retraction. Grossly similar to the 01/17/2018 scout. Diffuse interstitial thickening. No new pulmonary opacity. Upper thoracic vertebral augmentation x2. IMPRESSION: No significant change compared to the scout film of the 01/17/2018 CT. Similar right upper lobe/apical consolidation with volume loss. Hyperinflation with interstitial thickening, likely related to COPD/chronic bronchitis. Electronically Signed   By: Abigail Miyamoto M.D.   On: 05/27/2018 18:46    EKG: Independently reviewed. Sinus  tachycardia  Assessment/Plan Principal Problem:   COPD exacerbation (HCC) Active Problems:   Acute on chronic respiratory failure with hypoxia ([facility])   Hyponatremia   Tobacco use disorder   Essential hypertension   Anxiety   Squamous cell lung cancer ([place])   Lymphedema of both lower extremities  COPD exacerbation ([place]) Admit to inpatient medical bed.  Continue IV Solu-Medrol 60 mg every 6 hours.  Continue duo nebs every 6 hours.  Patient without a productive cough.  We will hold antibiotics for now.  Hyponatremia Patient has been taking Lasix.  Hold Lasix for now.  Check TSH in the morning.  Acute on chronic respiratory failure with hypoxia (HCC) Patient normally uses 2 L of oxygen only at nighttime.  Patient is requiring 2 L of oxygen continuously.  Tobacco use disorder Patient smokes 1 pack/day.  Offered her nicotine patch.  She refused.  Counseled her on tobacco cessation.  Discussed other forms of therapy to help her stop smoking.  She was resistant to stop smoking.  4 minutes of counseling provided.  Essential hypertension Stable.  Will hold her Lasix for now given her hyponatremia.  Continue her lisinopril.  Anxiety Chronic.  Lymphedema of both lower extremities Chronic.  Patient states that her legs are about normal for her.  See picture.     Squamous cell lung cancer [facility]) Patient states that she is in clinical remission.    DVT prophylaxis: lovenox Code Status: Full Family Communication: discussed with patient at bedside. No family at bedside. Pt names tamara ley(dtr) has her health care agent. Disposition Plan: inpatient admission. DC when respiratory status has stabilized Consults called: none  Admission status: inpatient   Kristopher Oppenheim DO [facility]  05/27/2018, 11:17 PM

## 2018-05-27 NOTE — Assessment & Plan Note (Signed)
Patient states that she is in clinical remission.

## 2018-05-27 NOTE — Assessment & Plan Note (Addendum)
Stable.  Will hold her Lasix for now given her hyponatremia.  Continue her lisinopril.

## 2018-05-27 NOTE — Assessment & Plan Note (Signed)
Patient has been taking Lasix.  Hold Lasix for now.  Check TSH in the morning.

## 2018-05-27 NOTE — ED Triage Notes (Signed)
C/o increase in SOB since 02/14-O2 dropped during PT today-pt on O2 at night not during the day-pt to triage in w/c with family x 3

## 2018-05-27 NOTE — Assessment & Plan Note (Signed)
Chronic.  Patient states that her legs are about normal for her.  See picture.

## 2018-05-27 NOTE — ED Notes (Signed)
[facility] notified (Taryn) - patient ready for transport 

## 2018-05-27 NOTE — Assessment & Plan Note (Signed)
Patient normally uses 2 L of oxygen only at nighttime.  Patient is requiring 2 L of oxygen continuously.

## 2018-05-27 NOTE — ED Provider Notes (Addendum)
[facility] EMERGENCY DEPARTMENT Provider Note   CSN: [id number] Arrival date & time: 05/27/18  1716    History   Chief Complaint Chief Complaint  Patient presents with  . Shortness of Breath    HPI Amber Santiago is a 73 y.o. female hx of AAA, COPD, HTN, here presenting with shortness of breath.  Patient has been short of breath for the last week or so.  Patient states that she is short of breath with minimal exertion.  She states that she has been having nonproductive cough and wheezing but no fevers.  Patient does use oxygen at night but not during the day.  Patient went to physical therapy today and was noted to drop her oxygen to 80% when she ambulated so was sent here for evaluation.  Patient does have chronic lymphedema and chronic venous stasis and has been on multiple antibiotics and is currently on physical therapy for that.  She states that her lymphedema is stable and she denies any history of blood clots.     The history is provided by the patient.    Past Medical History:  Diagnosis Date  . AAA (abdominal aortic aneurysm) ([place])   . 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 Arthritis   . Asthma   . Cancer ([place])    right lung  . Constipation   . COPD (chronic obstructive pulmonary disease) ([place])   . Hypertension   . Hyperthyroidism    PMH  . Lung nodule   . Non-traumatic compression fracture of T6 thoracic vertebra 11/28/2015  . Pneumonia   . Radiation 05/05/15-06/17/15   right central chest area 64 gray  . Umbilical hernia     Patient Active Problem List   Diagnosis Date Noted  . Pedal edema 06/19/2016  . Essential hypertension 06/18/2016  . Osteoarthritis 06/18/2016  . Anxiety 06/18/2016  . Non-traumatic compression fracture of T6 thoracic vertebra 11/28/2015  . Fatigue 07/05/2015  . Squamous cell lung cancer ([place]) 03/18/2015  . COPD (chronic obstructive pulmonary disease) ([place]) 03/26/2013  . Pulmonary nodules 03/26/2013  .  Tobacco use disorder 03/26/2013    Past Surgical History:  Procedure Laterality Date  . APPENDECTOMY    . DILATION AND CURETTAGE OF UTERUS    . IR RADIOLOGIST EVAL & MGMT  11/08/2016  . IR VERTEBROPLASTY CERV/THOR BX INC UNI/BIL INC/INJECT/IMAGING  11/12/2016  . IR VERTEBROPLASTY EA ADDL (T&LS) BX INC UNI/BIL INC INJECT/IMAGING  11/12/2016  . MEDIASTINOSCOPY N/A 04/12/2015   Procedure: MEDIASTINOSCOPY;  Surgeon: Grace Isaac, MD;  Location: [facility];  Service: Thoracic;  Laterality: N/A;  . VIDEO BRONCHOSCOPY Bilateral 03/18/2015   Procedure: VIDEO BRONCHOSCOPY WITH FLUORO;  Surgeon: Collene Gobble, MD;  Location: [facility];  Service: Cardiopulmonary;  Laterality: Bilateral;  . VIDEO BRONCHOSCOPY WITH ENDOBRONCHIAL ULTRASOUND N/A 04/12/2015   Procedure: VIDEO BRONCHOSCOPY WITH ENDOBRONCHIAL ULTRASOUND;  Surgeon: Grace Isaac, MD;  Location: [facility];  Service: Thoracic;  Laterality: N/A;     OB History   No obstetric history on file.      Home Medications    Prior to Admission medications   Medication Sig Start Date End Date Taking? Authorizing Provider  Besifloxacin HCl (BESIVANCE) 0.6 % SUSP Place 1 drop into both eyes 4 times daily. 11/16/16  Yes [provider]  Loteprednol Etabonate (LOTEMAX OP) Apply to eye.   Yes [provider]  Netarsudil-Latanoprost (ROCKLATAN OP) Apply to eye.   Yes [provider]  albuterol (PROAIR HFA) 108 (90  Base) MCG/ACT inhaler Inhale 2 puffs into the lungs every 6 (six) hours as needed for wheezing or shortness of breath. 04/16/16   Collene Gobble, MD  ALPRAZolam Duanne Moron) 0.5 MG tablet Take 0.5 mg by mouth 3 (three) times daily as needed for anxiety. 10/30/16   [provider]  budesonide-formoterol (SYMBICORT) 160-4.5 MCG/ACT inhaler INHALE TWO PUFFS INTO LUNGS TWICE DAILY 02/08/15   Collene Gobble, MD  busPIRone (BUSPAR) 10 MG tablet Take 10 mg by mouth 2 (two) times daily. 05/06/18   [provider]    dorzolamide (TRUSOPT) 2 % ophthalmic solution INSTILL 2 DROPS INTO BOTH EYES TWICE A DAY 10/16/16   Funches, Josalyn, MD  furosemide (LASIX) 40 MG tablet TK 1 T PO D IF WEIGHT IS 3-5 POUNDS HIGHER THAN DRY WEIGHT OF 123-125 LBS 05/20/18   [provider]  ipratropium-albuterol (DUONEB) 0.5-2.5 (3) MG/3ML SOLN  02/26/18   [provider]  lisinopril (PRINIVIL,ZESTRIL) 2.5 MG tablet TK 1 TO 2 TS PO D 05/20/18   [provider]  Multiple Vitamins-Minerals (PRESERVISION AREDS 2 PO) Take 1 capsule by mouth daily. Reported on 07/05/2015    [provider]  Potassium Chloride ER 20 MEQ TBCR Take 1 tablet by mouth daily. 05/06/18   [provider]  Pyridoxine HCl (B-6 PO) Take 1 tablet by mouth daily. Reported on 08/04/2015    [provider]  Spacer/Aero-Holding Chambers (AEROCHAMBER MV) inhaler Use as instructed Patient not taking: Reported on 06/18/2016 07/17/13   Collene Gobble, MD    Family History Family History  Problem Relation Age of Onset  . Breast cancer Maternal Grandmother     Social History Social History   Tobacco Use  . Smoking status: Current Every Day Smoker    Packs/day: 1.00    Years: 50.00    Pack years: 50.00    Types: Cigarettes  . Smokeless tobacco: Never Used  Substance Use Topics  . Alcohol use: No    Alcohol/week: 0.0 standard drinks  . Drug use: No     Allergies   Amoxicillin and Penicillins   Review of Systems Review of Systems  Respiratory: Positive for shortness of breath.   All other systems reviewed and are negative.    Physical Exam Updated Vital Signs BP (!) 181/91 (BP Location: Left Arm)   Pulse 92   Temp 97.6 F (36.4 C) (Oral)   Resp (!) 32   Ht 5\' 1"  (1.549 m)   Wt 59 kg   SpO2 97%   BMI 24.56 kg/m   Physical Exam Vitals signs and nursing note reviewed.  HENT:     Head: Normocephalic.     Mouth/Throat:     Mouth: Mucous membranes are moist.     Pharynx: Oropharynx is clear.   Eyes:     Extraocular Movements: Extraocular movements intact.     Pupils: Pupils are equal, round, and reactive to light.  Neck:     Musculoskeletal: Normal range of motion and neck supple.  Cardiovascular:     Rate and Rhythm: Normal rate and regular rhythm.  Pulmonary:     Comments: Tachypneic, diffuse wheezing, no retractions  Abdominal:     General: Bowel sounds are normal.     Palpations: Abdomen is soft.  Musculoskeletal: Normal range of motion.     Comments: Chronic bilateral lymphedema   Skin:    General: Skin is warm.     Capillary Refill: Capillary refill takes less than 2 seconds.  Neurological:  General: No focal deficit present.     Mental Status: She is alert and oriented to person, place, and time.  Psychiatric:        Mood and Affect: Mood normal.        Behavior: Behavior normal.      ED Treatments / Results  Labs (all labs ordered are listed, but only abnormal results are displayed) Labs Reviewed  CBC WITH DIFFERENTIAL/PLATELET - Abnormal; Notable for the following components:      Result Value   Lymphs Abs 0.4 (*)    All other components within normal limits  COMPREHENSIVE METABOLIC PANEL - Abnormal; Notable for the following components:   Sodium 127 (*)    Chloride 87 (*)    BUN 7 (*)    Creatinine, Ser 0.36 (*)    Calcium 8.4 (*)    AST 42 (*)    All other components within normal limits  POCT I-STAT EG7 - Abnormal; Notable for the following components:   Bicarbonate 31.0 (*)    TCO2 33 (*)    Acid-Base Excess 4.0 (*)    Sodium 126 (*)    Calcium, Ion 1.08 (*)    All other components within normal limits  TROPONIN I  BLOOD GAS, VENOUS    EKG EKG Interpretation  Date/Time:  Tuesday May 27 2018 18:23:58 EST Ventricular Rate:  114 PR Interval:    QRS Duration: 133 QT Interval:  322 QTC Calculation: 444 R Axis:   72 Text Interpretation:  Sinus tachycardia Ventricular trigeminy Probable left ventricular hypertrophy ST depr,  consider ischemia, inferior leads Minimal ST elevation, lateral leads PVC new since previous  Confirmed by Wandra Arthurs [phone]) on 05/27/2018 6:27:26 PM   Radiology Dg Chest Port 1 View  Result Date: 05/27/2018 CLINICAL DATA:  Smoker. Pneumonia. Lung cancer. COPD. Worsening shortness of breath since last week. EXAM: PORTABLE CHEST 1 VIEW COMPARISON:  01/17/2018 CT. FINDINGS: Hyperinflation. Mild tracheal deviation to the right. No pleural effusion or pneumothorax. Normal heart size. Right upper lobe/apical consolidation with volume loss and hilar retraction. Grossly similar to the 01/17/2018 scout. Diffuse interstitial thickening. No new pulmonary opacity. Upper thoracic vertebral augmentation x2. IMPRESSION: No significant change compared to the scout film of the 01/17/2018 CT. Similar right upper lobe/apical consolidation with volume loss. Hyperinflation with interstitial thickening, likely related to COPD/chronic bronchitis. Electronically Signed   By: Abigail Miyamoto M.D.   On: 05/27/2018 18:46    Procedures Procedures (including critical care time)  CRITICAL CARE Performed by: Wandra Arthurs   Total critical care time: 30 minutes  Critical care time was exclusive of separately billable procedures and treating other patients.  Critical care was necessary to treat or prevent imminent or life-threatening deterioration.  Critical care was time spent personally by me on the following activities: development of treatment plan with patient and/or surrogate as well as nursing, discussions with consultants, evaluation of patient's response to treatment, examination of patient, obtaining history from patient or surrogate, ordering and performing treatments and interventions, ordering and review of laboratory studies, ordering and review of radiographic studies, pulse oximetry and re-evaluation of patient's condition.   Medications Ordered in ED Medications  magnesium sulfate IVPB 2 g 50 mL (2 g  Intravenous New Bag/Given 05/27/18 1844)  0.9 %  sodium chloride infusion ( Intravenous New Bag/Given 05/27/18 1835)  albuterol (PROVENTIL,VENTOLIN) solution continuous neb (15 mg/hr Nebulization Given 05/27/18 1800)  ipratropium (ATROVENT) nebulizer solution 1 mg (1 mg Nebulization Given 05/27/18 1750)  methylPREDNISolone  sodium succinate (SOLU-MEDROL) 125 mg/2 mL injection 125 mg (125 mg Intravenous Given 05/27/18 1803)     Initial Impression / Assessment and Plan / ED Course  I have reviewed the triage vital signs and the nursing notes.  Pertinent labs & imaging results that were available during my care of the patient were reviewed by me and considered in my medical decision making (see chart for details).       Dawnetta Copenhaver is a 73 y.o. female here with SOB. Likely COPD exacerbation. Patient was hypoxic when she ambulated. She has oxygen at home but only uses at night. Will get labs, VBG, CXR. Will give continuous nebs, steroids, magnesium. Will reassess after meds.   7:35 PM CXR showed COPD, known mass vs radiation changes. Sodium is 127. Given IVF. Patient still tachypneic and O2 is still 91% on 2 L [place] and she is still tachycardic to 110. VBG reassuring. Will admit for COPD exacerbation.    Final Clinical Impressions(s) / ED Diagnoses   Final diagnoses:  None    ED Discharge Orders    None       Drenda Freeze, MD 05/27/18 1943    Drenda Freeze, MD 05/27/18 [phone]

## 2018-05-27 NOTE — Assessment & Plan Note (Signed)
Chronic. 

## 2018-05-28 DIAGNOSIS — E871 Hypo-osmolality and hyponatremia: Secondary | ICD-10-CM

## 2018-05-28 DIAGNOSIS — J9621 Acute and chronic respiratory failure with hypoxia: Secondary | ICD-10-CM

## 2018-05-28 DIAGNOSIS — I890 Lymphedema, not elsewhere classified: Secondary | ICD-10-CM

## 2018-05-28 DIAGNOSIS — I10 Essential (primary) hypertension: Secondary | ICD-10-CM

## 2018-05-28 DIAGNOSIS — F419 Anxiety disorder, unspecified: Secondary | ICD-10-CM

## 2018-05-28 DIAGNOSIS — F17200 Nicotine dependence, unspecified, uncomplicated: Secondary | ICD-10-CM

## 2018-05-28 DIAGNOSIS — C3491 Malignant neoplasm of unspecified part of right bronchus or lung: Secondary | ICD-10-CM

## 2018-05-28 DIAGNOSIS — J441 Chronic obstructive pulmonary disease with (acute) exacerbation: Principal | ICD-10-CM

## 2018-05-28 LAB — BASIC METABOLIC PANEL
Anion gap: 9 (ref 5–15)
BUN: 9 mg/dL (ref 8–23)
CO2: 29 mmol/L (ref 22–32)
Calcium: 8.3 mg/dL — ABNORMAL LOW (ref 8.9–10.3)
Chloride: 90 mmol/L — ABNORMAL LOW (ref 98–111)
Creatinine, Ser: 0.48 mg/dL (ref 0.44–1.00)
GFR calc non Af Amer: >60 mL/min (ref >60)
Glucose, Bld: 153 mg/dL — ABNORMAL HIGH (ref 70–99)
Potassium: 4.1 mmol/L (ref 3.5–5.1)
Sodium: 128 mmol/L — ABNORMAL LOW (ref 135–145)

## 2018-05-28 LAB — CBC WITH DIFFERENTIAL/PLATELET
Abs Immature Granulocytes: 0.07 10*3/uL (ref 0.00–0.07)
BASOS ABS: 0 10*3/uL (ref 0.0–0.1)
Basophils Relative: 0 %
Eosinophils Absolute: 0 10*3/uL (ref 0.0–0.5)
Eosinophils Relative: 0 %
HCT: 35.7 % — ABNORMAL LOW (ref 36.0–46.0)
Hemoglobin: 11.4 g/dL — ABNORMAL LOW (ref 12.0–15.0)
Immature Granulocytes: 1 %
LYMPHS ABS: 0.2 10*3/uL — AB (ref 0.7–4.0)
Lymphocytes Relative: 1 %
MCH: 28.3 pg (ref 26.0–34.0)
MCHC: 31.9 g/dL (ref 30.0–36.0)
MCV: 88.6 fL (ref 80.0–100.0)
Monocytes Absolute: 0.2 10*3/uL (ref 0.1–1.0)
Monocytes Relative: 1 %
NRBC: 0 % (ref 0.0–0.2)
Neutro Abs: 12.4 10*3/uL — ABNORMAL HIGH (ref 1.7–7.7)
Neutrophils Relative %: 97 %
Platelets: 290 10*3/uL (ref 150–400)
RBC: 4.03 MIL/uL (ref 3.87–5.11)
RDW: 13.9 % (ref 11.5–15.5)
WBC: 12.8 10*3/uL — ABNORMAL HIGH (ref 4.0–10.5)

## 2018-05-28 LAB — RESPIRATORY PANEL BY PCR

## 2018-05-28 LAB — COMPREHENSIVE METABOLIC PANEL
ALT: 19 U/L (ref 0–44)
AST: 41 U/L (ref 15–41)
Albumin: 3.4 g/dL — ABNORMAL LOW (ref 3.5–5.0)
Alkaline Phosphatase: 110 U/L (ref 38–126)
Anion gap: 11 (ref 5–15)
BUN: 7 mg/dL — ABNORMAL LOW (ref 8–23)
CO2: 26 mmol/L (ref 22–32)
Calcium: 8.4 mg/dL — ABNORMAL LOW (ref 8.9–10.3)
Chloride: 89 mmol/L — ABNORMAL LOW (ref 98–111)
Creatinine, Ser: 0.42 mg/dL — ABNORMAL LOW (ref 0.44–1.00)
GFR calc Af Amer: >60 mL/min (ref >60)
GFR calc non Af Amer: >60 mL/min (ref >60)
GLUCOSE: 165 mg/dL — AB (ref 70–99)
POTASSIUM: 3.8 mmol/L (ref 3.5–5.1)
Sodium: 126 mmol/L — ABNORMAL LOW (ref 135–145)
TOTAL PROTEIN: 7 g/dL (ref 6.5–8.1)
Total Bilirubin: 0.5 mg/dL (ref 0.3–1.2)

## 2018-05-28 LAB — CREATININE, SERUM
Creatinine, Ser: 0.41 mg/dL — ABNORMAL LOW (ref 0.44–1.00)
GFR calc Af Amer: >60 mL/min (ref >60)

## 2018-05-28 LAB — GLUCOSE, CAPILLARY
Glucose-Capillary: 139 mg/dL — ABNORMAL HIGH (ref 70–99)
Glucose-Capillary: 194 mg/dL — ABNORMAL HIGH (ref 70–99)
Glucose-Capillary: 178 mg/dL — ABNORMAL HIGH (ref 70–99)
Glucose-Capillary: 143 mg/dL — ABNORMAL HIGH (ref 70–99)

## 2018-05-28 LAB — TSH: TSH: 0.557 u[IU]/mL (ref 0.350–4.500)

## 2018-05-28 LAB — OSMOLALITY: Osmolality: 267 mOsm/kg — ABNORMAL LOW (ref 275–295)

## 2018-05-28 LAB — T4, FREE: Free T4: 1.4 ng/dL (ref 0.82–1.77)

## 2018-05-28 MED ORDER — BUSPIRONE HCL 5 MG PO TABS: 10.0000 mg | ORAL_TABLET | Freq: Two times a day (BID) | ORAL | Status: DC

## 2018-05-28 MED ORDER — SODIUM CHLORIDE 0.9 % IV SOLN
INTRAVENOUS | Status: DC
  Administered 2018-05-28 – 2018-05-29 (×3): via INTRAVENOUS

## 2018-05-28 MED ORDER — LOTEPREDNOL ETABONATE 0.5 % OP SUSP
1.0000 [drp] | Freq: Four times a day (QID) | OPHTHALMIC | Status: DC
  Administered 2018-05-28 – 2018-05-30 (×9): 1 [drp] via OPHTHALMIC
  Filled 2018-05-28: qty 5

## 2018-05-28 MED ORDER — LISINOPRIL 5 MG PO TABS
2.5000 mg | ORAL_TABLET | Freq: Every day | ORAL | Status: DC
  Administered 2018-05-28 – 2018-05-29 (×2): 2.5 mg via ORAL
  Filled 2018-05-28 (×2): qty 1

## 2018-05-28 MED ORDER — BUDESONIDE 0.5 MG/2ML IN SUSP
0.5000 mg | Freq: Two times a day (BID) | RESPIRATORY_TRACT | Status: DC
  Administered 2018-05-28 – 2018-05-29 (×4): 0.5 mg via RESPIRATORY_TRACT
  Filled 2018-05-28 (×5): qty 2

## 2018-05-28 MED ORDER — NETARSUDIL-LATANOPROST 0.02-0.005 % OP SOLN
1.0000 [drp] | Freq: Every day | OPHTHALMIC | Status: DC
  Administered 2018-05-28 – 2018-05-29 (×2): 1 [drp] via OPHTHALMIC

## 2018-05-28 MED ORDER — BUSPIRONE HCL 5 MG PO TABS
5.0000 mg | ORAL_TABLET | Freq: Two times a day (BID) | ORAL | Status: DC
  Administered 2018-05-28 – 2018-05-30 (×5): 5 mg via ORAL
  Filled 2018-05-28 (×5): qty 1

## 2018-05-28 MED ORDER — FLUTICASONE PROPIONATE 50 MCG/ACT NA SUSP
2.0000 | Freq: Every day | NASAL | Status: DC
  Administered 2018-05-28 – 2018-05-30 (×3): 2 via NASAL
  Filled 2018-05-28: qty 16

## 2018-05-28 MED ORDER — DORZOLAMIDE HCL 2 % OP SOLN
1.0000 [drp] | Freq: Two times a day (BID) | OPHTHALMIC | Status: DC
  Administered 2018-05-28 – 2018-05-30 (×4): 1 [drp] via OPHTHALMIC
  Filled 2018-05-28: qty 10

## 2018-05-28 MED ORDER — LORAZEPAM 0.5 MG PO TABS
0.5000 mg | ORAL_TABLET | Freq: Once | ORAL | Status: AC
  Administered 2018-05-28: 0.5 mg via ORAL
  Filled 2018-05-28: qty 1

## 2018-05-28 MED ORDER — PANTOPRAZOLE SODIUM 40 MG PO TBEC
40.0000 mg | DELAYED_RELEASE_TABLET | Freq: Every day | ORAL | Status: DC
  Administered 2018-05-28 – 2018-05-30 (×3): 40 mg via ORAL
  Filled 2018-05-28 (×3): qty 1

## 2018-05-28 MED ORDER — GATIFLOXACIN 0.5 % OP SOLN
1.0000 [drp] | Freq: Three times a day (TID) | OPHTHALMIC | Status: DC
  Administered 2018-05-28 – 2018-05-29 (×2): 1 [drp] via OPHTHALMIC
  Filled 2018-05-28: qty 2.5

## 2018-05-28 MED ORDER — SODIUM CHLORIDE 0.9 % IV SOLN
100.0000 mg | Freq: Two times a day (BID) | INTRAVENOUS | Status: DC
  Administered 2018-05-28 (×2): 100 mg via INTRAVENOUS
  Filled 2018-05-28 (×3): qty 100

## 2018-05-28 MED ORDER — LORATADINE 10 MG PO TABS
10.0000 mg | ORAL_TABLET | Freq: Every day | ORAL | Status: DC
  Administered 2018-05-28 – 2018-05-30 (×3): 10 mg via ORAL
  Filled 2018-05-28 (×3): qty 1

## 2018-05-28 MED ORDER — IPRATROPIUM-ALBUTEROL 0.5-2.5 (3) MG/3ML IN SOLN: 3.0000 mL | RESPIRATORY_TRACT | Status: DC | PRN

## 2018-05-28 NOTE — Evaluation (Signed)
Occupational Therapy Evaluation Patient Details Name: Amber Santiago MRN: [id number] DOB: 28-Oct-1945 Today's Date: 05/28/2018    History of Present Illness 73 y.o. female with medical history significant of with a history of COPD, chronic nocturnal hypoxic respite failure on 2 L of oxygen, history of squamous cell lung cancer right upper lobe status post XRT, HTN, chronic lower extremity lymphedema presents to the ED with 4 to 5 days of worsening shortness of breath and cough.    Clinical Impression   PTA, pt was living with her husband and daughter and required assistance for LB ADLs and IADLs. Pt currently requiring Min-Max A for LB ADLs and Min A for functional mobility. During functional mobility, pt presenting with LOB and required Min A for correction and fall prevention. Pt benefited from [place] for UE support during mobility. SpO2 93-91% on 2L. Pt's daughter verbalizing concern with assistance at home and pt requiring increased support. Pt would benefit from further acute OT to facilitate safe dc. Recommend dc to home with HHOT for further OT to optimize safety, independence with ADLs, and return to PLOF.      Follow Up Recommendations  Home health OT;Supervision/Assistance - 24 hour(HHaide)    Equipment Recommendations  None recommended by OT    Recommendations for Other Services PT consult     Precautions / Restrictions Precautions Precautions: Fall;Other (comment)(O2)      Mobility Bed Mobility               General bed mobility comments: Sitting at EOB with famiyl at bedside  Transfers Overall transfer level: Needs assistance Equipment used: None Transfers: Sit to/from Stand Sit to Stand: Min guard         General transfer comment: Min guard A for safety    Balance Overall balance assessment: Needs assistance Sitting-balance support: No upper extremity supported;Feet supported Sitting balance-Leahy Scale: Fair     Standing balance support: No upper  extremity supported;During functional activity;Bilateral upper extremity supported Standing balance-Leahy Scale: Poor Standing balance comment: Without DME, pt with posterior lean and LOB during mobility. Pt requiring Min A to correct LOB. benefits from UE support on RW                           ADL either performed or assessed with clinical judgement   ADL Overall ADL's : Needs assistance/impaired Eating/Feeding: Independent;Sitting   Grooming: Wash/dry hands;Min guard;Standing   Upper Body Bathing: Min guard;Sitting   Lower Body Bathing: Minimal assistance;Sit to/from stand   Upper Body Dressing : Min guard;Sitting   Lower Body Dressing: Maximal assistance;Sit to/from stand Lower Body Dressing Details (indicate cue type and reason): Max A to don underwear and socks Toilet Transfer: Min guard;Ambulation;Regular Toilet;Grab bars Toilet Transfer Details (indicate cue type and reason): Min Guard A for safety Toileting- Clothing Manipulation and Hygiene: Supervision/safety;Sitting/lateral lean       Functional mobility during ADLs: Min guard;Minimal assistance;Rolling walker General ADL Comments: Pt demonstating decreased activity tolerance and fatigues quickly. SpO2 ranging between 93-91% on 2L O2. During functional mobility, pt with LOB without DME and required Min A for fall prevention.      Vision         Perception     Praxis      Pertinent Vitals/Pain Pain Assessment: Faces Faces Pain Scale: Hurts little more Pain Location: Back Pain Descriptors / Indicators: Constant;Discomfort;Grimacing Pain Intervention(s): Monitored during session;Repositioned;Limited activity within patient's tolerance     Hand Dominance Right  Extremity/Trunk Assessment Upper Extremity Assessment Upper Extremity Assessment: Generalized weakness   Lower Extremity Assessment Lower Extremity Assessment: Defer to PT evaluation   Cervical / Trunk Assessment Cervical / Trunk  Assessment: Other exceptions Cervical / Trunk Exceptions: Prior back sx   Communication Communication Communication: No difficulties   Cognition Arousal/Alertness: Awake/alert Behavior During Therapy: WFL for tasks assessed/performed Overall Cognitive Status: Within Functional Limits for tasks assessed                                     General Comments  Husband and two daughters present throughout session    Exercises     Shoulder Instructions      Home Living Family/patient expects to be discharged to:: Private residence Living Arrangements: Spouse/significant other;Children Available Help at Discharge: Family;Available 24 hours/day Type of Home: House Home Access: Level entry     Home Layout: Two level;Bed/bath upstairs Alternate Level Stairs-Number of Steps: 12 Alternate Level Stairs-Rails: Left Bathroom Shower/Tub: Occupational psychologist: Handicapped height     Home Equipment: Bedside commode          Prior Functioning/Environment Level of Independence: Needs assistance  Gait / Transfers Assistance Needed: Declines to use DME. Daughter provides single hand held A ADL's / Homemaking Assistance Needed: Daughter assists with LB ADLs            OT Problem List: Decreased activity tolerance;Impaired balance (sitting and/or standing);Decreased knowledge of use of DME or AE;Decreased knowledge of precautions;Pain;Cardiopulmonary status limiting activity      OT Treatment/Interventions: Self-care/ADL training;Therapeutic exercise;Energy conservation;DME and/or AE instruction;Therapeutic activities;Patient/family education    OT Goals(Current goals can be found in the care plan section) Acute Rehab OT Goals Patient Stated Goal: Return to home with help OT Goal Formulation: With patient/family Time For Goal Achievement: 06/11/18 Potential to Achieve Goals: Good  OT Frequency: Min 2X/week   Barriers to D/C:             Co-evaluation              AM-PAC OT "6 Clicks" Daily Activity     Outcome Measure Help from another person eating meals?: None Help from another person taking care of personal grooming?: A Little Help from another person toileting, which includes using toliet, bedpan, or urinal?: A Little Help from another person bathing (including washing, rinsing, drying)?: A Little Help from another person to put on and taking off regular upper body clothing?: A Little Help from another person to put on and taking off regular lower body clothing?: A Lot 6 Click Score: 18   End of Session Equipment Utilized During Treatment: Rolling walker;Oxygen(2L) Nurse Communication: Mobility status  Activity Tolerance: Patient limited by fatigue Patient left: in chair;with call bell/phone within reach;with chair alarm set  OT Visit Diagnosis: Unsteadiness on feet (R26.81);Other abnormalities of gait and mobility (R26.89);Muscle weakness (generalized) (M62.81);Pain Pain - part of body: (Back)                Time: 1610-9604 OT Time Calculation (min): 33 min Charges:  OT General Charges $OT Visit: 1 Visit OT Evaluation $OT Eval Moderate Complexity: 1 Mod OT Treatments $Self Care/Home Management : 8-22 mins  Mellina Benison MSOT, OTR/L Acute Rehab Pager: [phone] Office: [place] 05/28/2018, 4:44 PM

## 2018-05-28 NOTE — Progress Notes (Signed)
Patient takes Netarsudil-Latanoprost ([place] OP) eye drops at bedtime. Pharmacy does not have this available. Patient is to use her own home supply. It has been set at the bedside.

## 2018-05-28 NOTE — Progress Notes (Addendum)
PROGRESS NOTE    Amber Santiago  UJW:[id number] DOB: [birth year] DOA: 05/27/2018 PCP: Simona Huh, NP    Brief Narrative:  Patient is 73 year old female history of COPD on chronic nocturnal O2, history of squamous cell lung cancer right upper lobe status post XRT, history of hypertension, chronic lower extremity lymphedema presented to the ED with a 4 to 5-day history of worsening shortness of breath and cough.  Patient noted to have shortness of breath on minimal exertion.  Patient on admission noted to have worsening wheezing and mild respiratory distress and patient admitted for acute COPD exacerbation.   Assessment & Plan:   Principal Problem:   COPD exacerbation ([place]) Active Problems:   Tobacco use disorder   Squamous cell lung cancer ([place])   Essential hypertension   Anxiety   Acute on chronic respiratory failure with hypoxia (HCC)   Lymphedema of both lower extremities   Hyponatremia  1 acute on chronic respiratory failure with hypoxia secondary to acute COPD exacerbation Patient presented with worsening shortness of breath, productive cough, hypoxia with increasing O2 requirements from her baseline.  Chest x-ray negative for any acute infiltrate.  Respiratory viral panel pending.  Continue IV Solu-Medrol.  Placed on Pulmicort, Claritin, Flonase, PPI, Mucinex.  Continue scheduled nebs.  Follow.  Tobacco cessation expressed to patient.  2.  Hypertension Resume home regimen of lisinopril.  3.  Hyponatremia Likely secondary to hypovolemic hyponatremia in the setting of diuretics versus SIADH in the setting of prior history of lung cancer.  Check a urine and serum osmolality.  Check urine sodium.  Check a urine creatinine.  Check a UA with cultures and sensitivities.  TSH within normal limits at 0.557.  Free T4 at 1.40.  Patient's diuretics on hold.  Place on IV fluids.  Serial BMETs.  Follow.  4.  Anxiety Resume BuSpar at 5 mg twice daily as per family patient was on a lower  dose.  5.  History of squamous cell lung cancer Outpatient follow-up.  6.  Lymphedema of bilateral lower extremities Outpatient follow-up.  7.  Tobacco abuse Patient smokes a pack per day.  Nicotine patch was offered however patient refused.  Tobacco cessation stressed to patient.  Patient seems to be resistant to stop smoking.  Outpatient follow-up.  8.  History of AAA Outpatient follow-up with vascular surgery.  9.  Macular degeneration bilateral Resume home eyedrops.   DVT prophylaxis: Lovenox Code Status: Full Family Communication: Updated patient and daughters at bedside. Disposition Plan: Home when clinically improved.   Consultants:   None  Procedures:  Chest x-ray 05/27/2018  Antimicrobials:   Doxycycline 05/28/2018   Subjective: Patient sitting up in bed getting a breathing treatment.  Patient states some improvement with shortness of breath since admission however not at baseline.  Patient states only uses nasal cannula oxygen at bedtime.  Denies any chest pain.  No nausea vomiting.  Objective: Vitals:   05/28/18 0617 05/28/18 0625 05/28/18 0654 05/28/18 1315  BP: (!) 161/99  (!) 151/78   Pulse: (!) 102  99   Resp: (!) 28  (!) 24   Temp: 98.6 F (37 C)  98.2 F (36.8 C)   TempSrc: Oral  Oral   SpO2: 96% 96% 94% 94%  Weight:      Height:        Intake/Output Summary (Last 24 hours) at 05/28/2018 1329 Last data filed at 05/28/2018 0532 Gross per 24 hour  Intake 144.5 ml  Output 300 ml  Net -155.5 ml  Filed Weights   05/27/18 1724 05/28/18 0529  Weight: 59 kg 60.8 kg    Examination:  General exam: Getting a breathing treatment. Respiratory system: Minimal expiratory wheezing.  Fair air movement.  No crackles.  No rhonchi.  Cardiovascular system: S1 & S2 heard, RRR. No JVD, murmurs, rubs, gallops or clicks. No pedal edema. Gastrointestinal system: Abdomen is nondistended, soft and nontender. No organomegaly or masses felt. Normal bowel sounds  heard. Central nervous system: Alert and oriented. No focal neurological deficits. Extremities: Chronic venous stasis changes noted.  Bilateral lower extremity with lymphedema.  Skin: Chronic venous stasis changes noted bilateral lower extremities. Psychiatry: Judgement and insight appear normal. Mood & affect appropriate.     Data Reviewed: I have personally reviewed following labs and imaging studies  CBC: Recent Labs  Lab 05/27/18 1757 05/27/18 1802 05/28/18 0319  WBC 8.5  --  12.8*  NEUTROABS 7.5  --  12.4*  HGB 12.5 13.6 11.4*  HCT 39.3 40.0 35.7*  MCV 88.3  --  88.6  PLT 271  --  161   Basic Metabolic Panel: Recent Labs  Lab 05/27/18 1757 05/27/18 1802 05/28/18 0319  NA 127* 126* 126*  K 3.7 3.8 3.8  CL 87*  --  89*  CO2 30  --  26  GLUCOSE 98  --  165*  BUN 7*  --  7*  CREATININE 0.36*  --  0.42*  0.41*  CALCIUM 8.4*  --  8.4*   GFR: Estimated Creatinine Clearance: 52.4 mL/min (A) (by C-G formula based on SCr of 0.41 mg/dL (L)). Liver Function Tests: Recent Labs  Lab 05/27/18 1757 05/28/18 0319  AST 42* 41  ALT 19 19  ALKPHOS 120 110  BILITOT 0.8 0.5  PROT 7.5 7.0  ALBUMIN 3.8 3.4*   No results for input(s): LIPASE, AMYLASE in the last 168 hours. No results for input(s): AMMONIA in the last 168 hours. Coagulation Profile: No results for input(s): INR, PROTIME in the last 168 hours. Cardiac Enzymes: Recent Labs  Lab 05/27/18 1757  TROPONINI <0.03   BNP (last 3 results) No results for input(s): PROBNP in the last 8760 hours. HbA1C: No results for input(s): HGBA1C in the last 72 hours. CBG: Recent Labs  Lab 05/28/18 0738 05/28/18 1230  GLUCAP 139* 194*   Lipid Profile: No results for input(s): CHOL, HDL, LDLCALC, TRIG, CHOLHDL, LDLDIRECT in the last 72 hours. Thyroid Function Tests: Recent Labs    05/28/18 0319  TSH 0.557  FREET4 1.40   Anemia Panel: No results for input(s): VITAMINB12, FOLATE, FERRITIN, TIBC, IRON, RETICCTPCT in  the last 72 hours. Sepsis Labs: No results for input(s): PROCALCITON, LATICACIDVEN in the last 168 hours.  No results found for this or any previous visit (from the past 240 hour(s)).       Radiology Studies: Dg Chest Port 1 View  Result Date: 05/27/2018 CLINICAL DATA:  Smoker. Pneumonia. Lung cancer. COPD. Worsening shortness of breath since last week. EXAM: PORTABLE CHEST 1 VIEW COMPARISON:  01/17/2018 CT. FINDINGS: Hyperinflation. Mild tracheal deviation to the right. No pleural effusion or pneumothorax. Normal heart size. Right upper lobe/apical consolidation with volume loss and hilar retraction. Grossly similar to the 01/17/2018 scout. Diffuse interstitial thickening. No new pulmonary opacity. Upper thoracic vertebral augmentation x2. IMPRESSION: No significant change compared to the scout film of the 01/17/2018 CT. Similar right upper lobe/apical consolidation with volume loss. Hyperinflation with interstitial thickening, likely related to COPD/chronic bronchitis. Electronically Signed   By: Adria Devon.D.  On: 05/27/2018 18:46        Scheduled Meds: . budesonide (PULMICORT) nebulizer solution  0.5 mg Nebulization BID  . busPIRone  5 mg Oral BID  . dorzolamide  1 drop Both Eyes BID  . enoxaparin (LOVENOX) injection  40 mg Subcutaneous QHS  . fluticasone  2 spray Each Nare Daily  . gatifloxacin  1 drop Right Eye TID  . ipratropium-albuterol  3 mL Nebulization Q6H  . loratadine  10 mg Oral Daily  . loteprednol  1 drop Right Eye QID  . methylPREDNISolone (SOLU-MEDROL) injection  60 mg Intravenous Q6H  . pantoprazole  40 mg Oral Daily   Continuous Infusions: . sodium chloride 100 mL/hr at 05/28/18 1023  . doxycycline (VIBRAMYCIN) IV 100 mg (05/28/18 1040)     LOS: 1 day    Time spent: 53 mins    Irine Seal, MD [facility]  If 7PM-7AM, please contact night-coverage [email or web address] Password [facility] 05/28/2018, 1:29 PM

## 2018-05-29 ENCOUNTER — Inpatient Hospital Stay (HOSPITAL_COMMUNITY): Payer: Medicare HMO

## 2018-05-29 DIAGNOSIS — C3490 Malignant neoplasm of unspecified part of unspecified bronchus or lung: Secondary | ICD-10-CM

## 2018-05-29 DIAGNOSIS — Z515 Encounter for palliative care: Secondary | ICD-10-CM

## 2018-05-29 LAB — BASIC METABOLIC PANEL
Anion gap: 10 (ref 5–15)
BUN: 8 mg/dL (ref 8–23)
CALCIUM: 8.4 mg/dL — AB (ref 8.9–10.3)
CO2: 27 mmol/L (ref 22–32)
CREATININE: 0.39 mg/dL — AB (ref 0.44–1.00)
Chloride: 93 mmol/L — ABNORMAL LOW (ref 98–111)
GFR calc Af Amer: >60 mL/min (ref >60)
GFR calc non Af Amer: >60 mL/min (ref >60)
Glucose, Bld: 144 mg/dL — ABNORMAL HIGH (ref 70–99)
Potassium: 4 mmol/L (ref 3.5–5.1)
Sodium: 130 mmol/L — ABNORMAL LOW (ref 135–145)

## 2018-05-29 LAB — URINALYSIS, ROUTINE W REFLEX MICROSCOPIC
BILIRUBIN URINE: NEGATIVE
Glucose, UA: NEGATIVE mg/dL
HGB URINE DIPSTICK: NEGATIVE
Ketones, ur: NEGATIVE mg/dL
Leukocytes,Ua: NEGATIVE
Nitrite: NEGATIVE
Protein, ur: NEGATIVE mg/dL
Specific Gravity, Urine: 1.005 (ref 1.005–1.030)
pH: 8 (ref 5.0–8.0)

## 2018-05-29 LAB — CBC WITH DIFFERENTIAL/PLATELET
Abs Immature Granulocytes: 0.11 10*3/uL — ABNORMAL HIGH (ref 0.00–0.07)
Basophils Absolute: 0 10*3/uL (ref 0.0–0.1)
Basophils Relative: 0 %
EOS ABS: 0 10*3/uL (ref 0.0–0.5)
EOS PCT: 0 %
HCT: 36.2 % (ref 36.0–46.0)
Hemoglobin: 11.5 g/dL — ABNORMAL LOW (ref 12.0–15.0)
Immature Granulocytes: 1 %
Lymphocytes Relative: 2 %
Lymphs Abs: 0.3 10*3/uL — ABNORMAL LOW (ref 0.7–4.0)
MCH: 28.8 pg (ref 26.0–34.0)
MCHC: 31.8 g/dL (ref 30.0–36.0)
MCV: 90.7 fL (ref 80.0–100.0)
Monocytes Absolute: 0.4 10*3/uL (ref 0.1–1.0)
Monocytes Relative: 4 %
Neutro Abs: 11.6 10*3/uL — ABNORMAL HIGH (ref 1.7–7.7)
Neutrophils Relative %: 93 %
Platelets: 258 10*3/uL (ref 150–400)
RBC: 3.99 MIL/uL (ref 3.87–5.11)
RDW: 14.1 % (ref 11.5–15.5)
WBC: 12.4 10*3/uL — ABNORMAL HIGH (ref 4.0–10.5)
nRBC: 0 % (ref 0.0–0.2)

## 2018-05-29 LAB — GLUCOSE, CAPILLARY
Glucose-Capillary: 134 mg/dL — ABNORMAL HIGH (ref 70–99)
Glucose-Capillary: 126 mg/dL — ABNORMAL HIGH (ref 70–99)
Glucose-Capillary: 142 mg/dL — ABNORMAL HIGH (ref 70–99)
Glucose-Capillary: 144 mg/dL — ABNORMAL HIGH (ref 70–99)

## 2018-05-29 LAB — SODIUM, URINE, RANDOM: SODIUM UR: 77 mmol/L

## 2018-05-29 LAB — OSMOLALITY, URINE: Osmolality, Ur: 258 mOsm/kg — ABNORMAL LOW (ref 300–900)

## 2018-05-29 LAB — CREATININE, URINE, RANDOM: Creatinine, Urine: 14.39 mg/dL

## 2018-05-29 MED ORDER — LEVALBUTEROL HCL 0.63 MG/3ML IN NEBU: 0.6300 mg | INHALATION_SOLUTION | RESPIRATORY_TRACT | Status: DC | PRN

## 2018-05-29 MED ORDER — DOXYCYCLINE HYCLATE 100 MG PO TABS
100.0000 mg | ORAL_TABLET | Freq: Two times a day (BID) | ORAL | Status: DC
  Administered 2018-05-29 – 2018-05-30 (×3): 100 mg via ORAL
  Filled 2018-05-29 (×3): qty 1

## 2018-05-29 MED ORDER — IPRATROPIUM BROMIDE 0.02 % IN SOLN
0.5000 mg | Freq: Four times a day (QID) | RESPIRATORY_TRACT | Status: DC
  Administered 2018-05-29 – 2018-05-30 (×3): 0.5 mg via RESPIRATORY_TRACT
  Filled 2018-05-29 (×3): qty 2.5

## 2018-05-29 MED ORDER — METHYLPREDNISOLONE SODIUM SUCC 125 MG IJ SOLR
60.0000 mg | Freq: Two times a day (BID) | INTRAMUSCULAR | Status: DC
  Administered 2018-05-29 – 2018-05-30 (×2): 60 mg via INTRAVENOUS
  Filled 2018-05-29 (×2): qty 2

## 2018-05-29 MED ORDER — LEVALBUTEROL HCL 0.63 MG/3ML IN NEBU
0.6300 mg | INHALATION_SOLUTION | Freq: Four times a day (QID) | RESPIRATORY_TRACT | Status: DC
  Administered 2018-05-29 – 2018-05-30 (×3): 0.63 mg via RESPIRATORY_TRACT
  Filled 2018-05-29 (×3): qty 3

## 2018-05-29 MED ORDER — MIRTAZAPINE 15 MG PO TBDP
7.5000 mg | ORAL_TABLET | Freq: Every day | ORAL | Status: DC
  Administered 2018-05-29: 7.5 mg via ORAL
  Filled 2018-05-29 (×2): qty 0.5

## 2018-05-29 MED ORDER — HYDROXYZINE HCL 10 MG PO TABS
10.0000 mg | ORAL_TABLET | Freq: Three times a day (TID) | ORAL | Status: DC | PRN
  Filled 2018-05-29: qty 1

## 2018-05-29 MED ORDER — LISINOPRIL 5 MG PO TABS
5.0000 mg | ORAL_TABLET | Freq: Every day | ORAL | Status: DC
  Administered 2018-05-30: 5 mg via ORAL
  Filled 2018-05-29: qty 1

## 2018-05-29 MED ORDER — IPRATROPIUM BROMIDE 0.02 % IN SOLN: 0.5000 mg | RESPIRATORY_TRACT | Status: DC | PRN

## 2018-05-29 NOTE — Care Management Note (Signed)
Case Management Note  Patient Details  Name: Amber Santiago MRN: [id number] Date of Birth: 04/06/1946  This CM spoke with pt, husband and both daughters at bedside at length about dc planning. One daughter lives with and has been caring for pt and father at their home. Daughter indicates that she needs additional help and is tearful during conversation. Daughter states that they are active with [facility] for home health services and would like to continue at dc. [facility] rep alerted of needed for continued services at discharge. This CM also spoke with family about palliative care services at discharge. Family very interested in palliative services. This CM called referral in to [facility] referral line for [facility].     Expected Discharge Date:                  Expected Discharge Plan:     In-House Referral:     Discharge planning Services     Post Acute Care Choice:    Choice offered to:     DME Arranged:    DME Agency:     HH Arranged:   RN,PT,OT,SW,AIDE HH Agency:   [facility], [facility] ([facility])  Status of Service:     If discussed at [place] of Stay Meetings, dates discussed:    Additional CommentsLynnell Catalan, RN 05/29/2018, 2:10 PM  [phone]

## 2018-05-29 NOTE — Consult Note (Signed)
Consultation Note Date: 05/30/2018   Patient Name: Amber Santiago  DOB: 21-Mar-1946  MRN: [id number]  Age / Sex: 73 y.o., female  PCP: Simona Huh, NP Referring Physician: Eugenie Filler, MD  Reason for Consultation: Establishing goals of care and Psychosocial/spiritual support  HPI/Patient Profile: 73 y.o. female  with past medical history of  Non small cell lung cancer s/p XRT, compression fracture, COPD, AAA, lower extremity chronic lymphedema, who was admitted on 05/27/2018 with hyponatremia and shortness of breath secondary to a COPD exacerbation.     Clinical Assessment and Goals of Care:  I have reviewed medical records including EPIC notes, labs and imaging, received report from Dr. Grandville Silos, assessed the patient and then met at the bedside along with her daughter [name]  to discuss diagnosis prognosis, GOC, EOL wishes, disposition and options.  I introduced Palliative Medicine as specialized medical care for people living with serious illness. It focuses on providing relief from the symptoms and stress of a serious illness. The goal is to improve quality of life for both the patient and the family.  We discussed a brief life review of the patient. She is originally from [place].  She has 2 daughters, 1 son and 1 step son.  Her husband has had a stroke and is rather sedentary.  She lives in her home with her husband and her daughter [name] is their full time care taker.  [name] originally came over from [place] to care for her father after his stroke.  The plan was for her to stay 6 months, but she has been here for 2 years.  [name] is beginning to suffer care giver burnout.  Mrs. Critz is very independent and likes to make her own decisions.  She is also hard of hearing and somewhat dependent on [name] for communication assistance.  She tells me that her goals are to remain alive long enough to see [name]  and Nicki Reaper (her son) settle down with spouses and possibly even have children one day.  She is very happy that her other daughter is married with 3 kids.    Ms. Broecker admits that her functional status has declined significantly over the past year.  Her ability to walk and her appetite have declined.  Per [name] her mother's greatest joy is to play a solitare game on her IPAD.   Mrs. Hilley and I discussed the fact that COPD is a progressive illness her and unfortunately her decline will continue.  I attempted to ascertain if she values more quality of life or quantity of life.  Mrs. Mcgrory does NOT like coming to the hospital and she will continue to smoke - however she tells me she prefers to remain positive in her thoughts.    At [place] urging we discussed HCPOA and attempted to discuss advanced directives.  Mrs.  [place] named [name] as her HCPOA (not her husband) and Nicki Reaper (her son) as [name]'s back up should a back up be needed.   However she was too fatigued to complete the paper  work today.  I left Advanced Directive paper work with Loss adjuster, chartered for both her mother and family.    We discussed Mrs. Worden's symptoms of insomnia and dyspnea.  She reports that she is supposed to sleep with oxygen.  However the oxygen just stops working 2 or 3 hours after she goes to bed - and she wakes unable to breathe.  We discussed talking with her oxygen supplier and a trial of low dose morphine for dyspnea.   Hospice and Palliative Care services outpatient were explained and offered.  The patient will go home with outpatient palliative care follow up.  Questions and concerns were addressed.  Hard Choices booklet left for review with [name]. The family was encouraged to call with questions or concerns.    Primary Decision Maker:  PATIENT    SUMMARY OF RECOMMENDATIONS    Patient assigned [name] (dtr) as HCPOA and Nicki Reaper (son) as her back up.  She would prefer to go thru notarizing the paperwork at a different  time.  Patient will be followed at home by Palliative Care - [facility]  Recommend Rx for oxygen be changed to 24/7.  My understanding is that she currently is prescribed oxygen for night time only.  [place] agency supplying oxygen address the issue of why it cuts off in the middle of the night (unacceptable).  Code Status/Advance Care Planning:  Full - discussed but the patient was hesitant to provide an answer.   Symptom Management:   Low dose morphine for dyspnea.  Rx written for 30 ML   Prognosis:  Difficult to determine, but I would not be surprised if she passed away in the next year.    Discharge Planning: Home with Home Health and Palliative      Primary Diagnoses: Present on Admission: . COPD exacerbation ([place]) . Tobacco use disorder . Squamous cell lung cancer ([place]) . Essential hypertension . Anxiety   I have reviewed the medical record, interviewed the patient and family, and examined the patient. The following aspects are pertinent.  Past Medical History:  Diagnosis Date  . AAA (abdominal aortic aneurysm) ([place])   . 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 Arthritis   . Asthma   . Cancer ([place])    right lung  . Constipation   . COPD (chronic obstructive pulmonary disease) ([place])   . Hypertension   . Hyperthyroidism    PMH  . Lung nodule   . Non-traumatic compression fracture of T6 thoracic vertebra 11/28/2015  . Pneumonia   . Radiation 05/05/15-06/17/15   right central chest area 64 gray  . Umbilical hernia    Social History   Socioeconomic History  . Marital status: Married    Spouse name: Not on file  . Number of children: 4  . Years of education: Not on file  . Highest education level: Not on file  Occupational History  . Occupation: retired  Scientific laboratory technician  . Financial resource strain: Not on file  . Food insecurity:    Worry: Not on file    Inability: Not on file  . Transportation needs:    Medical:  Not on file    Non-medical: Not on file  Tobacco Use  . Smoking status: Current Every Day Smoker    Packs/day: 1.00    Years: 50.00    Pack years: 50.00    Types: Cigarettes  . Smokeless tobacco: Never Used  Substance and Sexual Activity  . Alcohol use: No    Alcohol/week:  0.0 standard drinks  . Drug use: No  . Sexual activity: Not on file  Lifestyle  . Physical activity:    Days per week: Not on file    Minutes per session: Not on file  . Stress: Not on file  Relationships  . Social connections:    Talks on phone: Not on file    Gets together: Not on file    Attends religious service: Not on file    Active member of club or organization: Not on file    Attends meetings of clubs or organizations: Not on file    Relationship status: Not on file  Other Topics Concern  . Not on file  Social History Narrative  . Not on file   Family History  Problem Relation Age of Onset  . Breast cancer Maternal Grandmother    Scheduled Meds: . budesonide (PULMICORT) nebulizer solution  0.5 mg Nebulization BID  . busPIRone  5 mg Oral BID  . dorzolamide  1 drop Both Eyes BID  . doxycycline  100 mg Oral Q12H  . enoxaparin (LOVENOX) injection  40 mg Subcutaneous QHS  . fluticasone  2 spray Each Nare Daily  . gatifloxacin  1 drop Right Eye TID  . ipratropium-albuterol  3 mL Nebulization Q6H  . lisinopril  2.5 mg Oral Daily  . loratadine  10 mg Oral Daily  . loteprednol  1 drop Right Eye QID  . methylPREDNISolone (SOLU-MEDROL) injection  60 mg Intravenous Q12H  . mirtazapine  7.5 mg Oral QHS  . Netarsudil-Latanoprost  1 drop Both Eyes QHS  . pantoprazole  40 mg Oral Daily   Continuous Infusions: . sodium chloride 100 mL/hr at 05/29/18 1225   PRN Meds:.acetaminophen **OR** acetaminophen, guaiFENesin-dextromethorphan, ipratropium-albuterol, ondansetron **OR** ondansetron (ZOFRAN) IV Allergies  Allergen Reactions  . Amoxicillin Anaphylaxis    Did it involve swelling of the  face/tongue/throat, SOB, or low BP? Yes Did it involve sudden or severe rash/hives, skin peeling, or any reaction on the inside of your mouth or nose? Unknown Did you need to seek medical attention at a hospital or doctor's office? Yes When did it last happen?Not in last 10 years If all above answers are "NO", may proceed with cephalosporin use.  Marland Kitchen Penicillins Anaphylaxis    Has patient had a PCN reaction causing immediate rash, facial/tongue/throat swelling, SOB or lightheadedness with hypotension: yes Has patient had a PCN reaction causing severe rash involving mucus membranes or skin necrosis: unknown Has patient had a PCN reaction that required hospitalization: office visist Has patient had a PCN reaction occurring within the last 10 years: no If all of the above answers are "NO", then may proceed with Cephalosporin use.   . Prednisone Other (See Comments)    Made pt feel very spacey and out of it   Review of Systems complains of insomnia, dyspnea, decreased endurance  Physical Exam  Thin, frail, elderly appearing female.  Looks fatigued.  Sitting in recliner chair.  Alert, orientated, closeds  Vital Signs: BP (!) 143/84 (BP Location: Right Arm)   Pulse 88   Temp 98 F (36.7 C) (Oral)   Resp 18   Ht '5\' 1"'$  (1.549 m)   Wt 59.2 kg   SpO2 92%   BMI 24.66 kg/m  Pain Scale: 0-10   Pain Score: 0-No pain   SpO2: SpO2: 92 % O2 Device:SpO2: 92 % O2 Flow Rate: .O2 Flow Rate (L/min): 2 L/min  IO: Intake/output summary:   Intake/Output Summary (Last 24 hours) at  05/29/2018 1524 Last data filed at 05/29/2018 1512 Gross per 24 hour  Intake 2506.72 ml  Output 1750 ml  Net 756.72 ml    LBM: Last BM Date: 05/27/18 Baseline Weight: Weight: 59 kg Most recent weight: Weight: 59.2 kg     Palliative Assessment/Data: 50%     Time In: 11:00 Time Out: 12:10 Time Total: 70 min Greater than 50%  of this time was spent counseling and coordinating care related to the above  assessment and plan.  Signed by: Florentina Jenny, PA-C Palliative Medicine Pager: [phone]  Please contact Palliative Medicine Team phone at [phone] for questions and concerns.  For individual provider: See Shea Evans

## 2018-05-29 NOTE — Progress Notes (Addendum)
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  Patient noted to have shortness of breath on minimal exertion.  Patient on admission noted to have worsening wheezing and mild respiratory distress and patient admitted for acute COPD exacerbation.   Assessment & Plan:   Principal Problem:   COPD exacerbation ([place]) Active Problems:   Tobacco use disorder   Squamous cell lung cancer ([place])   Essential hypertension   Anxiety   Acute on chronic respiratory failure with hypoxia (HCC)   Lymphedema of both lower extremities   Hyponatremia  1 acute on chronic respiratory failure with hypoxia secondary to acute COPD exacerbation Patient presented with worsening shortness of breath, productive cough, hypoxia with increasing O2 requirements from her baseline.  Chest x-ray negative for any acute infiltrate.  Respiratory viral panel negative.  Decrease IV Solu-Medrol to 60 mg every 12 hours.  Likely transition to oral prednisone if continued improvement tomorrow.  Continue Pulmicort, Claritin, Flonase, PPI, Mucinex.  Continue scheduled nebs.  Follow.  Tobacco cessation expressed to patient.  Patient however not ready to quit smoking.  2.  Hypertension Saline lock IV fluids.  Increase lisinopril to 5 mg daily.  Follow.   3.  Hyponatremia Likely secondary to hypovolemic hyponatremia in the setting of diuretics versus SIADH in the setting of prior history of lung cancer.  Urine sodium was 77, urine osmolality of 258, urine creatinine of 14.39.  TSH within normal limits at 0.557.  Free T4 at 1.40.  Patient's diuretics on hold.  Hyponatremia  improved with hydration.  Sodium currently at 130.  Saline lock IV fluids.  Repeat labs in the morning. Follow.  4.  Anxiety Continue current dose of BuSpar 5 mg twice daily.  Patient noted to have some confusion after being given Ativan overnight.  Will place on hydroxyzine 10 mg p.o. 3 times daily as needed anxiety.  5.  History of squamous cell lung cancer Outpatient follow-up.  6.  Lymphedema of bilateral lower extremities Outpatient follow-up.  7.  Tobacco abuse Patient smokes a pack per day.  Nicotine patch was offered however patient refused.  Tobacco cessation stressed to patient.  Patient seems to be resistant to stop smoking.  Outpatient follow-up.  8.  History of AAA Outpatient follow-up with vascular surgery.  9.  Macular degeneration bilateral Continue home eyedrops.  10.  Prognosis Patient with a likely poor prognosis as patient with COPD on chronic home O2 with a history of squamous cell lung cancer, lymphedema of bilateral lower extremities, ongoing tobacco use, history of AAA.  Patient also noted with poor oral intake.  Family concerned about patient's prognosis.  Consulted palliative care for goals of care.   DVT prophylaxis: Lovenox Code Status: Full Family Communication: Updated patient and daughters at bedside. Disposition Plan: Home when clinically improved hopefully in the next 24 to 48 hours.   Consultants:   None  Procedures:  Chest x-ray 05/27/2018  Antimicrobials:   Doxycycline 05/28/2018   Subjective: Patient laying in bed.  Patient noted to have some confusion earlier on felt secondary to Ativan that was given overnight.  Patient however alert to self place and time.  States she feels her shortness of breath is improving since admission.  No chest pain.  No nausea or vomiting.    Objective: Vitals:   05/28/18 2202 05/29/18 0250 05/29/18 0517 05/29/18 0832  BP: (!) 167/85  (!) 143/84   Pulse: 98  88   Resp: (!) 24  18   Temp: 98.2 F (36.8  C)  98 F (36.7 C)   TempSrc: Oral  Oral   SpO2: 98% 94% 96% 92%  Weight:   59.2 kg   Height:        Intake/Output Summary (Last 24 hours) at 05/29/2018 1213 Last data filed at 05/29/2018 0801 Gross per 24 hour  Intake 1834.66 ml  Output 850 ml  Net 984.66 ml   Filed Weights   05/27/18 1724 05/28/18 0529 05/29/18 0517  Weight: 59 kg 60.8 kg 59.2 kg    Examination:  General exam: NAD Respiratory system: Minimal expiratory wheezing.  Poor to fair air movement.  No crackles.  No rhonchi.  Cardiovascular system: Regular rate rhythm no murmurs rubs or gallops.  No JVD.  No lower extremity edema. Gastrointestinal system: Abdomen is soft, nontender, nondistended, positive bowel sounds.  No rebound.  No guarding.  Central nervous system: Alert and oriented. No focal neurological deficits. Extremities: Chronic venous stasis changes noted.  Bilateral lower extremity with lymphedema.  Skin: Chronic venous stasis changes noted bilateral lower extremities. Psychiatry: Judgement and insight appear normal. Mood & affect appropriate.     Data Reviewed: I have personally reviewed following labs and imaging studies  CBC: Recent Labs  Lab 05/27/18 1757 05/27/18 1802 05/28/18 0319 05/29/18 0408  WBC 8.5  --  12.8* 12.4*  NEUTROABS 7.5  --  12.4* 11.6*  HGB 12.5 13.6 11.4* 11.5*  HCT 39.3 40.0 35.7* 36.2  MCV 88.3  --  88.6 90.7  PLT 271  --  290 027   Basic Metabolic Panel: Recent Labs  Lab 05/27/18 1757 05/27/18 1802 05/28/18 0319 05/28/18 1409 05/29/18 0408  NA 127* 126* 126* 128* 130*  K 3.7 3.8 3.8 4.1 4.0  CL 87*  --  89* 90* 93*  CO2 30  --  26 29 27   GLUCOSE 98  --  165* 153* 144*  BUN 7*  --  7* 9 8  CREATININE 0.36*  --  0.42*  0.41* 0.48 0.39*  CALCIUM 8.4*  --  8.4* 8.3* 8.4*   GFR: Estimated Creatinine Clearance: 51.8 mL/min (A) (by C-G formula based on SCr of 0.39 mg/dL (L)). Liver Function Tests: Recent Labs  Lab 05/27/18 1757 05/28/18 0319  AST 42*  41  ALT 19 19  ALKPHOS 120 110  BILITOT 0.8 0.5  PROT 7.5 7.0  ALBUMIN 3.8 3.4*   No results for input(s): LIPASE, AMYLASE in the last 168 hours. No results for input(s): AMMONIA in the last 168 hours. Coagulation Profile: No results for input(s): INR, PROTIME in the last 168 hours. Cardiac Enzymes: Recent Labs  Lab 05/27/18 1757  TROPONINI <0.03   BNP (last 3 results) No results for input(s): PROBNP in the last 8760 hours. HbA1C: No results for input(s): HGBA1C in the last 72 hours. CBG: Recent Labs  Lab 05/28/18 0738 05/28/18 1230 05/28/18 1704 05/28/18 2205 05/29/18 0755  GLUCAP 139* 194* 178* 143* 134*   Lipid Profile: No results for input(s): CHOL, HDL, LDLCALC, TRIG, CHOLHDL, LDLDIRECT in the last 72 hours. Thyroid Function Tests: Recent Labs    05/28/18 0319  TSH 0.557  FREET4 1.40  Anemia Panel: No results for input(s): VITAMINB12, FOLATE, FERRITIN, TIBC, IRON, RETICCTPCT in the last 72 hours. Sepsis Labs: No results for input(s): PROCALCITON, LATICACIDVEN in the last 168 hours.  Recent Results (from the past 240 hour(s))  Respiratory Panel by PCR     Status: None   Collection Time: 05/28/18  5:42 PM  Result Value Ref Range Status   Adenovirus NOT DETECTED NOT DETECTED Final   Coronavirus 229E NOT DETECTED NOT DETECTED Final    Comment: (NOTE) The Coronavirus on the Respiratory Panel, DOES NOT test for the novel  Coronavirus (2019 nCoV)    Coronavirus HKU1 NOT DETECTED NOT DETECTED Final   Coronavirus NL63 NOT DETECTED NOT DETECTED Final   Coronavirus OC43 NOT DETECTED NOT DETECTED Final   Metapneumovirus NOT DETECTED NOT DETECTED Final   Rhinovirus / Enterovirus NOT DETECTED NOT DETECTED Final   Influenza A NOT DETECTED NOT DETECTED Final   Influenza B NOT DETECTED NOT DETECTED Final   Parainfluenza Virus 1 NOT DETECTED NOT DETECTED Final   Parainfluenza Virus 2 NOT DETECTED NOT DETECTED Final   Parainfluenza Virus 3 NOT DETECTED NOT DETECTED  Final   Parainfluenza Virus 4 NOT DETECTED NOT DETECTED Final   Respiratory Syncytial Virus NOT DETECTED NOT DETECTED Final   Bordetella pertussis NOT DETECTED NOT DETECTED Final   Chlamydophila pneumoniae NOT DETECTED NOT DETECTED Final   Mycoplasma pneumoniae NOT DETECTED NOT DETECTED Final    Comment: Performed at [facility], [place]. [address], [place], [place] [address]         Radiology Studies: Dg Chest Port 1 View  Result Date: 05/27/2018 CLINICAL DATA:  Smoker. Pneumonia. Lung cancer. COPD. Worsening shortness of breath since last week. EXAM: PORTABLE CHEST 1 VIEW COMPARISON:  01/17/2018 CT. FINDINGS: Hyperinflation. Mild tracheal deviation to the right. No pleural effusion or pneumothorax. Normal heart size. Right upper lobe/apical consolidation with volume loss and hilar retraction. Grossly similar to the 01/17/2018 scout. Diffuse interstitial thickening. No new pulmonary opacity. Upper thoracic vertebral augmentation x2. IMPRESSION: No significant change compared to the scout film of the 01/17/2018 CT. Similar right upper lobe/apical consolidation with volume loss. Hyperinflation with interstitial thickening, likely related to COPD/chronic bronchitis. Electronically Signed   By: Abigail Miyamoto M.D.   On: 05/27/2018 18:46        Scheduled Meds: . budesonide (PULMICORT) nebulizer solution  0.5 mg Nebulization BID  . busPIRone  5 mg Oral BID  . dorzolamide  1 drop Both Eyes BID  . doxycycline  100 mg Oral Q12H  . enoxaparin (LOVENOX) injection  40 mg Subcutaneous QHS  . fluticasone  2 spray Each Nare Daily  . gatifloxacin  1 drop Right Eye TID  . ipratropium-albuterol  3 mL Nebulization Q6H  . lisinopril  2.5 mg Oral Daily  . loratadine  10 mg Oral Daily  . loteprednol  1 drop Right Eye QID  . methylPREDNISolone (SOLU-MEDROL) injection  60 mg Intravenous Q12H  . Netarsudil-Latanoprost  1 drop Both Eyes QHS  . pantoprazole  40 mg Oral Daily   Continuous Infusions: .  sodium chloride 100 mL/hr at 05/29/18 1149     LOS: 2 days    Time spent: 40 mins    Irine Seal, MD [facility]  If 7PM-7AM, please contact night-coverage [email or web address] Password [facility] 05/29/2018, 12:13 PM

## 2018-05-29 NOTE — Evaluation (Signed)
Physical Therapy Evaluation Patient Details Name: Amber Santiago MRN: [id number] DOB: [birth year] Today's Date: 05/29/2018   History of Present Illness  73 y.o. female admitted with COPD exac. 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   Clinical Impression  On eval, pt was Min guard-Min assist for mobility. She walked a very short distance on today, ~5 feet in room. Distance limited due to pt anxiety, drowsiness. She did not rest at all last night per daughter. Remained on 2L [place] during session, sats fluctuated between 88-93%. HR fluctuated as well-as high as 123 bpm. Both daughter and pt were extremely anxious at start of session. Daughter was also frustrated about bed alarm requirement. Assisted pt back to bed at end of session-she quickly fell asleep. Will continue to follow and progress activity as tolerated.     Follow Up Recommendations Home health PT;Supervision/Assistance - 24 hour(if pt/family are agreeable)    Equipment Recommendations  None recommended by PT(would benefit from walker use but pt refuses)    Recommendations for Other Services       Precautions / Restrictions Precautions Precautions: Fall Precaution Comments: monitor O2 Restrictions Weight Bearing Restrictions: No      Mobility  Bed Mobility Overal bed mobility: Needs Assistance Bed Mobility: Supine to Sit;Sit to Supine     Supine to sit: Min guard Sit to supine: Min assist   General bed mobility comments: Assist for LEs onto bed.   Transfers Overall transfer level: Needs assistance Equipment used: Rolling walker (2 wheeled);None Transfers: Sit to/from Stand Sit to Stand: Min guard         General transfer comment: for safety.   Ambulation/Gait Ambulation/Gait assistance: Min guard Gait Distance (Feet): 5 Feet(x2) Assistive device:  None;Rolling walker (2 wheeled)       General Gait Details: Pt walked a short distance, bed<>bsc. Close guard for safety. Remained on [place] O2-sats fluctuated between 88-91% on 2L.  Stairs            Wheelchair Mobility    Modified Rankin (Stroke Patients Only)       Balance Overall balance assessment: Mild deficits observed, not formally tested           Standing balance-Leahy Scale: Fair                               Pertinent Vitals/Pain Pain Assessment: Faces Faces Pain Scale: Hurts little more Pain Location: Back Pain Descriptors / Indicators: Discomfort Pain Intervention(s): Limited activity within patient's tolerance;Repositioned    Home Living Family/patient expects to be discharged to:: Private residence Living Arrangements: Spouse/significant other;Children Available Help at Discharge: Family;Available 24 hours/day Type of Home: House Home Access: Level entry     Home Layout: Two level;Bed/bath upstairs Home Equipment: Bedside commode      Prior Function Level of Independence: Needs assistance   Gait / Transfers Assistance Needed: Declines to use DME. Daughter provides single hand held A  ADL's / Homemaking Assistance Needed: Daughter assists with ADLs        Hand Dominance   Dominant Hand: Right    Extremity/Trunk Assessment   Upper Extremity Assessment Upper Extremity Assessment: Defer to OT evaluation    Lower Extremity Assessment Lower Extremity Assessment: Generalized weakness    Cervical / Trunk Assessment Cervical / Trunk Assessment:  Kyphotic  Communication   Communication: No difficulties  Cognition Arousal/Alertness: Awake/alert Behavior During Therapy: Anxious Overall Cognitive Status: Within Functional Limits for tasks assessed                                 General Comments: mildly agitated      General Comments      Exercises     Assessment/Plan    PT Assessment Patient needs  continued PT services  PT Problem List Decreased mobility;Decreased activity tolerance;Decreased strength;Decreased balance;Pain;Cardiopulmonary status limiting activity       PT Treatment Interventions DME instruction;Gait training;Functional mobility training;Therapeutic activities;Balance training;Patient/family education;Therapeutic exercise    PT Goals (Current goals can be found in the Care Plan section)  Acute Rehab PT Goals Patient Stated Goal: Return to home with help PT Goal Formulation: With patient/family Time For Goal Achievement: 06/12/18 Potential to Achieve Goals: Fair    Frequency Min 3X/week   Barriers to discharge        Co-evaluation               AM-PAC PT "6 Clicks" Mobility  Outcome Measure Help needed turning from your back to your side while in a flat bed without using bedrails?: A Little Help needed moving from lying on your back to sitting on the side of a flat bed without using bedrails?: A Little Help needed moving to and from a bed to a chair (including a wheelchair)?: A Little Help needed standing up from a chair using your arms (e.g., wheelchair or bedside chair)?: A Little Help needed to walk in hospital room?: A Little Help needed climbing 3-5 steps with a railing? : A Lot 6 Click Score: 17    End of Session Equipment Utilized During Treatment: Oxygen Activity Tolerance: Patient limited by fatigue Patient left: in bed;with call bell/phone within reach Nurse Communication: (spoke with NT about bed alarm-family requested that I did not reset it since she would be in room with pt.) PT Visit Diagnosis: Difficulty in walking, not elsewhere classified (R26.2);Muscle weakness (generalized) (M62.81);Unsteadiness on feet (R26.81)    Time: 1740-8144 PT Time Calculation (min) (ACUTE ONLY): 34 min   Charges:   PT Evaluation $PT Eval Moderate Complexity: 1 Mod PT Treatments $Gait Training: 23-37 mins         Weston Anna, PT Acute  Rehabilitation Services Pager: [phone] Office: [phone]

## 2018-05-30 LAB — BASIC METABOLIC PANEL
Anion gap: 8 (ref 5–15)
BUN: 10 mg/dL (ref 8–23)
CO2: 34 mmol/L — AB (ref 22–32)
Calcium: 8.6 mg/dL — ABNORMAL LOW (ref 8.9–10.3)
Chloride: 92 mmol/L — ABNORMAL LOW (ref 98–111)
Creatinine, Ser: 0.47 mg/dL (ref 0.44–1.00)
GFR calc non Af Amer: >60 mL/min (ref >60)
Glucose, Bld: 128 mg/dL — ABNORMAL HIGH (ref 70–99)
Potassium: 4 mmol/L (ref 3.5–5.1)
Sodium: 134 mmol/L — ABNORMAL LOW (ref 135–145)

## 2018-05-30 LAB — GLUCOSE, CAPILLARY
Glucose-Capillary: 99 mg/dL (ref 70–99)
Glucose-Capillary: 132 mg/dL — ABNORMAL HIGH (ref 70–99)
Glucose-Capillary: 172 mg/dL — ABNORMAL HIGH (ref 70–99)

## 2018-05-30 LAB — URINE CULTURE: Culture: <10000 — AB

## 2018-05-30 LAB — CBC
HCT: 38.2 % (ref 36.0–46.0)
Hemoglobin: 12 g/dL (ref 12.0–15.0)
MCH: 28.5 pg (ref 26.0–34.0)
MCHC: 31.4 g/dL (ref 30.0–36.0)
MCV: 90.7 fL (ref 80.0–100.0)
NRBC: 0 % (ref 0.0–0.2)
PLATELETS: 276 10*3/uL (ref 150–400)
RBC: 4.21 MIL/uL (ref 3.87–5.11)
RDW: 14 % (ref 11.5–15.5)
WBC: 10.6 10*3/uL — ABNORMAL HIGH (ref 4.0–10.5)

## 2018-05-30 MED ORDER — DOXYCYCLINE HYCLATE 100 MG PO TABS: 100.0000 mg | ORAL_TABLET | Freq: Two times a day (BID) | ORAL | 0 refills | Status: AC

## 2018-05-30 MED ORDER — MIRTAZAPINE 15 MG PO TBDP: 15.0000 mg | ORAL_TABLET | Freq: Every day | ORAL | 0 refills | Status: DC

## 2018-05-30 MED ORDER — PREDNISONE 20 MG PO TABS: 20.0000 mg | ORAL_TABLET | Freq: Every day | ORAL | 0 refills | Status: DC

## 2018-05-30 MED ORDER — FUROSEMIDE 40 MG PO TABS: 40.0000 mg | ORAL_TABLET | Freq: Every day | ORAL | 0 refills | Status: DC | PRN

## 2018-05-30 MED ORDER — LORATADINE 10 MG PO TABS: 10.0000 mg | ORAL_TABLET | Freq: Every day | ORAL | 0 refills | Status: AC

## 2018-05-30 MED ORDER — LISINOPRIL 5 MG PO TABS: 5.0000 mg | ORAL_TABLET | Freq: Every day | ORAL | 0 refills | Status: DC

## 2018-05-30 MED ORDER — PANTOPRAZOLE SODIUM 40 MG PO TBEC: 40.0000 mg | DELAYED_RELEASE_TABLET | Freq: Every day | ORAL | 0 refills | Status: AC

## 2018-05-30 MED ORDER — MORPHINE SULFATE 10 MG/5ML PO SOLN: 5.0000 mg | Freq: Two times a day (BID) | ORAL | Status: DC | PRN

## 2018-05-30 MED ORDER — MORPHINE SULFATE 10 MG/5ML PO SOLN: 5.0000 mg | Freq: Two times a day (BID) | ORAL | 0 refills | Status: DC | PRN

## 2018-05-30 MED ORDER — GUAIFENESIN-DM 100-10 MG/5ML PO SYRP: 10.0000 mL | ORAL_SOLUTION | ORAL | 0 refills | Status: AC | PRN

## 2018-05-30 MED ORDER — FLUTICASONE PROPIONATE 50 MCG/ACT NA SUSP: 2.0000 | Freq: Every day | NASAL | 0 refills | Status: AC

## 2018-05-30 MED ORDER — BUSPIRONE HCL 5 MG PO TABS: 5.0000 mg | ORAL_TABLET | Freq: Two times a day (BID) | ORAL | Status: DC

## 2018-05-30 MED ORDER — HYDROXYZINE HCL 10 MG PO TABS: 10.0000 mg | ORAL_TABLET | Freq: Three times a day (TID) | ORAL | 0 refills | Status: DC | PRN

## 2018-05-30 MED ORDER — IPRATROPIUM-ALBUTEROL 0.5-2.5 (3) MG/3ML IN SOLN: 3.0000 mL | Freq: Four times a day (QID) | RESPIRATORY_TRACT | 0 refills | Status: AC | PRN

## 2018-05-30 NOTE — Progress Notes (Deleted)
SATURATION QUALIFICATIONS: (This note is used to comply with regulatory documentation for home oxygen)  Patient Saturations on Room Air at Rest 86  Patient Saturations on Room Air while Ambulating 80-84  Patient Saturations on  Liters of oxygen while Ambulation ( NOT Ordered)  Please briefly explain why patient needs home oxygen: O2 Sat drops and HR goes from 103-119. Back on 2 l nasal cannula O2 SAT 97%

## 2018-05-30 NOTE — Progress Notes (Signed)
Pt transported to the family car in a wheelchair. Mr Amber Santiago and his daughter verbalized understanding of D/C instuctions

## 2018-05-30 NOTE — Progress Notes (Signed)
SATURATION QUALIFICATIONS: (This note is used to comply with regulatory documentation for home oxygen)  Patient Saturations on Room Air at Rest   86%   Patient Saturations on Room Air while Ambulation 80  Patient Saturations on 2 Liters O2  97%.  Please briefly explain why patient needs home oxygen:She desats when off O2

## 2018-05-30 NOTE — Care Management Note (Signed)
Case Management Note  Patient Details  Name: Amber Santiago MRN: [id number] Date of Birth: 09-05-1945  Per desaturation screen done by nursing pt would qualify for continuous home 02. MD order received for home 02. This CM confirmed with daughter [name] that they use [facility] for nocturnal home 02 currently. This CM contacted [facility] to alert them of new need for continuous 02, and need for travel tank to dc home. This CM also asked the [facility] rep to check pt current home concentrator because she reports that it quits working sometimes. [facility] rep confirms that they will check home concentrator and deliver travel tank to pt hospital room.  Expected Discharge Date:                  Expected Discharge Plan:     In-House Referral:     Discharge planning Services     Post Acute Care Choice:    Choice offered to:     DME Arranged:    DME Agency:     HH Arranged:    HH Agency:     Status of Service:     If discussed at [employer], dates discussed:    Additional CommentsLynnell Catalan, RN 05/30/2018, 2:23 PM  [phone]

## 2018-05-30 NOTE — Discharge Summary (Signed)
Physician Discharge Summary  Amber Santiago OIN:[id number] DOB: 1946/03/14 DOA: 05/27/2018  PCP: Simona Huh, NP  Admit date: 05/27/2018 Discharge date: 05/30/2018  Time spent: 60 minutes  Recommendations for Outpatient Follow-up:  1. Follow-up with pulmonologist as scheduled on Tuesday, June 03, 2018.  On follow-up patient COPD will need to be reassessed.  Patient was discharged home on continuous home O2. 2. Follow-up with Simona Huh, NP in 2 weeks.  On follow-up patient blood pressure did need to be reassessed as patient's lisinopril was increased to 5 mg daily.  Patient's diuretics of Lasix was held during the hospitalization due to hyponatremia.  Lasix was changed to as needed.  Patient will need a basic metabolic profile done to follow-up on electrolytes and renal function. 3. Patient be discharged home, palliative care to follow-up in the outpatient setting.   Discharge Diagnoses:  Principal Problem:   Acute on chronic respiratory failure with hypoxia (HCC) Active Problems:   COPD exacerbation (HCC)   Tobacco use disorder   Squamous cell lung cancer ([place])   Essential hypertension   Anxiety   Lymphedema of both lower extremities   Hyponatremia   Discharge Condition: Stable and improved  Diet recommendation: Regular  Filed Weights   05/28/18 0529 05/29/18 0517 05/30/18 0500  Weight: 60.8 kg 59.2 kg 59 kg    History of present illness:  Per Dr. Ronnie Doss is a 73 y.o. female with medical history significant of with a history of COPD, chronic nocturnal hypoxic respite failure on 2 L of oxygen, history of squamous cell lung cancer right upper lobe status post XRT, hypertension, chronic lower extremity lymphedema presented to the ER with 4 to 5 days of worsening shortness of breath and cough.  Patient had been unable to participate in physical therapy due to worsening cough and shortness of breath.  Patient stated that she gets quite winded with even minimal  activity.  She denied any fever or chills.  Cough was worse.  She was still smoking 1 pack/day despite her history of hypoxic respiratory failure, COPD and prior lung cancer.  She was evaluated in the ER.  She was noted to have worsening hypoxia.  She was also noted to have worsening wheezing and mild respiratory distress.  She was transferred to [facility] for further management.   ED Course: While at [facility], patient received continuous albuterol treatment, IV Solu-Medrol and supplemental oxygen.  Hospital Course:  1 acute on chronic respiratory failure with hypoxia secondary to acute COPD exacerbation Patient presented with worsening shortness of breath, productive cough, hypoxia with increasing O2 requirements from her baseline.  Chest x-ray negative for any acute infiltrate.  Respiratory viral panel negative.    Patient placed on IV Solu-Medrol, scheduled nebs, Pulmicort, Claritin, Flonase, PPI, Mucinex, doxycycline.  Patient improved clinically and IV steroids were tapered down and patient be discharged home on oral steroid taper.  Patient also be discharged home on 5 more days of oral doxycycline as well as duo nebs 3 times daily for the next 4 to 5 days and then as needed.  Patient was discharged home in stable and improved condition on home O2 and is to follow-up with her pulmonologist as scheduled on Tuesday June 03, 2018.    2.  Hypertension Saline lock IV fluids.  Patient was resumed back on lisinopril 2.5 mg daily which was uptitrated to 5 mg daily for better blood pressure control.  Patient be discharged home on lisinopril 5 mg daily.  Outpatient follow-up with PCP.   3.  Hyponatremia Likely secondary to hypovolemic hyponatremia in the setting of diuretics versus SIADH in the setting of prior history of lung cancer.  Urine sodium was 77, urine osmolality of 258, urine creatinine of 14.39.  TSH within normal limits at 0.557.  Free T4 at 1.40.  Patient's  diuretics were held during the hospitalization and patient hydrated gently with IV fluids.  Hyponatremia improved such that by day of discharge sodium was down to 134.  Patient's diuretics will be changed to as needed on discharge.  Outpatient follow-up with PCP.    4.  Anxiety Patient was placed back on BuSpar 5 mg twice daily which was half home dose noted per family request.  Patient did receive a dose of IV Ativan during the hospitalization however that led to significant confusion and as such Ativan discontinued.  Patient placed on hydroxyzine 10 mg p.o. 3 times daily as needed for anxiety.  Outpatient follow-up with PCP.    5.  History of squamous cell lung cancer Outpatient follow-up.  6.  Lymphedema of bilateral lower extremities Outpatient follow-up.  7.  Tobacco abuse Patient smokes a pack per day.  Nicotine patch was offered however patient refused.  Tobacco cessation stressed to patient.  Patient seems to be resistant to stop smoking.  Outpatient follow-up.  8.  History of AAA Outpatient follow-up with vascular surgery.  9.  Macular degeneration bilateral Patient maintained on home eyedrops.  10.  Prognosis Patient with a likely poor prognosis as patient with COPD on chronic home O2 with a history of squamous cell lung cancer, lymphedema of bilateral lower extremities, ongoing tobacco use, history of AAA.  Patient also noted with poor oral intake.  Family concerned about patient's prognosis.  Palliative care was consulted for goals of care.  Outpatient follow-up with palliative care.    Procedures:  Chest x-ray 05/27/2018  Consultations:  Palliative care: Florentina Jenny, PA 05/30/2018  Discharge Exam: Vitals:   05/30/18 1235 05/30/18 1431  BP:  (!) 148/89  Pulse:  89  Resp:  20  Temp:  98.4 F (36.9 C)  SpO2: 96% 96%    General: NAD Cardiovascular: Regular rate rhythm Respiratory: No wheezing.  Poor to fair air movement.  Discharge  Instructions   Discharge Instructions    Diet general   Complete by:  As directed    Increase activity slowly   Complete by:  As directed      Allergies as of 05/30/2018      Reactions   Amoxicillin Anaphylaxis   Did it involve swelling of the face/tongue/throat, SOB, or low BP? Yes Did it involve sudden or severe rash/hives, skin peeling, or any reaction on the inside of your mouth or nose? Unknown Did you need to seek medical attention at a hospital or doctor's office? Yes When did it last happen?Not in last 10 years If all above answers are "NO", may proceed with cephalosporin use.   Ativan [lorazepam] Other (See Comments)   Caused hallucinations , confusion   Penicillins Anaphylaxis   Has patient had a PCN reaction causing immediate rash, facial/tongue/throat swelling, SOB or lightheadedness with hypotension: yes Has patient had a PCN reaction causing severe rash involving mucus membranes or skin necrosis: unknown Has patient had a PCN reaction that required hospitalization: office visist Has patient had a PCN reaction occurring within the last 10 years: no If all of the above answers are "NO", then may proceed with Cephalosporin use.   Prednisone  Other (See Comments)   Made pt feel very spacey and out of it      Medication List    STOP taking these medications   AEROCHAMBER MV inhaler   Potassium Chloride ER 20 MEQ Tbcr     TAKE these medications   albuterol 108 (90 Base) MCG/ACT inhaler Commonly known as:  PROAIR HFA Inhale 2 puffs into the lungs every 6 (six) hours as needed for wheezing or shortness of breath.   BESIVANCE 0.6 % Susp Generic drug:  Besifloxacin HCl Place 1 drop into the right eye See admin instructions. Take 1 drop in the right eye three times daily on the day of Age Macular Degeneration procedure and 1 drop four times daily the day after - AMD procedure occurs once a month   budesonide-formoterol 160-4.5 MCG/ACT inhaler Commonly known as:   SYMBICORT INHALE TWO PUFFS INTO LUNGS TWICE DAILY What changed:    how much to take  how to take this  when to take this  additional instructions   busPIRone 5 MG tablet Commonly known as:  BUSPAR Take 1 tablet (5 mg total) by mouth 2 (two) times daily. What changed:    medication strength  how much to take   dorzolamide 2 % ophthalmic solution Commonly known as:  TRUSOPT INSTILL 2 DROPS INTO BOTH EYES TWICE A DAY What changed:  See the new instructions.   doxycycline 100 MG tablet Commonly known as:  VIBRA-TABS Take 1 tablet (100 mg total) by mouth every 12 (twelve) hours for 5 days.   fluticasone 50 MCG/ACT nasal spray Commonly known as:  FLONASE Place 2 sprays into both nostrils daily. Start taking on:  May 31, 2018   furosemide 40 MG tablet Commonly known as:  LASIX Take 1 tablet (40 mg total) by mouth daily as needed. What changed:    when to take this  reasons to take this   guaiFENesin-dextromethorphan 100-10 MG/5ML syrup Commonly known as:  ROBITUSSIN DM Take 10 mLs by mouth every 4 (four) hours as needed for cough.   hydrOXYzine 10 MG tablet Commonly known as:  ATARAX/VISTARIL Take 1 tablet (10 mg total) by mouth 3 (three) times daily as needed for anxiety.   ipratropium-albuterol 0.5-2.5 (3) MG/3ML Soln Commonly known as:  DUONEB Take 3 mLs by nebulization every 6 (six) hours as needed (shortness of breath). Use 3 times daily x5 days, then every 6 hours as needed. What changed:  additional instructions   lisinopril 5 MG tablet Commonly known as:  PRINIVIL,ZESTRIL Take 1 tablet (5 mg total) by mouth daily. Start taking on:  May 31, 2018 What changed:    medication strength  how much to take   loratadine 10 MG tablet Commonly known as:  CLARITIN Take 1 tablet (10 mg total) by mouth daily. Start taking on:  May 31, 2018   LOTEMAX OP Place 1 drop into the right eye 4 (four) times daily.   mirtazapine 15 MG disintegrating  tablet Commonly known as:  REMERON SOL-TAB Take 1 tablet (15 mg total) by mouth at bedtime.   morphine 10 MG/5ML solution Take 2.5 mLs (5 mg total) by mouth 2 (two) times daily as needed (air hunger or dyspnea).   pantoprazole 40 MG tablet Commonly known as:  PROTONIX Take 1 tablet (40 mg total) by mouth daily. Start taking on:  May 31, 2018   predniSONE 20 MG tablet Commonly known as:  DELTASONE Take 1-3 tablets (20-60 mg total) by mouth daily with breakfast.  Take 3 tablets (60 mg) daily x3 days, then 2 tablets (40 mg) daily x3 days, then 1 tablet (20 mg) daily x3 days then stop.   PRESERVISION AREDS 2 PO Take 2 capsules by mouth daily. Reported on 07/05/2015   ROCKLATAN OP Place 1 drop into both eyes at bedtime.            Durable Medical Equipment  (From admission, onward)         Start     Ordered   05/30/18 1618  For home use only DME Walker rolling  Once    Question:  Patient needs a walker to treat with the following condition  Answer:  Weakness   05/30/18 1618   05/30/18 1609  For home use only DME 4 wheeled rolling walker with seat  Once    Question:  Patient needs a walker to treat with the following condition  Answer:  Debility   05/30/18 1608   05/30/18 1443  For home use only DME oxygen  Once    Question Answer Comment  Mode or (Route) Nasal cannula   Liters per Minute 2   Frequency Continuous (stationary and portable oxygen unit needed)   Oxygen conserving device Yes   Oxygen delivery system Gas      05/30/18 1442   05/30/18 1420  For home use only DME oxygen  Once    Question Answer Comment  Mode or (Route) Nasal cannula   Liters per Minute 2   Frequency Continuous (stationary and portable oxygen unit needed)   Oxygen conserving device Yes   Oxygen delivery system Gas      05/30/18 1420         Allergies  Allergen Reactions  . Amoxicillin Anaphylaxis    Did it involve swelling of the face/tongue/throat, SOB, or low BP? Yes Did it  involve sudden or severe rash/hives, skin peeling, or any reaction on the inside of your mouth or nose? Unknown Did you need to seek medical attention at a hospital or doctor's office? Yes When did it last happen?Not in last 10 years If all above answers are "NO", may proceed with cephalosporin use.  . Ativan [Lorazepam] Other (See Comments)    Caused hallucinations , confusion  . Penicillins Anaphylaxis    Has patient had a PCN reaction causing immediate rash, facial/tongue/throat swelling, SOB or lightheadedness with hypotension: yes Has patient had a PCN reaction causing severe rash involving mucus membranes or skin necrosis: unknown Has patient had a PCN reaction that required hospitalization: office visist Has patient had a PCN reaction occurring within the last 10 years: no If all of the above answers are "NO", then may proceed with Cephalosporin use.   . Prednisone Other (See Comments)    Made pt feel very spacey and out of it   Follow-up [facility] Follow up.   Why:  For palliative care program called [facility] Contact information: [address] [place] [address] [phone]        [facility] Follow up.   Specialty:  Home Health Services Why:  For continued home health services Contact information: [place] [address] [phone]        [facility] Follow [facility].   Why:  For home oxygen Contact information: [phone]       Simona Huh, NP. Schedule an appointment as soon as possible for a visit in 2 week(s).   Specialty:  Nurse Practitioner Contact information: [place] [address] [phone]        Pulmonary Follow up on 06/03/2018.   Why:  Follow-up as scheduled.           The results of significant diagnostics from this hospitalization (including imaging, microbiology, ancillary and laboratory) are listed below for reference.     Significant Diagnostic Studies: Dg Chest Port 1 View  Result Date: 05/29/2018 CLINICAL DATA:  COPD with history of squamous cell lung cancer in the right upper lobe status post radiation. Chest pressure. EXAM: PORTABLE CHEST 1 VIEW COMPARISON:  05/27/2018 and 01/17/2018 CT FINDINGS: Confluent opacity at the right lung apex is unchanged. Upper thoracic kyphoplasty changes are redemonstrated, 2 level. Stable cardiomegaly with mild uncoiling of the thoracic aorta. No acute pulmonary consolidation, effusion or pneumothorax. IMPRESSION: Stable cardiomegaly with aortic atherosclerosis. Stable pulmonary confluence at the right lung apex. No significant change. Electronically Signed   By: Ashley Royalty M.D.   On: 05/29/2018 16:35   Dg Chest Port 1 View  Result Date: 05/27/2018 CLINICAL DATA:  Smoker. Pneumonia. Lung cancer. COPD. Worsening shortness of breath since last week. EXAM: PORTABLE CHEST 1 VIEW COMPARISON:  01/17/2018 CT. FINDINGS: Hyperinflation. Mild tracheal deviation to the right. No pleural effusion or pneumothorax. Normal heart size. Right upper lobe/apical consolidation with volume loss and hilar retraction. Grossly similar to the 01/17/2018 scout. Diffuse interstitial thickening. No new pulmonary opacity. Upper thoracic vertebral augmentation x2. IMPRESSION: No significant change compared to the scout film of the 01/17/2018 CT. Similar right upper lobe/apical consolidation with volume loss. Hyperinflation with interstitial thickening, likely related to COPD/chronic bronchitis. Electronically Signed   By: Abigail Miyamoto M.D.   On: 05/27/2018 18:46    Microbiology: Recent Results (from the past 240 hour(s))  Respiratory Panel by PCR     Status: None   Collection Time: 05/28/18  5:42 PM  Result Value Ref Range Status   Adenovirus NOT DETECTED NOT DETECTED Final   Coronavirus 229E NOT DETECTED NOT DETECTED Final    Comment: (NOTE) The Coronavirus on the Respiratory Panel, DOES NOT test for the  novel  Coronavirus (2019 nCoV)    Coronavirus HKU1 NOT DETECTED NOT DETECTED Final   Coronavirus NL63 NOT DETECTED NOT DETECTED Final   Coronavirus OC43 NOT DETECTED NOT DETECTED Final   Metapneumovirus NOT DETECTED NOT DETECTED Final   Rhinovirus / Enterovirus NOT DETECTED NOT DETECTED Final   Influenza A NOT DETECTED NOT DETECTED Final   Influenza B NOT DETECTED NOT DETECTED Final   Parainfluenza Virus 1 NOT DETECTED NOT DETECTED Final   Parainfluenza Virus 2 NOT DETECTED NOT DETECTED Final   Parainfluenza Virus 3 NOT DETECTED NOT DETECTED Final   Parainfluenza Virus 4 NOT DETECTED NOT DETECTED Final   Respiratory Syncytial Virus NOT DETECTED NOT DETECTED Final   Bordetella pertussis NOT DETECTED NOT DETECTED Final   Chlamydophila pneumoniae NOT DETECTED NOT DETECTED Final   Mycoplasma pneumoniae NOT DETECTED NOT DETECTED Final    Comment: Performed at [facility], [place]. [address], [place], [place] [address]  Culture, Urine     Status: Abnormal   Collection Time: 05/29/18  9:00 AM  Result Value Ref Range Status   Specimen Description   Final    URINE, RANDOM Performed at [facility] [address] [place], [place] [address]    Special Requests   Final    NONE Performed at [facility], [place] [address] [place], [place]  [address]    Culture (A)  Final    <10,000 COLONIES/mL INSIGNIFICANT GROWTH Performed at [facility] [address], [place], [place] [address]    Report Status 05/30/2018 FINAL  Final     Labs: Basic Metabolic Panel: Recent Labs  Lab 05/27/18 1757 05/27/18 1802 05/28/18 0319 05/28/18 1409 05/29/18 0408 05/30/18 0355  NA 127* 126* 126* 128* 130* 134*  K 3.7 3.8 3.8 4.1 4.0 4.0  CL 87*  --  89* 90* 93* 92*  CO2 30  --  26 29 27  34*  GLUCOSE 98  --  165* 153* 144* 128*  BUN 7*  --  7* 9 8 10   CREATININE 0.36*  --  0.42*  0.41* 0.48 0.39* 0.47  CALCIUM 8.4*  --  8.4* 8.3* 8.4* 8.6*   Liver  Function Tests: Recent Labs  Lab 05/27/18 1757 05/28/18 0319  AST 42* 41  ALT 19 19  ALKPHOS 120 110  BILITOT 0.8 0.5  PROT 7.5 7.0  ALBUMIN 3.8 3.4*   No results for input(s): LIPASE, AMYLASE in the last 168 hours. No results for input(s): AMMONIA in the last 168 hours. CBC: Recent Labs  Lab 05/27/18 1757 05/27/18 1802 05/28/18 0319 05/29/18 0408 05/30/18 0355  WBC 8.5  --  12.8* 12.4* 10.6*  NEUTROABS 7.5  --  12.4* 11.6*  --   HGB 12.5 13.6 11.4* 11.5* 12.0  HCT 39.3 40.0 35.7* 36.2 38.2  MCV 88.3  --  88.6 90.7 90.7  PLT 271  --  290 258 276   Cardiac Enzymes: Recent Labs  Lab 05/27/18 1757  TROPONINI <0.03   BNP: BNP (last 3 results) No results for input(s): BNP in the last 8760 hours.  ProBNP (last 3 results) No results for input(s): PROBNP in the last 8760 hours.  CBG: Recent Labs  Lab 05/29/18 1654 05/29/18 2050 05/30/18 0736 05/30/18 1126 05/30/18 1542  GLUCAP 142* 144* 99 132* 172*       Signed:  Irine Seal MD.  [facility] 05/30/2018, 4:19 PM

## 2018-05-30 NOTE — Care Management Important Message (Signed)
Important Message  Patient Details  Name: Amber Santiago MRN: [id number] Date of Birth: 06/19/1945   Medicare Important Message Given:  Yes    [name] 05/30/2018, 11:20 [place] Message  Patient Details  Name: Amber Santiago MRN: [id number] Date of Birth: 06-08-1945   Medicare Important Message Given:  Yes    [name] 05/30/2018, 11:20 AM

## 2018-05-30 NOTE — Progress Notes (Signed)
Occupational Therapy Treatment Patient Details Name: Amber Santiago MRN: [id number] DOB: 11/04/1945 Today's Date: 05/30/2018    History of present illness 73 y.o. female admitted with COPD exac. 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    OT comments  This 73 yo female admitted with above presents to acute OT today with education completed on energy conservation with pt and her dtr (primary caregiver). Pt will continue to benefit from acute OT with follow up HHOT and 24 hour S/prn A.  Follow Up Recommendations  Home health OT;Supervision/Assistance - 24 hour;Other (comment)(HHAide)    Equipment Recommendations  None recommended by OT       Precautions / Restrictions Precautions Precautions: Fall Precaution Comments: monitor O2 Restrictions Weight Bearing Restrictions: No              ADL either performed or assessed with clinical judgement   ADL                                         General ADL Comments: Took energy conservation handout in to go over with pt and dtr. Dtr was very receptive and said yes and shook her head yes to what I was explaining to her about ways to conserve energy that would defintitely be good in short term and maybe longer term. Dtr and pt report that pt wants to be as independent as possible for as long as possible and I encouraged this while at the same time speaking to patient about listening to what her body is telling her and rest when she truly needs to--especially now while recovering from this hospitalization and now needing O2 24/7 instead of only at night.     Vision Patient Visual Report: No change from baseline            Cognition Arousal/Alertness: Lethargic                                                         Pertinent Vitals/  Pain       Pain Score: 0-No pain         Frequency  Min 2X/week        Progress Toward Goals  OT Goals(current goals can now be found in the care plan section)  Progress towards OT goals: Progressing toward goals  ADL Goals Additional ADL Goal #1: Pt and dtr will be aware of energy conservation strategies from handout that will of benefit to pt.  Plan Discharge plan remains appropriate       AM-PAC OT "6 Clicks" Daily Activity     Outcome Measure   Help from another person eating meals?: None Help from another person taking care of personal grooming?: A Little Help from another person toileting, which includes using toliet, bedpan, or urinal?: A Little Help from another person bathing (including washing, rinsing, drying)?: A Little Help from another person to put on and taking off regular upper body clothing?: A Little Help from another person to put on and taking off regular lower body clothing?: A Lot 6 Click Score: 18  End of Session Equipment Utilized During Treatment: Rolling walker;Oxygen(2 liters)  OT Visit Diagnosis: Other abnormalities of gait and mobility (R26.89);Muscle weakness (generalized) (M62.81)   Activity Tolerance Patient limited by lethargy(but dtr totally aware of what we were discussing)   Patient Left in chair;with call bell/phone within reach;with family/visitor present           Time: 7841-2820 OT Time Calculation (min): 37 min  Charges: OT General Charges $OT Visit: 1 Visit OT Treatments $Self Care/Home Management : 23-37 mins  [address], OTR/L Acute [employer] Pager [phone] Office [phone]      Almon Register 05/30/2018, 12:28 PM

## 2018-05-30 NOTE — Progress Notes (Addendum)
Physical Therapy Treatment Patient Details Name: Amber Santiago MRN: [id number] DOB: 07-Oct-1945 Today's Date: 05/30/2018    History of Present Illness 73 y.o. female admitted with COPD exac. 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     PT Comments    Family present in room-high anxiety-some bickering between siblings about how they will care for pt at home. Encouraged family to consider having pt sleep in recliner and sponge bathe on 1st level of home until HHPT/OT can assess pt's capabilities in her home environment. Noted pt to still be drowsy on today-she easily dozed off. Walked a short distance around room. Remained on [place] O2-sats >90%. Plan is for d/c home later today per family.     Follow Up Recommendations  Home health PT;Supervision/Assistance - 24 hour     Equipment Recommendations  Rolling walker with 5" wheels(would benefit from RW use if pt agreeable)    Recommendations for Other Services       Precautions / Restrictions Precautions Precautions: Fall Precaution Comments: monitor O2 Restrictions Weight Bearing Restrictions: No    Mobility  Bed Mobility               General bed mobility comments: oob in recliner  Transfers Overall transfer level: Needs assistance   Transfers: Sit to/from Stand Sit to Stand: Min guard         General transfer comment: for safety.   Ambulation/Gait Ambulation/Gait assistance: Min assist;Min guard Gait Distance (Feet): 25 Feet Assistive device: 1 person hand held assist Gait Pattern/deviations: Step-through pattern;Decreased stride length     General Gait Details: Pt walked a short distance in room. Intermittent assist to steady. Remained on  O2-sat level 94% on 2L.   Stairs             Wheelchair Mobility    Modified Rankin (Stroke Patients  Only)       Balance Overall balance assessment: Mild deficits observed, not formally tested           Standing balance-Leahy Scale: Fair                              Cognition Arousal/Alertness: (drowsy) Behavior During Therapy: WFL for tasks assessed/performed Overall Cognitive Status: Within Functional Limits for tasks assessed                                        Exercises General Exercises - Lower Extremity Long Arc Quad: AROM;Both;10 reps;Seated Hip Flexion/Marching: AROM;Both;10 reps;Seated    General Comments        Pertinent Vitals/Pain Pain Score: 0-No pain Faces Pain Scale: No hurt    Home Living                      Prior Function            PT Goals (current goals can now be found in the care plan section) Progress towards PT goals: Progressing toward goals    Frequency    Min 3X/week      PT Plan Current plan remains appropriate    Co-evaluation              AM-PAC PT "6 Clicks"  Mobility   Outcome Measure  Help needed turning from your back to your side while in a flat bed without using bedrails?: A Little Help needed moving from lying on your back to sitting on the side of a flat bed without using bedrails?: A Little Help needed moving to and from a bed to a chair (including a wheelchair)?: A Little Help needed standing up from a chair using your arms (e.g., wheelchair or bedside chair)?: A Little Help needed to walk in hospital room?: A Little Help needed climbing 3-5 steps with a railing? : A Lot 6 Click Score: 17    End of Session Equipment Utilized During Treatment: Oxygen Activity Tolerance: Patient limited by fatigue Patient left: in chair;with call bell/phone within reach;with family/visitor present   PT Visit Diagnosis: Difficulty in walking, not elsewhere classified (R26.2);Muscle weakness (generalized) (M62.81);Unsteadiness on feet (R26.81)     Time: 1425-1500 PT Time  Calculation (min) (ACUTE ONLY): 35 min  Charges:  $Gait Training: 8-22 mins $Therapeutic Exercise: 8-22 mins                        Weston Anna, PT Acute Rehabilitation Services Pager: [phone] Office: [phone]

## 2018-06-04 MED FILL — DORZOLAMIDE HCL 2% EYE DRP: 2 | 37 days supply | Qty: 10 | Fill #6

## 2018-06-06 DIAGNOSIS — Z515 Encounter for palliative care: Secondary | ICD-10-CM

## 2018-06-06 DIAGNOSIS — Z7189 Other specified counseling: Secondary | ICD-10-CM

## 2018-06-19 ENCOUNTER — Encounter (INDEPENDENT_AMBULATORY_CARE_PROVIDER_SITE_OTHER): Payer: Medicare HMO | Admitting: Ophthalmology

## 2018-06-19 ENCOUNTER — Other Ambulatory Visit: Payer: Self-pay

## 2018-06-19 DIAGNOSIS — H43813 Vitreous degeneration, bilateral: Secondary | ICD-10-CM

## 2018-06-19 DIAGNOSIS — H35033 Hypertensive retinopathy, bilateral: Secondary | ICD-10-CM | POA: Diagnosis not present

## 2018-06-19 DIAGNOSIS — I10 Essential (primary) hypertension: Secondary | ICD-10-CM

## 2018-06-19 DIAGNOSIS — H353231 Exudative age-related macular degeneration, bilateral, with active choroidal neovascularization: Secondary | ICD-10-CM | POA: Diagnosis not present

## 2018-06-20 ENCOUNTER — Telehealth: Payer: Self-pay

## 2018-06-20 NOTE — Telephone Encounter (Signed)
Phone call placed to patient's daughter to introduce Palliative Care and offer to schedule visit with NP. VM left

## 2018-06-20 NOTE — Telephone Encounter (Signed)
Phone call placed to patient to offer to schedule visit with Palliative Care. VM left

## 2018-06-23 MED FILL — LOTEMAX 0.5% GEL: 0.5 | 18 days supply | Qty: 5 | Fill #1

## 2018-06-23 MED FILL — DORZOLAMIDE HCL 2% EYE DRP: 2 | 37 days supply | Qty: 10 | Fill #7

## 2018-06-24 ENCOUNTER — Telehealth: Payer: Self-pay

## 2018-06-24 NOTE — Telephone Encounter (Signed)
Phone call placed to patient to offer to schedule visit with Palliative Care. Unable to leave message

## 2018-06-30 ENCOUNTER — Telehealth: Payer: Self-pay

## 2018-06-30 NOTE — Telephone Encounter (Signed)
Attempted to contact patient to offer to schedule visit with Palliative Care. Unable to leave VM as mailbox is full.

## 2018-07-01 ENCOUNTER — Telehealth: Payer: Self-pay

## 2018-07-01 NOTE — Telephone Encounter (Signed)
Received VM from Tammy with [facility]. Returned call to [facility] who shared that patient is enrolled in their palliative care program. Will alert [facility] and sign off.

## 2018-07-24 ENCOUNTER — Encounter (INDEPENDENT_AMBULATORY_CARE_PROVIDER_SITE_OTHER): Payer: Medicare HMO | Admitting: Ophthalmology

## 2018-07-31 ENCOUNTER — Encounter (INDEPENDENT_AMBULATORY_CARE_PROVIDER_SITE_OTHER): Payer: Medicare Other | Admitting: Ophthalmology

## 2018-07-31 ENCOUNTER — Other Ambulatory Visit: Payer: Self-pay

## 2018-07-31 DIAGNOSIS — I10 Essential (primary) hypertension: Secondary | ICD-10-CM

## 2018-07-31 DIAGNOSIS — H43813 Vitreous degeneration, bilateral: Secondary | ICD-10-CM

## 2018-07-31 DIAGNOSIS — H35033 Hypertensive retinopathy, bilateral: Secondary | ICD-10-CM | POA: Diagnosis not present

## 2018-07-31 DIAGNOSIS — H353231 Exudative age-related macular degeneration, bilateral, with active choroidal neovascularization: Secondary | ICD-10-CM

## 2018-09-11 ENCOUNTER — Encounter (INDEPENDENT_AMBULATORY_CARE_PROVIDER_SITE_OTHER): Payer: Medicare HMO | Admitting: Ophthalmology

## 2018-09-26 ENCOUNTER — Other Ambulatory Visit: Payer: Self-pay | Admitting: Hematology and Oncology

## 2018-09-26 DIAGNOSIS — M546 Pain in thoracic spine: Secondary | ICD-10-CM

## 2018-09-30 ENCOUNTER — Other Ambulatory Visit: Payer: Self-pay

## 2018-09-30 ENCOUNTER — Encounter (INDEPENDENT_AMBULATORY_CARE_PROVIDER_SITE_OTHER): Payer: Medicare Other | Admitting: Ophthalmology

## 2018-09-30 DIAGNOSIS — H353231 Exudative age-related macular degeneration, bilateral, with active choroidal neovascularization: Secondary | ICD-10-CM | POA: Diagnosis not present

## 2018-09-30 DIAGNOSIS — H43813 Vitreous degeneration, bilateral: Secondary | ICD-10-CM

## 2018-10-02 MED FILL — BESIVANCE 0.6% SUSP: 0.6 | 18 days supply | Qty: 5 | Fill #1

## 2018-10-02 MED FILL — DORZOLAMIDE HCL 2% EYE DRP: 2 | 30 days supply | Qty: 10 | Fill #0

## 2018-10-28 ENCOUNTER — Encounter (INDEPENDENT_AMBULATORY_CARE_PROVIDER_SITE_OTHER): Payer: Medicare Other | Admitting: Ophthalmology

## 2018-11-06 ENCOUNTER — Encounter (HOSPITAL_COMMUNITY): Payer: Self-pay | Admitting: Emergency Medicine

## 2018-11-06 ENCOUNTER — Emergency Department (HOSPITAL_COMMUNITY)

## 2018-11-06 ENCOUNTER — Observation Stay (HOSPITAL_COMMUNITY)
Admission: EM | Admit: 2018-11-06 | Discharge: 2018-11-07 | Disposition: A | Discharge status: Home / Self Care | Attending: Internal Medicine | Admitting: Internal Medicine

## 2018-11-06 DIAGNOSIS — Z66 Do not resuscitate: Secondary | ICD-10-CM | POA: Insufficient documentation

## 2018-11-06 DIAGNOSIS — E0590 Thyrotoxicosis, unspecified without thyrotoxic crisis or storm: Secondary | ICD-10-CM | POA: Diagnosis not present

## 2018-11-06 DIAGNOSIS — J449 Chronic obstructive pulmonary disease, unspecified: Secondary | ICD-10-CM | POA: Diagnosis present

## 2018-11-06 DIAGNOSIS — J159 Unspecified bacterial pneumonia: Secondary | ICD-10-CM | POA: Insufficient documentation

## 2018-11-06 DIAGNOSIS — J9622 Acute and chronic respiratory failure with hypercapnia: Secondary | ICD-10-CM | POA: Diagnosis present

## 2018-11-06 DIAGNOSIS — I714 Abdominal aortic aneurysm, without rupture: Secondary | ICD-10-CM | POA: Diagnosis not present

## 2018-11-06 DIAGNOSIS — Z1159 Encounter for screening for other viral diseases: Secondary | ICD-10-CM | POA: Insufficient documentation

## 2018-11-06 DIAGNOSIS — C3490 Malignant neoplasm of unspecified part of unspecified bronchus or lung: Secondary | ICD-10-CM | POA: Insufficient documentation

## 2018-11-06 DIAGNOSIS — Z79899 Other long term (current) drug therapy: Secondary | ICD-10-CM | POA: Diagnosis not present

## 2018-11-06 DIAGNOSIS — F17200 Nicotine dependence, unspecified, uncomplicated: Secondary | ICD-10-CM | POA: Diagnosis present

## 2018-11-06 DIAGNOSIS — N281 Cyst of kidney, acquired: Secondary | ICD-10-CM | POA: Diagnosis not present

## 2018-11-06 DIAGNOSIS — I119 Hypertensive heart disease without heart failure: Secondary | ICD-10-CM | POA: Insufficient documentation

## 2018-11-06 DIAGNOSIS — F17210 Nicotine dependence, cigarettes, uncomplicated: Secondary | ICD-10-CM | POA: Diagnosis not present

## 2018-11-06 DIAGNOSIS — L8990 Pressure ulcer of unspecified site, unspecified stage: Secondary | ICD-10-CM | POA: Insufficient documentation

## 2018-11-06 DIAGNOSIS — R627 Adult failure to thrive: Secondary | ICD-10-CM | POA: Diagnosis not present

## 2018-11-06 DIAGNOSIS — J9621 Acute and chronic respiratory failure with hypoxia: Principal | ICD-10-CM | POA: Insufficient documentation

## 2018-11-06 DIAGNOSIS — E43 Unspecified severe protein-calorie malnutrition: Secondary | ICD-10-CM | POA: Diagnosis not present

## 2018-11-06 DIAGNOSIS — J441 Chronic obstructive pulmonary disease with (acute) exacerbation: Secondary | ICD-10-CM | POA: Diagnosis not present

## 2018-11-06 DIAGNOSIS — G9341 Metabolic encephalopathy: Secondary | ICD-10-CM | POA: Diagnosis not present

## 2018-11-06 DIAGNOSIS — Z7951 Long term (current) use of inhaled steroids: Secondary | ICD-10-CM | POA: Diagnosis not present

## 2018-11-06 DIAGNOSIS — J189 Pneumonia, unspecified organism: Secondary | ICD-10-CM | POA: Diagnosis present

## 2018-11-06 DIAGNOSIS — Z7189 Other specified counseling: Secondary | ICD-10-CM

## 2018-11-06 DIAGNOSIS — R4020 Unspecified coma: Secondary | ICD-10-CM | POA: Diagnosis present

## 2018-11-06 DIAGNOSIS — I10 Essential (primary) hypertension: Secondary | ICD-10-CM | POA: Diagnosis present

## 2018-11-06 DIAGNOSIS — Z9981 Dependence on supplemental oxygen: Secondary | ICD-10-CM | POA: Diagnosis not present

## 2018-11-06 LAB — BASIC METABOLIC PANEL
Anion gap: 10 (ref 5–15)
BUN: 12 mg/dL (ref 8–23)
CO2: 36 mmol/L — ABNORMAL HIGH (ref 22–32)
Calcium: 8.3 mg/dL — ABNORMAL LOW (ref 8.9–10.3)
Chloride: 91 mmol/L — ABNORMAL LOW (ref 98–111)
Creatinine, Ser: 0.63 mg/dL (ref 0.44–1.00)
GFR calc Af Amer: >60 mL/min (ref >60)
GFR calc non Af Amer: >60 mL/min (ref >60)
Glucose, Bld: 118 mg/dL — ABNORMAL HIGH (ref 70–99)
Potassium: 4.1 mmol/L (ref 3.5–5.1)
Sodium: 137 mmol/L (ref 135–145)

## 2018-11-06 LAB — CBC WITH DIFFERENTIAL/PLATELET
Abs Immature Granulocytes: 0.09 10*3/uL — ABNORMAL HIGH (ref 0.00–0.07)
Basophils Absolute: 0 10*3/uL (ref 0.0–0.1)
Basophils Relative: 0 %
Eosinophils Absolute: 0 10*3/uL (ref 0.0–0.5)
Eosinophils Relative: 0 %
HCT: 39.2 % (ref 36.0–46.0)
Hemoglobin: 12 g/dL (ref 12.0–15.0)
Immature Granulocytes: 1 %
Lymphocytes Relative: 4 %
Lymphs Abs: 0.6 10*3/uL — ABNORMAL LOW (ref 0.7–4.0)
MCH: 28.1 pg (ref 26.0–34.0)
MCHC: 30.6 g/dL (ref 30.0–36.0)
MCV: 91.8 fL (ref 80.0–100.0)
Monocytes Absolute: 0.8 10*3/uL (ref 0.1–1.0)
Monocytes Relative: 6 %
Neutro Abs: 13 10*3/uL — ABNORMAL HIGH (ref 1.7–7.7)
Neutrophils Relative %: 89 %
Platelets: 302 10*3/uL (ref 150–400)
RBC: 4.27 MIL/uL (ref 3.87–5.11)
RDW: 15.4 % (ref 11.5–15.5)
WBC: 14.5 10*3/uL — ABNORMAL HIGH (ref 4.0–10.5)
nRBC: 0 % (ref 0.0–0.2)

## 2018-11-06 LAB — SARS CORONAVIRUS 2 BY RT PCR (HOSPITAL ORDER, PERFORMED IN ~~LOC~~ HOSPITAL LAB): SARS Coronavirus 2: NEGATIVE

## 2018-11-06 LAB — POCT I-STAT 7, (LYTES, BLD GAS, ICA,H+H)
Acid-Base Excess: 14 mmol/L — ABNORMAL HIGH (ref 0.0–2.0)
Bicarbonate: 44.6 mmol/L — ABNORMAL HIGH (ref 20.0–28.0)
Calcium, Ion: 1.19 mmol/L (ref 1.15–1.40)
HCT: 37 % (ref 36.0–46.0)
Hemoglobin: 12.6 g/dL (ref 12.0–15.0)
O2 Saturation: 99 %
Potassium: 3.8 mmol/L (ref 3.5–5.1)
Sodium: 133 mmol/L — ABNORMAL LOW (ref 135–145)
TCO2: 47 mmol/L — ABNORMAL HIGH (ref 22–32)
pCO2 arterial: 95.4 mmHg (ref 32.0–48.0)
pH, Arterial: 7.277 — ABNORMAL LOW (ref 7.350–7.450)
pO2, Arterial: 191 mmHg — ABNORMAL HIGH (ref 83.0–108.0)

## 2018-11-06 LAB — LACTIC ACID, PLASMA
Lactic Acid, Venous: 1.5 mmol/L (ref 0.5–1.9)
Lactic Acid, Venous: 1.4 mmol/L (ref 0.5–1.9)

## 2018-11-06 LAB — BRAIN NATRIURETIC PEPTIDE: B Natriuretic Peptide: 271.2 pg/mL — ABNORMAL HIGH (ref 0.0–100.0)

## 2018-11-06 MED ORDER — IPRATROPIUM-ALBUTEROL 0.5-2.5 (3) MG/3ML IN SOLN
3.0000 mL | Freq: Four times a day (QID) | RESPIRATORY_TRACT | Status: DC
  Administered 2018-11-06 – 2018-11-07 (×3): 3 mL via RESPIRATORY_TRACT
  Filled 2018-11-06 (×3): qty 3

## 2018-11-06 MED ORDER — LEVOFLOXACIN IN D5W 750 MG/150ML IV SOLN: 750.0000 mg | INTRAVENOUS | Status: DC

## 2018-11-06 MED ORDER — NETARSUDIL-LATANOPROST 0.02-0.005 % OP SOLN
1.0000 [drp] | Freq: Every day | OPHTHALMIC | Status: DC
  Administered 2018-11-06: 1 [drp] via OPHTHALMIC

## 2018-11-06 MED ORDER — ENOXAPARIN SODIUM 40 MG/0.4ML ~~LOC~~ SOLN
40.0000 mg | SUBCUTANEOUS | Status: DC
  Administered 2018-11-06: 40 mg via SUBCUTANEOUS
  Filled 2018-11-06: qty 0.4

## 2018-11-06 MED ORDER — IOHEXOL 350 MG/ML SOLN
100.0000 mL | Freq: Once | INTRAVENOUS | Status: AC | PRN
  Administered 2018-11-06: 100 mL via INTRAVENOUS

## 2018-11-06 MED ORDER — METHYLPREDNISOLONE SODIUM SUCC 125 MG IJ SOLR
125.0000 mg | Freq: Once | INTRAMUSCULAR | Status: AC
  Administered 2018-11-06: 125 mg via INTRAVENOUS
  Filled 2018-11-06: qty 2

## 2018-11-06 MED ORDER — HYDROMORPHONE HCL 1 MG/ML IJ SOLN
0.5000 mg | INTRAMUSCULAR | Status: DC | PRN
  Administered 2018-11-06: 1 mg via INTRAVENOUS
  Administered 2018-11-07 (×2): 0.5 mg via INTRAVENOUS
  Filled 2018-11-06: qty 0.5
  Filled 2018-11-06 (×3): qty 1

## 2018-11-06 MED ORDER — SODIUM CHLORIDE 0.9 % IV BOLUS
500.0000 mL | Freq: Once | INTRAVENOUS | Status: AC
  Administered 2018-11-06: 500 mL via INTRAVENOUS

## 2018-11-06 MED ORDER — METHYLPREDNISOLONE SODIUM SUCC 125 MG IJ SOLR
60.0000 mg | Freq: Four times a day (QID) | INTRAMUSCULAR | Status: DC
  Administered 2018-11-06 – 2018-11-07 (×4): 60 mg via INTRAVENOUS
  Filled 2018-11-06 (×4): qty 2

## 2018-11-06 MED ORDER — ALBUTEROL SULFATE (2.5 MG/3ML) 0.083% IN NEBU: 2.5000 mg | INHALATION_SOLUTION | RESPIRATORY_TRACT | Status: DC | PRN

## 2018-11-06 MED ORDER — IPRATROPIUM-ALBUTEROL 0.5-2.5 (3) MG/3ML IN SOLN
3.0000 mL | Freq: Once | RESPIRATORY_TRACT | Status: AC
  Administered 2018-11-06: 3 mL via RESPIRATORY_TRACT
  Filled 2018-11-06: qty 3

## 2018-11-06 MED ORDER — LEVOFLOXACIN IN D5W 750 MG/150ML IV SOLN
750.0000 mg | Freq: Once | INTRAVENOUS | Status: AC
  Administered 2018-11-06: 750 mg via INTRAVENOUS
  Filled 2018-11-06: qty 150

## 2018-11-06 MED ORDER — POTASSIUM CHLORIDE IN NACL 20-0.9 MEQ/L-% IV SOLN
INTRAVENOUS | Status: DC
  Administered 2018-11-06: 16:00:00 via INTRAVENOUS
  Filled 2018-11-06 (×2): qty 1000

## 2018-11-06 MED ORDER — MORPHINE SULFATE (PF) 2 MG/ML IV SOLN: 2.0000 mg | Freq: Once | INTRAVENOUS | Status: DC

## 2018-11-06 MED ORDER — LOTEPREDNOL ETABONATE 0.5 % OP SUSP
1.0000 [drp] | Freq: Four times a day (QID) | OPHTHALMIC | Status: DC
  Administered 2018-11-06: 1 [drp] via OPHTHALMIC

## 2018-11-06 MED ORDER — LEVOFLOXACIN IN D5W 750 MG/150ML IV SOLN
750.0000 mg | INTRAVENOUS | Status: DC
  Filled 2018-11-06: qty 150

## 2018-11-06 NOTE — ED Notes (Signed)
Just arrived back from ct

## 2018-11-06 NOTE — ED Notes (Signed)
MD at bedside waiting on family to arrive to make wishes known

## 2018-11-06 NOTE — ED Notes (Signed)
Daughter still at bedside with patient - pt is on bipap still only responsive to painful stimuli.

## 2018-11-06 NOTE — ED Notes (Signed)
Gave report to christy RN charge on 2C waiting on bed to be assigned

## 2018-11-06 NOTE — ED Triage Notes (Signed)
Pt arrives via [facility] with a c/o of respiratory distress and altered mental status- pt is a chf pt who began to have respiratory distress yesterday and ems was called pt is dnr so family called hospice and decided not to transport however today family stated pt was not responsive and having worsening respiratory distress ems came back to home pt was 53% on room air - ems gave albuterol treatment and transported to hospital on NRB mask pt is currently 100% unresponsive not following any commands.

## 2018-11-06 NOTE — Progress Notes (Signed)
Pt transported to from [place] to CT and back by RT and RN without any complications.

## 2018-11-06 NOTE — H&P (Signed)
History and Physical    Amber Santiago KDX:[id number] DOB: [birth year] DOA: 11/06/2018  PCP: Simona Huh, NP  Patient coming from: Home via EMS  I have personally briefly reviewed patient's old medical records in [place]  Chief Complaint: Respiratory distress and altered mental status  HPI: Amber Santiago is a 73 y.o. 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  Patient brought in by her daughter who stated that her mother got markedly worse and always has trouble breathing but over the past couple days has developed significantly worse shortness of breath.  She states that her mother's had episodes of sats down into the 70s but this morning was in the 50s so she called 911.  She was significantly less responsive as well.  EMS has been to the house a few times in the last 24 hours but patient has improved with breathing treatments by EMS.  When patient was conscious she was refusing to go and felt that she was going to get better.  Previously patient had not been transported because she was refusing and she is on hospice care she did not want to go to the hospital.  Has had some cough at home but no known fevers.  She lives at home and has not exited the house and does not have any known exposures to [place].  She has no nausea vomiting diarrhea or constipation.  ED Course: CT scan consistent with left lower lobe pneumonia, PCO2 initially 90 6:05 hours on BiPAP it went up to greater than 97.  At the time of my evaluation patient is comatose.  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), bilateral    Arthritis    Asthma    Cancer ([place])    right lung   Constipation    COPD  (chronic obstructive pulmonary disease) (HCC)    Hypertension    Hyperthyroidism    PMH   Lung nodule    Non-traumatic compression fracture of T6 thoracic vertebra 11/28/2015   Pneumonia    Radiation 05/05/15-06/17/15   right central chest area 64 gray   Umbilical hernia     Past Surgical History:  Procedure Laterality Date   APPENDECTOMY     DILATION AND CURETTAGE OF UTERUS     IR RADIOLOGIST EVAL & MGMT  11/08/2016   IR VERTEBROPLASTY CERV/THOR BX INC UNI/BIL INC/INJECT/IMAGING  11/12/2016   IR VERTEBROPLASTY EA ADDL (T&LS) BX INC UNI/BIL INC INJECT/IMAGING  11/12/2016   MEDIASTINOSCOPY N/A 04/12/2015   Procedure: MEDIASTINOSCOPY;  Surgeon: Grace Isaac, MD;  Location: [facility];  Service: Thoracic;  Laterality: N/A;   VIDEO BRONCHOSCOPY Bilateral 03/18/2015   Procedure: VIDEO BRONCHOSCOPY WITH FLUORO;  Surgeon: Collene Gobble, MD;  Location: [facility] ENDOSCOPY;  Service: Cardiopulmonary;  Laterality: Bilateral;   VIDEO BRONCHOSCOPY WITH ENDOBRONCHIAL ULTRASOUND N/A 04/12/2015   Procedure: VIDEO BRONCHOSCOPY WITH ENDOBRONCHIAL ULTRASOUND;  Surgeon: Grace Isaac, MD;  Location: [facility] OR;  Service: Thoracic;  Laterality: N/A;    Social History   Social History Narrative   Not on file     reports that she has been smoking cigarettes. She has a 50.00 pack-year smoking history. She has never used smokeless tobacco. She reports that she does not  drink alcohol or use drugs.  Allergies  Allergen Reactions   Amoxicillin Anaphylaxis    Did it involve swelling of the face/tongue/throat, SOB, or low BP? Yes Did it involve sudden or severe rash/hives, skin peeling, or any reaction on the inside of your mouth or nose? Unknown Did you need to seek medical attention at a hospital or doctor's office? Yes When did it last happen?Not in last 10 years If all above answers are NO, may proceed with cephalosporin use.   Ativan [Lorazepam] Other (See Comments)    Caused hallucinations ,  confusion   Penicillins Anaphylaxis    Has patient had a PCN reaction causing immediate rash, facial/tongue/throat swelling, SOB or lightheadedness with hypotension: yes Has patient had a PCN reaction causing severe rash involving mucus membranes or skin necrosis: unknown Has patient had a PCN reaction that required hospitalization: office visist Has patient had a PCN reaction occurring within the last 10 years: no If all of the above answers are "NO", then may proceed with Cephalosporin use.    Prednisone Other (See Comments)    Made pt feel very spacey and out of it    Family History  Problem Relation Age of Onset   Breast cancer Maternal Grandmother      Prior to Admission medications   Medication Sig Start Date End Date Taking? Authorizing Provider  albuterol (PROAIR HFA) 108 (90 Base) MCG/ACT inhaler Inhale 2 puffs into the lungs every 6 (six) hours as needed for wheezing or shortness of breath. 04/16/16   Collene Gobble, MD  Besifloxacin HCl (BESIVANCE) 0.6 % SUSP Place 1 drop into the right eye See admin instructions. Take 1 drop in the right eye three times daily on the day of Age Macular Degeneration procedure and 1 drop four times daily the day after - AMD procedure occurs once a month 11/16/16   [provider]  budesonide-formoterol (SYMBICORT) 160-4.5 MCG/ACT inhaler INHALE TWO PUFFS INTO LUNGS TWICE DAILY Patient taking differently: Inhale 2 puffs into the lungs 2 (two) times daily.  02/08/15   Collene Gobble, MD  busPIRone (BUSPAR) 5 MG tablet Take 1 tablet (5 mg total) by mouth 2 (two) times daily. 05/30/18   Eugenie Filler, MD  dorzolamide (TRUSOPT) 2 % ophthalmic solution INSTILL 2 DROPS INTO BOTH EYES TWICE A DAY Patient taking differently: Place 1 drop into both eyes 2 (two) times daily.  10/16/16   Funches, Adriana Mccallum, MD  fluticasone (FLONASE) 50 MCG/ACT nasal spray Place 2 sprays into both nostrils daily. 05/31/18   Eugenie Filler, MD  furosemide (LASIX)  40 MG tablet Take 1 tablet (40 mg total) by mouth daily as needed. 05/30/18   Eugenie Filler, MD  guaiFENesin-dextromethorphan (ROBITUSSIN DM) 100-10 MG/5ML syrup Take 10 mLs by mouth every 4 (four) hours as needed for cough. 05/30/18   Eugenie Filler, MD  hydrOXYzine (ATARAX/VISTARIL) 10 MG tablet Take 1 tablet (10 mg total) by mouth 3 (three) times daily as needed for anxiety. 05/30/18   Eugenie Filler, MD  ipratropium-albuterol (DUONEB) 0.5-2.5 (3) MG/3ML SOLN Take 3 mLs by nebulization every 6 (six) hours as needed (shortness of breath). Use 3 times daily x5 days, then every 6 hours as needed. 05/30/18   Eugenie Filler, MD  lisinopril (PRINIVIL,ZESTRIL) 5 MG tablet Take 1 tablet (5 mg total) by mouth daily. 05/31/18   Eugenie Filler, MD  loratadine (CLARITIN) 10 MG tablet Take 1 tablet (10 mg total) by mouth daily. 05/31/18  Eugenie Filler, MD  Loteprednol Etabonate (LOTEMAX OP) Place 1 drop into the right eye 4 (four) times daily.     [provider]  mirtazapine (REMERON SOL-TAB) 15 MG disintegrating tablet Take 1 tablet (15 mg total) by mouth at bedtime. 05/30/18   Eugenie Filler, MD  morphine 10 MG/5ML solution Take 2.5 mLs (5 mg total) by mouth 2 (two) times daily as needed (air hunger or dyspnea). 05/30/18   Dellinger, Bobby Rumpf, PA-C  Multiple Vitamins-Minerals (PRESERVISION AREDS 2 PO) Take 2 capsules by mouth daily. Reported on 07/05/2015    [provider]  Netarsudil-Latanoprost (ROCKLATAN OP) Place 1 drop into both eyes at bedtime.     [provider]  pantoprazole (PROTONIX) 40 MG tablet Take 1 tablet (40 mg total) by mouth daily. 05/31/18   Eugenie Filler, MD  predniSONE (DELTASONE) 20 MG tablet Take 1-3 tablets (20-60 mg total) by mouth daily with breakfast. Take 3 tablets (60 mg) daily x3 days, then 2 tablets (40 mg) daily x3 days, then 1 tablet (20 mg) daily x3 days then stop. 05/30/18   Eugenie Filler, MD    Physical  Exam:  Constitutional: Unresponsive on BiPAP desperately ill Vitals:   11/06/18 1245 11/06/18 1330 11/06/18 1415 11/06/18 1430  BP: 138/75 134/68 136/62 130/64  Pulse: 99 96 96 99  Resp: '14 17 12 13  '$ Temp:      TempSrc:      SpO2: 100% 100% 99% 96%   Eyes: PERRL, lids and conjunctivae pale ENMT: Mucous membranes are dry; nares patent Neck: normal, supple, no masses, no thyromegaly Respiratory: Coarse to auscultation bilaterally, no wheezing, no crackles.  Markedly increased respiratory effort.  Significant accessory muscle use.  Cardiovascular: Regular rate and rhythm, no murmurs / rubs / gallops.  3+ extremity edema. 2+ pedal pulses. No carotid bruits.  Abdomen: no tenderness, no masses palpated. No hepatosplenomegaly. Bowel sounds positive.  Musculoskeletal: no clubbing / cyanosis.  Age-related arthropathy. Good ROM, no contractures.  Poor muscle tone.  Skin: no rashes, lesions, ulcers. No induration Neurologic: Does not follow commands, does not respond to verbal commands, does not respond to deep stimulation including sternal rub Psychiatric: unable   Labs on Admission: I have personally reviewed following labs and imaging studies  CBC: Recent Labs  Lab 11/06/18 0812 11/06/18 0912  WBC 14.5*  --   NEUTROABS 13.0*  --   HGB 12.0 12.6  HCT 39.2 37.0  MCV 91.8  --   PLT 302  --    Basic Metabolic Panel: Recent Labs  Lab 11/06/18 0812 11/06/18 0912  NA 137 133*  K 4.1 3.8  CL 91*  --   CO2 36*  --   GLUCOSE 118*  --   BUN 12  --   CREATININE 0.63  --   CALCIUM 8.3*  --    Urine analysis:    Component Value Date/Time   COLORURINE STRAW (A) 05/29/2018 0900   APPEARANCEUR CLEAR 05/29/2018 0900   LABSPEC 1.005 05/29/2018 0900   LABSPEC 1.005 05/17/2015 1301   PHURINE 8.0 05/29/2018 0900   GLUCOSEU NEGATIVE 05/29/2018 0900   GLUCOSEU Negative 05/17/2015 1301   HGBUR NEGATIVE 05/29/2018 0900   BILIRUBINUR NEGATIVE 05/29/2018 0900   BILIRUBINUR Negative  05/17/2015 1301   KETONESUR NEGATIVE 05/29/2018 0900   PROTEINUR NEGATIVE 05/29/2018 0900   UROBILINOGEN 0.2 05/17/2015 1301   NITRITE NEGATIVE 05/29/2018 0900   LEUKOCYTESUR NEGATIVE 05/29/2018 0900   LEUKOCYTESUR Negative 05/17/2015 1301  Radiological Exams on Admission: Ct Head Wo Contrast  Result Date: 11/06/2018 CLINICAL DATA:  Altered level of consciousness. EXAM: CT HEAD WITHOUT CONTRAST TECHNIQUE: Contiguous axial images were obtained from the base of the skull through the vertex without intravenous contrast. COMPARISON:  None. FINDINGS: Brain: No evidence of acute infarction, hemorrhage, hydrocephalus, extra-axial collection or mass lesion/mass effect. Vascular: No hyperdense vessel or unexpected calcification. Skull: Normal. Negative for fracture or focal lesion. Sinuses/Orbits: Status post cataract surgery.  Otherwise negative. Other: None. IMPRESSION: Negative head CT. Electronically Signed   By: Inge Rise M.D.   On: 11/06/2018 13:17   Ct Abdomen Pelvis W Contrast  Result Date: 11/06/2018 CLINICAL DATA:  Altered. Abdominal pain, acute and generalized. EXAM: CT ABDOMEN AND PELVIS WITH CONTRAST TECHNIQUE: Multidetector CT imaging of the abdomen and pelvis was performed using the standard protocol following bolus administration of intravenous contrast. CONTRAST:  162m OMNIPAQUE IOHEXOL 350 MG/ML SOLN COMPARISON:  CT of the chest on 03/07/2017, ultrasound of the abdomen complete on 05/15/2018, CT of the chest on 03/07/2017, 07/29/2015, and 02/15/2015 FINDINGS: Lower chest: There is new, dense consolidation LEFT LOWER lobe, associated with air bronchograms. Scattered focal tree-in-bud opacities are identified in the RIGHT LOWER lobe. Heart size is normal. Hepatobiliary: There is focal fatty infiltration adjacent to the falciform ligament. The liver is otherwise unremarkable. Gallbladder is present. Pancreas: Unremarkable. No pancreatic ductal dilatation or surrounding inflammatory  changes. Spleen: There is a small amount of fluid surrounding the spleen. Spleen is normal in size and otherwise normal in opacity. No focal splenic lesions are identified. Adrenals/Urinary Tract: Adrenal glands are normal in appearance. There are tiny subcentimeter cysts within the kidneys bilaterally. No suspicious solid mass. No hydronephrosis. Ureters are unremarkable. Foley catheter decompresses the bladder. Stomach/Bowel: The stomach is unremarkable. Small bowel loops are normal in caliber. Appendectomy. Significant stool burden throughout the colon, with large amount of stool particularly within the rectosigmoid colon. No evidence for obstructing mass. No evidence for diverticulitis. Vascular/Lymphatic: Abdominal aortic aneurysm measures 3.9 x 3.3 centimeters. There is no evidence for dissection. Significant atherosclerotic calcification. There is normal vascular opacification of the celiac axis, superior mesenteric artery, and inferior mesenteric artery. Normal appearance of the portal venous system and inferior vena cava. No retroperitoneal or mesenteric adenopathy. Reproductive: The uterus is present. There are prominent vessels in the parametrial region bilaterally, LEFT greater than RIGHT. No adnexal mass. Other: No ascites. Anterior abdominal wall is unremarkable. Musculoskeletal: No acute or significant osseous findings. IMPRESSION: 1. Dense LEFT LOWER lobe consolidation, associated with air bronchograms, suspicious for pneumonia. 2. Scattered tree-in-bud opacities in the RIGHT LOWER lobe, suspicious for infectious process. 3. Small amount of fluid surrounding the spleen, probably slightly larger when compared with prior study from 2017 and likely benign. 4. Abdominal aortic aneurysm measuring 3.9 x 3.3 cm. Recommend followup by ultrasound in 2 years. This recommendation follows ACR consensus guidelines: White Paper of the ACR Incidental Findings Committee II on Vascular Findings. J Am Coll Radiol  2013; 10:789-794. Aortic aneurysm NOS (ICD10-I71.9) 5. Prominent vessels in the parametrial region, LEFT greater than RIGHT, raising the question of pelvic congestion syndrome. 6. Foley catheter decompresses the bladder. 7. Status post appendectomy. 8. Small bilateral renal cysts. Electronically Signed   By: ENolon NationsM.D.   On: 11/06/2018 13:39   Dg Chest Port 1 View  Result Date: 11/06/2018 CLINICAL DATA:  Shortness of breath. EXAM: PORTABLE CHEST 1 VIEW COMPARISON:  Chest x-rays dated 05/29/2018 and 05/27/2018. FINDINGS: Chronic confluent opacity at  the RIGHT lung apex, not significantly changed compared to the previous exams. This was described as involving/progressive radiation change versus residual infiltrative neoplasm on chest CT of 03/07/2017, the stability in extent now suggesting benignity. Lungs are otherwise clear/stable. No pleural effusion or pneumothorax seen. Borderline cardiomegaly is stable. Overall cardiomediastinal silhouette is stable. Osseous structures about the chest are unremarkable. IMPRESSION: Stable chest x-ray. No evidence of acute cardiopulmonary abnormality. No evidence of active pneumonia or pulmonary edema. Chronic findings detailed above. Electronically Signed   By: Franki Cabot M.D.   On: 11/06/2018 08:59    EKG: Independently reviewed.  Sinus rhythm Probable left ventricular hypertrophy Unchanged when compared to February 2020    Assessment/Plan Principal Problem:   Acute on chronic respiratory failure with hypoxia and hypercapnia (HCC) Active Problems:   Comatose (HCC)   Bacterial pneumonia   COPD (chronic obstructive pulmonary disease) (HCC)   Goals of care, counseling/discussion   Tobacco use disorder   Squamous cell lung cancer ([place])   Essential hypertension   1.  Acute on chronic respiratory failure with hypoxia and hypercapnia: Patient comatose likely due to severe hypercapnia.  She is at the end of her long life and I am not sure she will  recover from this.  She is on BiPAP we will continue BiPAP and try treating her pneumonia to see if this improves.  2.  Bacterial pneumonia: We will start levofloxacin as she has allergies to other medications.  Hopefully she will respond with treatment of her left lower lobe pneumonia which appears to be dense.  3.  Severe COPD: On home hospice due to her COPD.  She has had a very rough couple of weeks and it sounds like EMS has been there a lot related to her oxygen levels.  I do believe patient is at end-of-life.  4.  Tobacco use disorder: Quit smoking a couple of years ago.  5.  History of squamous cell lung cancer: Treated in 2016.  6.  Essential hypertension: Hold home medications as blood pressures are somewhat soft.  7.  Goals of care discussion: Spoke at length with daughter and then met later with patient's husband her other daughter and spoke to her son in [place].  We are attempting to treat her overnight to see if she wakes up some however I am not sure what status will be.  They do not wish for her to be intubated but they wish for her to get treatment.  She is on hospice.  I am going to consult palliative care for further assistance.  DVT prophylaxis: Lovenox Code Status: DO NOT RESUSCITATE Family Communication: Poke at length with patient's daughter and then her other daughter husband and son who was present by phone from [place].  They understand patient may pass.  Are hopeful she will get better. Disposition Plan: Home to resume hospice care if she survives Consults called: Palliative care Admission status: Admit - It is my clinical opinion that admission to INPATIENT is reasonable and necessary because of the expectation that this patient will require hospital care that crosses at least 2 midnights to treat this condition based on the medical complexity of the problems presented.  Given the aforementioned information, the predictability of an adverse outcome is felt to be  significant.    Lady Deutscher MD FACP [facility] Pager [phone]  How to contact the [facility] Attending or Consulting provider [place] or covering provider during after hours [place], for this patient?  1. Check the care team in [facility] and look for a) attending/consulting [facility] provider listed and b) the [facility] team listed 2. Log into [email or web address] and use [facility]'s universal password to access. If you do not have the password, please contact the hospital operator. 3. Locate the [facility] provider you are looking for under [facility] and page to a number that you can be directly reached. 4. If you still have difficulty reaching the provider, please page the [facility] (Director on Call) for the Hospitalists listed on amion for assistance.  If 7PM-7AM, please contact night-coverage [email or web address] Password [email or web address]  11/06/2018, 3:00 PM

## 2018-11-06 NOTE — ED Provider Notes (Signed)
[facility] EMERGENCY DEPARTMENT Provider Note   CSN: [id number] Arrival date & time: 11/06/18  0740    History   Chief Complaint Chief Complaint  Patient presents with   Respiratory Distress   Altered Mental Status    HPI Amber Santiago is a 73 y.o. female.     Patient is a 73 year old female with a history of COPD, hypertension, AAA and prior treatment of squamous cell carcinoma of the lung who presents in respiratory distress.  She is here with her daughter who states that she always has trouble breathing but is been worse over the last couple days.  She said this morning she got markedly worse and has been less responsive.  EMS has been called out a couple times during the last 24 hours and patient has improved with some breathing treatments by EMS.  However she had not previously been transported because she is in hospice care and did not want to go the hospital.  She had a bit of a cough but no known fevers.  No known exposures to COVID.  No vomiting.       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), bilateral    Arthritis    Asthma    Cancer ([place])    right lung   Constipation    COPD (chronic obstructive pulmonary disease) (HCC)    Hypertension    Hyperthyroidism    PMH   Lung nodule    Non-traumatic compression fracture of T6 thoracic vertebra 11/28/2015   Pneumonia    Radiation 05/05/15-06/17/15   right central chest area 64 gray   Umbilical hernia     Patient Active Problem List   Diagnosis Date Noted   Acute on chronic respiratory failure with hypoxia and hypercapnia ([place]) 11/06/2018   Palliative care encounter    COPD exacerbation ([place]) 05/27/2018   Acute on chronic respiratory failure with hypoxia ([place]) 05/27/2018   Lymphedema of both lower extremities 05/27/2018   Hyponatremia 05/27/2018   Pedal edema 06/19/2016   Essential  hypertension 06/18/2016   Osteoarthritis 06/18/2016   Anxiety 06/18/2016   Non-traumatic compression fracture of T6 thoracic vertebra 11/28/2015   Fatigue 07/05/2015   Squamous cell lung cancer ([place]) 03/18/2015   COPD (chronic obstructive pulmonary disease) ([place]) 03/26/2013   Pulmonary nodules 03/26/2013   Tobacco use disorder 03/26/2013    Past Surgical History:  Procedure Laterality Date   APPENDECTOMY     DILATION AND CURETTAGE OF UTERUS     IR RADIOLOGIST EVAL & MGMT  11/08/2016   IR VERTEBROPLASTY CERV/THOR BX INC UNI/BIL INC/INJECT/IMAGING  11/12/2016   IR VERTEBROPLASTY EA ADDL (T&LS) BX INC UNI/BIL INC INJECT/IMAGING  11/12/2016   MEDIASTINOSCOPY N/A 04/12/2015   Procedure: MEDIASTINOSCOPY;  Surgeon: Grace Isaac, MD;  Location: [facility];  Service: Thoracic;  Laterality: N/A;   VIDEO BRONCHOSCOPY Bilateral 03/18/2015   Procedure: VIDEO BRONCHOSCOPY WITH FLUORO;  Surgeon: Collene Gobble, MD;  Location: [facility] ENDOSCOPY;  Service: Cardiopulmonary;  Laterality: Bilateral;   VIDEO BRONCHOSCOPY WITH ENDOBRONCHIAL ULTRASOUND N/A 04/12/2015   Procedure: VIDEO BRONCHOSCOPY WITH ENDOBRONCHIAL ULTRASOUND;  Surgeon: Grace Isaac, MD;  Location: [facility];  Service: Thoracic;  Laterality: N/A;     OB History   No obstetric history on file.      Home Medications    Prior to Admission medications   Medication Sig Start Date End Date Taking? Authorizing Provider  albuterol (  PROAIR HFA) 108 (90 Base) MCG/ACT inhaler Inhale 2 puffs into the lungs every 6 (six) hours as needed for wheezing or shortness of breath. 04/16/16   Collene Gobble, MD  Besifloxacin HCl (BESIVANCE) 0.6 % SUSP Place 1 drop into the right eye See admin instructions. Take 1 drop in the right eye three times daily on the day of Age Macular Degeneration procedure and 1 drop four times daily the day after - AMD procedure occurs once a month 11/16/16   [provider]  budesonide-formoterol (SYMBICORT) 160-4.5  MCG/ACT inhaler INHALE TWO PUFFS INTO LUNGS TWICE DAILY Patient taking differently: Inhale 2 puffs into the lungs 2 (two) times daily.  02/08/15   Collene Gobble, MD  busPIRone (BUSPAR) 5 MG tablet Take 1 tablet (5 mg total) by mouth 2 (two) times daily. 05/30/18   Eugenie Filler, MD  dorzolamide (TRUSOPT) 2 % ophthalmic solution INSTILL 2 DROPS INTO BOTH EYES TWICE A DAY Patient taking differently: Place 1 drop into both eyes 2 (two) times daily.  10/16/16   Funches, Adriana Mccallum, MD  fluticasone (FLONASE) 50 MCG/ACT nasal spray Place 2 sprays into both nostrils daily. 05/31/18   Eugenie Filler, MD  furosemide (LASIX) 40 MG tablet Take 1 tablet (40 mg total) by mouth daily as needed. 05/30/18   Eugenie Filler, MD  guaiFENesin-dextromethorphan (ROBITUSSIN DM) 100-10 MG/5ML syrup Take 10 mLs by mouth every 4 (four) hours as needed for cough. 05/30/18   Eugenie Filler, MD  hydrOXYzine (ATARAX/VISTARIL) 10 MG tablet Take 1 tablet (10 mg total) by mouth 3 (three) times daily as needed for anxiety. 05/30/18   Eugenie Filler, MD  ipratropium-albuterol (DUONEB) 0.5-2.5 (3) MG/3ML SOLN Take 3 mLs by nebulization every 6 (six) hours as needed (shortness of breath). Use 3 times daily x5 days, then every 6 hours as needed. 05/30/18   Eugenie Filler, MD  lisinopril (PRINIVIL,ZESTRIL) 5 MG tablet Take 1 tablet (5 mg total) by mouth daily. 05/31/18   Eugenie Filler, MD  loratadine (CLARITIN) 10 MG tablet Take 1 tablet (10 mg total) by mouth daily. 05/31/18   Eugenie Filler, MD  Loteprednol Etabonate (LOTEMAX OP) Place 1 drop into the right eye 4 (four) times daily.     [provider]  mirtazapine (REMERON SOL-TAB) 15 MG disintegrating tablet Take 1 tablet (15 mg total) by mouth at bedtime. 05/30/18   Eugenie Filler, MD  morphine 10 MG/5ML solution Take 2.5 mLs (5 mg total) by mouth 2 (two) times daily as needed (air hunger or dyspnea). 05/30/18   Dellinger, Bobby Rumpf, PA-C  Multiple  Vitamins-Minerals (PRESERVISION AREDS 2 PO) Take 2 capsules by mouth daily. Reported on 07/05/2015    [provider]  Netarsudil-Latanoprost (ROCKLATAN OP) Place 1 drop into both eyes at bedtime.     [provider]  pantoprazole (PROTONIX) 40 MG tablet Take 1 tablet (40 mg total) by mouth daily. 05/31/18   Eugenie Filler, MD  predniSONE (DELTASONE) 20 MG tablet Take 1-3 tablets (20-60 mg total) by mouth daily with breakfast. Take 3 tablets (60 mg) daily x3 days, then 2 tablets (40 mg) daily x3 days, then 1 tablet (20 mg) daily x3 days then stop. 05/30/18   Eugenie Filler, MD    Family History Family History  Problem Relation Age of Onset   Breast cancer Maternal Grandmother     Social History Social History   Tobacco Use   Smoking status: Current Every Day  Smoker    Packs/day: 1.00    Years: 50.00    Pack years: 50.00    Types: Cigarettes   Smokeless tobacco: Never Used  Substance Use Topics   Alcohol use: No    Alcohol/week: 0.0 standard drinks   Drug use: No     Allergies   Amoxicillin, Ativan [lorazepam], Penicillins, and Prednisone   Review of Systems Review of Systems  Unable to perform ROS: Mental status change     Physical Exam Updated Vital Signs BP 136/62    Pulse 96    Temp 97.6 F (36.4 C) (Temporal)    Resp 12    SpO2 99%   Physical Exam Constitutional:      Appearance: She is well-developed.     Comments: unresponsive  HENT:     Head: Normocephalic and atraumatic.     Mouth/Throat:     Mouth: Mucous membranes are moist.  Eyes:     Pupils: Pupils are equal, round, and reactive to light.  Neck:     Musculoskeletal: Normal range of motion and neck supple.  Cardiovascular:     Rate and Rhythm: Regular rhythm. Tachycardia present.     Heart sounds: Normal heart sounds.  Pulmonary:     Effort: Tachypnea and respiratory distress present.     Breath sounds: Wheezing and rhonchi present. No rales.  Chest:     Chest wall:  No tenderness.  Abdominal:     General: Bowel sounds are normal.     Palpations: Abdomen is soft.     Tenderness: There is abdominal tenderness (moderate diffuse tenderness). There is no guarding or rebound.  Musculoskeletal: Normal range of motion.  Lymphadenopathy:     Cervical: No cervical adenopathy.  Skin:    General: Skin is warm and dry.     Findings: No rash.  Neurological:     Comments: unresponsive      ED Treatments / Results  Labs (all labs ordered are listed, but only abnormal results are displayed) Labs Reviewed  BASIC METABOLIC PANEL - Abnormal; Notable for the following components:      Result Value   Chloride 91 (*)    CO2 36 (*)    Glucose, Bld 118 (*)    Calcium 8.3 (*)    All other components within normal limits  CBC WITH DIFFERENTIAL/PLATELET - Abnormal; Notable for the following components:   WBC 14.5 (*)    Neutro Abs 13.0 (*)    Lymphs Abs 0.6 (*)    Abs Immature Granulocytes 0.09 (*)    All other components within normal limits  BRAIN NATRIURETIC PEPTIDE - Abnormal; Notable for the following components:   B Natriuretic Peptide 271.2 (*)    All other components within normal limits  POCT I-STAT 7, (LYTES, BLD GAS, ICA,H+H) - Abnormal; Notable for the following components:   pH, Arterial 7.277 (*)    pCO2 arterial 95.4 (*)    pO2, Arterial 191.0 (*)    Bicarbonate 44.6 (*)    TCO2 47 (*)    Acid-Base Excess 14.0 (*)    Sodium 133 (*)    All other components within normal limits  SARS CORONAVIRUS 2 (HOSPITAL ORDER, [facility])  CULTURE, BLOOD (ROUTINE X 2)  CULTURE, BLOOD (ROUTINE X 2)  LACTIC ACID, PLASMA  LACTIC ACID, PLASMA  BLOOD GAS, ARTERIAL  BLOOD GAS, ARTERIAL    EKG EKG Interpretation  Date/Time:  Thursday November 06 2018 07:41:18 EDT Ventricular Rate:  99 PR  Interval:    QRS Duration: 98 QT Interval:  364 QTC Calculation: 468 R Axis:   36 Text Interpretation:  Sinus rhythm Probable left  ventricular hypertrophy since last tracing no significant change Confirmed by Malvin Johns [phone]) on 11/06/2018 9:27:08 AM   Radiology Ct Head Wo Contrast  Result Date: 11/06/2018 CLINICAL DATA:  Altered level of consciousness. EXAM: CT HEAD WITHOUT CONTRAST TECHNIQUE: Contiguous axial images were obtained from the base of the skull through the vertex without intravenous contrast. COMPARISON:  None. FINDINGS: Brain: No evidence of acute infarction, hemorrhage, hydrocephalus, extra-axial collection or mass lesion/mass effect. Vascular: No hyperdense vessel or unexpected calcification. Skull: Normal. Negative for fracture or focal lesion. Sinuses/Orbits: Status post cataract surgery.  Otherwise negative. Other: None. IMPRESSION: Negative head CT. Electronically Signed   By: Inge Rise M.D.   On: 11/06/2018 13:17   Ct Abdomen Pelvis W Contrast  Result Date: 11/06/2018 CLINICAL DATA:  Altered. Abdominal pain, acute and generalized. EXAM: CT ABDOMEN AND PELVIS WITH CONTRAST TECHNIQUE: Multidetector CT imaging of the abdomen and pelvis was performed using the standard protocol following bolus administration of intravenous contrast. CONTRAST:  137mL OMNIPAQUE IOHEXOL 350 MG/ML SOLN COMPARISON:  CT of the chest on 03/07/2017, ultrasound of the abdomen complete on 05/15/2018, CT of the chest on 03/07/2017, 07/29/2015, and 02/15/2015 FINDINGS: Lower chest: There is new, dense consolidation LEFT LOWER lobe, associated with air bronchograms. Scattered focal tree-in-bud opacities are identified in the RIGHT LOWER lobe. Heart size is normal. Hepatobiliary: There is focal fatty infiltration adjacent to the falciform ligament. The liver is otherwise unremarkable. Gallbladder is present. Pancreas: Unremarkable. No pancreatic ductal dilatation or surrounding inflammatory changes. Spleen: There is a small amount of fluid surrounding the spleen. Spleen is normal in size and otherwise normal in opacity. No focal  splenic lesions are identified. Adrenals/Urinary Tract: Adrenal glands are normal in appearance. There are tiny subcentimeter cysts within the kidneys bilaterally. No suspicious solid mass. No hydronephrosis. Ureters are unremarkable. Foley catheter decompresses the bladder. Stomach/Bowel: The stomach is unremarkable. Small bowel loops are normal in caliber. Appendectomy. Significant stool burden throughout the colon, with large amount of stool particularly within the rectosigmoid colon. No evidence for obstructing mass. No evidence for diverticulitis. Vascular/Lymphatic: Abdominal aortic aneurysm measures 3.9 x 3.3 centimeters. There is no evidence for dissection. Significant atherosclerotic calcification. There is normal vascular opacification of the celiac axis, superior mesenteric artery, and inferior mesenteric artery. Normal appearance of the portal venous system and inferior vena cava. No retroperitoneal or mesenteric adenopathy. Reproductive: The uterus is present. There are prominent vessels in the parametrial region bilaterally, LEFT greater than RIGHT. No adnexal mass. Other: No ascites. Anterior abdominal wall is unremarkable. Musculoskeletal: No acute or significant osseous findings. IMPRESSION: 1. Dense LEFT LOWER lobe consolidation, associated with air bronchograms, suspicious for pneumonia. 2. Scattered tree-in-bud opacities in the RIGHT LOWER lobe, suspicious for infectious process. 3. Small amount of fluid surrounding the spleen, probably slightly larger when compared with prior study from 2017 and likely benign. 4. Abdominal aortic aneurysm measuring 3.9 x 3.3 cm. Recommend followup by ultrasound in 2 years. This recommendation follows ACR consensus guidelines: White Paper of the ACR Incidental Findings Committee II on Vascular Findings. J Am Coll Radiol 2013; 10:789-794. Aortic aneurysm NOS (ICD10-I71.9) 5. Prominent vessels in the parametrial region, LEFT greater than RIGHT, raising the question  of pelvic congestion syndrome. 6. Foley catheter decompresses the bladder. 7. Status post appendectomy. 8. Small bilateral renal cysts. Electronically Signed   By: Benjamine Mola  Owens Shark M.D.   On: 11/06/2018 13:39   Dg Chest Port 1 View  Result Date: 11/06/2018 CLINICAL DATA:  Shortness of breath. EXAM: PORTABLE CHEST 1 VIEW COMPARISON:  Chest x-rays dated 05/29/2018 and 05/27/2018. FINDINGS: Chronic confluent opacity at the RIGHT lung apex, not significantly changed compared to the previous exams. This was described as involving/progressive radiation change versus residual infiltrative neoplasm on chest CT of 03/07/2017, the stability in extent now suggesting benignity. Lungs are otherwise clear/stable. No pleural effusion or pneumothorax seen. Borderline cardiomegaly is stable. Overall cardiomediastinal silhouette is stable. Osseous structures about the chest are unremarkable. IMPRESSION: Stable chest x-ray. No evidence of acute cardiopulmonary abnormality. No evidence of active pneumonia or pulmonary edema. Chronic findings detailed above. Electronically Signed   By: Franki Cabot M.D.   On: 11/06/2018 08:59    Procedures Procedures (including critical care time)  Medications Ordered in ED Medications  levofloxacin (LEVAQUIN) IVPB 750 mg (has no administration in time range)  ipratropium-albuterol (DUONEB) 0.5-2.5 (3) MG/3ML nebulizer solution 3 mL (3 mLs Nebulization Given by Other 11/06/18 1042)  methylPREDNISolone sodium succinate (SOLU-MEDROL) 125 mg/2 mL injection 125 mg (125 mg Intravenous Given 11/06/18 0943)  sodium chloride 0.9 % bolus 500 mL (500 mLs Intravenous New Bag/Given 11/06/18 1126)  iohexol (OMNIPAQUE) 350 MG/ML injection 100 mL (100 mLs Intravenous Contrast Given 11/06/18 1257)     Initial Impression / Assessment and Plan / ED Course  I have reviewed the triage vital signs and the nursing notes.  Pertinent labs & imaging results that were available during my care of the patient  were reviewed by me and considered in my medical decision making (see chart for details).        0800: I spoke with the daughter about goals of care.  Patient is in hospice and does have a DNR.  However the daughter does not want full comfort care measures.  Given this, we will do chest x-ray, labs and try a round of BiPAP using close circuit with nebulizer treatments.  Patient does have some tenderness in her abdomen.  I did consider the possibility of this being a ruptured AAA although the way the patient's daughter is describing the progression of the illness, it sounds much more respiratory in nature.  The daughter states that she has chronic abdominal pain and does not feel that this is much different than what she typically complains of.  At this point, I will treat her respiratory issues and reconsider a CT scan based on her progress.  Patient's chest x-ray shows no acute abnormalities.  She was treated with steroids and nebulizer treatments without significant improvement in her respiratory status.  Her PCO2 is actually worsening.  I did go ahead and do a CT of her head and abdomen/pelvis which shows evidence of pneumonia.  She was started on antibiotics.  I did discuss with the daughter that there is limited options given that she is a DNR and patient has a poor outlook.  I spoke with Dr. Evangeline Gula with the hospitalist service to admit the patient for further treatment.  CRITICAL CARE Performed by: Malvin Johns Total critical care time: 60 minutes Critical care time was exclusive of separately billable procedures and treating other patients. Critical care was necessary to treat or prevent imminent or life-threatening deterioration. Critical care was time spent personally by me on the following activities: development of treatment plan with patient and/or surrogate as well as nursing, discussions with consultants, evaluation of patient's response to treatment, examination of  patient, obtaining  history from patient or surrogate, ordering and performing treatments and interventions, ordering and review of laboratory studies, ordering and review of radiographic studies, pulse oximetry and re-evaluation of patient's condition.   Final Clinical Impressions(s) / ED Diagnoses   Final diagnoses:  Acute on chronic respiratory failure with hypercapnia ([place])  Community acquired pneumonia, unspecified laterality    ED Discharge Orders    None       Malvin Johns, MD 11/06/18 1439

## 2018-11-06 NOTE — Progress Notes (Signed)
Critical ABG results show to Dr. Tamera Punt.

## 2018-11-06 NOTE — ED Notes (Signed)
Family at bedside speaking with admitting MD

## 2018-11-06 NOTE — ED Notes (Signed)
Awaiting call from CT for a ready table.

## 2018-11-06 NOTE — ED Notes (Signed)
Family at bedside. 

## 2018-11-07 ENCOUNTER — Other Ambulatory Visit: Payer: Self-pay

## 2018-11-07 DIAGNOSIS — L8990 Pressure ulcer of unspecified site, unspecified stage: Secondary | ICD-10-CM | POA: Insufficient documentation

## 2018-11-07 DIAGNOSIS — Z720 Tobacco use: Secondary | ICD-10-CM

## 2018-11-07 DIAGNOSIS — J9622 Acute and chronic respiratory failure with hypercapnia: Secondary | ICD-10-CM | POA: Diagnosis not present

## 2018-11-07 DIAGNOSIS — J9621 Acute and chronic respiratory failure with hypoxia: Secondary | ICD-10-CM | POA: Diagnosis not present

## 2018-11-07 DIAGNOSIS — G9341 Metabolic encephalopathy: Secondary | ICD-10-CM

## 2018-11-07 DIAGNOSIS — J181 Lobar pneumonia, unspecified organism: Secondary | ICD-10-CM | POA: Diagnosis not present

## 2018-11-07 LAB — CBC
HCT: 41.4 % (ref 36.0–46.0)
Hemoglobin: 12.3 g/dL (ref 12.0–15.0)
MCH: 27.8 pg (ref 26.0–34.0)
MCHC: 29.7 g/dL — ABNORMAL LOW (ref 30.0–36.0)
MCV: 93.7 fL (ref 80.0–100.0)
Platelets: 298 10*3/uL (ref 150–400)
RBC: 4.42 MIL/uL (ref 3.87–5.11)
RDW: 15.1 % (ref 11.5–15.5)
WBC: 16.9 10*3/uL — ABNORMAL HIGH (ref 4.0–10.5)
nRBC: 0 % (ref 0.0–0.2)

## 2018-11-07 LAB — BASIC METABOLIC PANEL
Anion gap: 10 (ref 5–15)
BUN: 15 mg/dL (ref 8–23)
CO2: 34 mmol/L — ABNORMAL HIGH (ref 22–32)
Calcium: 8.5 mg/dL — ABNORMAL LOW (ref 8.9–10.3)
Chloride: 94 mmol/L — ABNORMAL LOW (ref 98–111)
Creatinine, Ser: 0.61 mg/dL (ref 0.44–1.00)
GFR calc Af Amer: >60 mL/min (ref >60)
GFR calc non Af Amer: >60 mL/min (ref >60)
Glucose, Bld: 114 mg/dL — ABNORMAL HIGH (ref 70–99)
Potassium: 4.8 mmol/L (ref 3.5–5.1)
Sodium: 138 mmol/L (ref 135–145)

## 2018-11-07 LAB — BLOOD GAS, ARTERIAL
Acid-Base Excess: 13.3 mmol/L — ABNORMAL HIGH (ref 0.0–2.0)
Bicarbonate: 40.4 mmol/L — ABNORMAL HIGH (ref 20.0–28.0)
FIO2: 0.5
O2 Saturation: 98.6 %
Patient temperature: 98.6
pCO2 arterial: 88.5 mmHg (ref 32.0–48.0)
pH, Arterial: 7.281 — ABNORMAL LOW (ref 7.350–7.450)
pO2, Arterial: 136 mmHg — ABNORMAL HIGH (ref 83.0–108.0)

## 2018-11-07 LAB — HIV ANTIBODY (ROUTINE TESTING W REFLEX): HIV Screen 4th Generation wRfx: NONREACTIVE

## 2018-11-07 MED ORDER — DORZOLAMIDE HCL 2 % OP SOLN: 1.0000 [drp] | Freq: Two times a day (BID) | OPHTHALMIC | Status: AC

## 2018-11-07 MED ORDER — DEXAMETHASONE 4 MG PO TABS: 4.0000 mg | ORAL_TABLET | Freq: Every day | ORAL | Status: AC

## 2018-11-07 MED ORDER — BUSPIRONE HCL 5 MG PO TABS: 5.0000 mg | ORAL_TABLET | Freq: Two times a day (BID) | ORAL | Status: AC | PRN

## 2018-11-07 MED ORDER — LISINOPRIL 5 MG PO TABS: 10.0000 mg | ORAL_TABLET | ORAL | Status: AC

## 2018-11-07 MED ORDER — OXYBUTYNIN CHLORIDE 5 MG PO TABS: 5.0000 mg | ORAL_TABLET | Freq: Three times a day (TID) | ORAL | Status: AC

## 2018-11-07 MED ORDER — SENNA-DOCUSATE SODIUM 8.6-50 MG PO TABS: 2.0000 | ORAL_TABLET | Freq: Two times a day (BID) | ORAL | Status: AC

## 2018-11-07 MED ORDER — LIDOCAINE HCL URETHRAL/MUCOSAL 2 % EX GEL: 1.0000 "application " | CUTANEOUS | Status: AC | PRN

## 2018-11-07 MED ORDER — FUROSEMIDE 10 MG/ML IJ SOLN: 40.0000 mg | Freq: Once | INTRAMUSCULAR | Status: DC

## 2018-11-07 NOTE — Care Management Obs Status (Signed)
[facility] NOTIFICATION   Patient Details  Name: Amber Santiago MRN: [id number] Date of Birth: [birth year]   Medicare Observation Status Notification Given:  Yes    Zenon Mayo, RN 11/07/2018, 2:08 PM

## 2018-11-07 NOTE — Progress Notes (Signed)
[facility]:  This is an active current pt with [facility]. She has been declining rapidly at home for hte past several days prior to hospitalization.  Discussion with daughter Jonelle Sidle yesterday reporting that if pt doesn't become more stable she may not be able to transfer back home.  Reach out to the Chaplain department with [facility] to come to bedside and support the daughters due the pt today continues to be unresponsive and after talking with RN on floor expect hospital death. [name] has been working with our bereavement dept for a couple months now to help prepare her for the mothers change in condition and we have asked that our bereavement counselor-Kathy to reach out to her since they have a relationship. I have called daughterJonelle Sidle today with message left to return call so we can support her. SW will also reach out to the family to offer support as well with Hospice. Webb Silversmith RN [phone]

## 2018-11-07 NOTE — Plan of Care (Signed)
  Problem: Education: Goal: Knowledge of General Education information will improve Description: Including pain rating scale, medication(s)/side effects and non-pharmacologic comfort measures Outcome: Progressing   Problem: Clinical Measurements: Goal: Ability to maintain clinical measurements within normal limits will improve Outcome: Progressing   

## 2018-11-07 NOTE — Progress Notes (Signed)
[facility]:   TC from [place] at [facility]. She has called to update me and let me know the pt is going to be discharged at request of the daughters and they are demanding she go home now. The pt has been getting dilaudid 1 mg IV and has haloperidol ordered at hospital if needed. We have both in home by mouth that family was using prior to the hospitalization. She has been weaned to 5 liters of oxygen and has a 5 liter concentrator in home. I called and spoke to [name] the daughter who was speaking to other family in room at hospital. I had the conversation with her about her mother could possibly pass on the ambulance on her way home. She verbalized understanding. I have also educated her that if this happen by law her mother would have to be returned to the hospital that they can not deliver a deceased body to a home. [name] continues to unrealistic saying her mother will make it home and they are going to have several more months together and she is tearful. I politely spoke to her to say I hope so but I can not make that promise to you and she should prepare for her to pass peacefully hopefully at home.  Webb Silversmith [phone]

## 2018-11-07 NOTE — Progress Notes (Signed)
Critical ABG results called to [facility].

## 2018-11-07 NOTE — Progress Notes (Signed)
   11/07/18 1500  Clinical Encounter Type  Visited With Health care provider;Family;Patient not available  Visit Type Initial;Spiritual support;Psychological support;Social support;Patient actively dying  Referral From Other (Comment) ([place])  Consult/Referral To Other (Comment) ([facility])  Spiritual Encounters  Spiritual Needs Grief support;Emotional  Stress Factors  Patient Stress Factors Not reviewed  Family Stress Factors Major life changes;Loss of control;Exhausted;Family relationships;Financial concerns   Spoke w/ care RN before and after meeting w/ daughter [name] at bedside.  Told pt that I was present, but no response hear/observed.  Pt talked through last 3 years with health challenges of her parents, who live w/ her and she cares for them.  Pt is not married, sister is married and lives in the area, while their brother lives in [place].  Per [name], her brother has been to see parents 3 xs this year, although the financial burden for multiple transatlantic flights is considerable.  Brother (pt's son) is attempting to make arrangements to visit now, but encountering difficulties d/t Covid restrictions on travel between countries.  Pt acknowledged the many medical dx pt has, the "rock and a hard place" difficulties pt faces d/t addressing one problem and cause/effect with other problems.  Pt acknowledged that she has read the blue booklet that talks about what a person may notice when someone they love is nearing her last days.  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  Challenges of AMD for that previously loved vocation.  Chaplain paged away to two deaths, ck'd in w/ [name] and care RN.  Feel free to page back.  Myra Gianotti resident, [phone]

## 2018-11-07 NOTE — Progress Notes (Signed)
Spoke w/Dr. Hilma Favors, MD, Palliative care re: patient status.  She states Hospice will and should be following this patient's plan of care.  Dr. Hilma Favors states she will follow up with Hospice and ensure they speak w/family today.  Dr. Algis Liming had a long discussion with daughter about patient's grave condition.  The plan now is for patient to continue to receive BiPAP at current settings, IV ATBs, and IV steroids as ordered.  Will recheck ABGs around noon today.  Anticipate hospice to either call or come see patient later today as well.    Daughter is requesting a letter for brother to use at the [place] [employer] for travel to the [place].

## 2018-11-07 NOTE — Care Management CC44 (Signed)
Condition Code 44 Documentation Completed  Patient Details  Name: Amber Santiago MRN: [id number] Date of Birth: 1945/09/15   Condition Code 44 given:  Yes Patient signature on Condition Code 44 notice:  Yes Documentation of 2 MD's agreement:  Yes Code 44 added to claim:  Yes    Zenon Mayo, RN 11/07/2018, 2:08 PM

## 2018-11-07 NOTE — Progress Notes (Signed)
Voicemail left for Webb Silversmith w/Hospice re: family wishes and patient wishes to go home.  Dr. Algis Liming has placed discharge orders.  Tomi Bamberger, CSW notified and will arrange transport home as well as contact for Hospice care to continue support at home.

## 2018-11-07 NOTE — Progress Notes (Signed)
PROGRESS NOTE   Evalise Abruzzese  LOV:[id number]    DOB: [birth year]    DOA: 11/06/2018  PCP: Simona Huh, NP   I have briefly reviewed patients previous medical records in [facility].  Chief Complaint  Patient presents with   Respiratory Distress   Altered Mental Status    Brief Narrative:  73 year old female on home hospice, lives with daughter, mostly sedentary, with PMH of severe COPD, chronic respiratory failure with hypoxia on home oxygen 2 L/min continuously, ongoing tobacco abuse, HTN, abdominal aortic aneurysm, prior treatment of squamous cell lung cancer in 2016, presented to the ED 11/06/2018 due to progressive worsening dyspnea, hypoxia, unresponsiveness.  Admitted for acute on chronic respiratory failure with hypoxia and hypercarbia, suspected pneumonia, complicating underlying COPD.  After extensive discussion with family, placed on BiPAP, not much improvement.  Overall poor prognosis.   Assessment & Plan:   Principal Problem:   Acute on chronic respiratory failure with hypoxia and hypercapnia (HCC) Active Problems:   COPD (chronic obstructive pulmonary disease) (HCC)   Tobacco use disorder   Squamous cell lung cancer (HCC)   Essential hypertension   Goals of care, counseling/discussion   Comatose ([place])   Bacterial pneumonia   Pressure injury of skin   Acute on chronic respiratory failure with hypoxia and hypercarbia  Secondary to COPD exacerbation and lobar pneumonia noted on CT chest.  At baseline patient has chronic dyspnea which worsened over the last several days PTA.  While coherent, patient refused to come to the hospital.  DNR confirmed with family.  They wished to treat with hope for recovery and BiPAP started.  ABG on admission: pH 7.277, PCO2 95.4, PO2 191, bicarbonate 45 and oxygen saturation 99%.  Treating with BiPAP, IV Solu-Medrol, IV levofloxacin, bronchodilator nebulizations.  Repeat ABG almost after 24 hours with minimal improvement: pH  7.281, PCO2 89, PO2 136, bicarbonate 40 and oxygen saturation 98.6.  Long discussion with daughter/healthcare power of attorney at bedside along with RN and RT.  Advised her of overall poor prognosis with minimal recovery since admission.  Daughter advised to reconsider further course i.e. transition to comfort care versus continued current management.  As discussed with RT, currently on maximum BiPAP settings.  Repeat ABG in 6 hours.  Patient appears volume overloaded, will give a dose of IV Lasix 40 mg x 1  Acute metabolic encephalopathy/comatose  Secondary to marked hypercapnia and hypoxia.  CT head without acute findings.  Treat underlying cause as above and monitor.  As per daughter, patient is a little more responsive and communicative compared to admission.  Lobar pneumonia (right lower lobe and possibly left lower lobe)  Could be community-acquired versus aspiration related to mental status changes.  Continue IV levofloxacin.  NPO  COPD exacerbation  Continue oxygen support as above, IV antibiotics, IV steroids and bronchodilator nebulizations.  Tobacco abuse  Patient reportedly still smoking half to 1 pack cigarettes per day.  Hold off on nicotine patch until patient alert.  Essential hypertension  Antihypertensives held on admission due to soft blood pressures.  Monitor off of antihypertensives.  History of squamous cell lung cancer  Treated in 2016.  Noted.  Abdominal aortic aneurysm  Noted on CT chest.  Given her overall poor prognosis, no need to follow-up.  Adult failure to thrive Secondary to advanced age, frail physical health and multiple severe significant medical problems as listed above.  Overall poor prognosis has been conveyed to patient's daughter at length and she verbalizes understanding.  Also advised  her that patient may not make it alive through current hospitalization.  Severe malnutrition  Secondary to advanced age and severe medical  problems listed above. Estimated body mass index is 22.86 kg/m as calculated from the following:   Height as of this encounter: 5\' 7"  (1.702 m).   Weight as of this encounter: 66.2 kg.          DVT prophylaxis: Lovenox Code Status: DNR Family Communication: Discussed in detail with patient's daughter/healthcare power of attorney at bedside Disposition: To be determined pending further hospital course, patient and family decision.   Consultants:  Hospice  Procedures:  BiPAP  Antimicrobials:  IV levofloxacin   Subjective: Evaluated patient with patient's daughter, RN and RT at bedside.  Patient mostly unresponsive, not to call briefly opens eyes mumbles incomprehensibly and drifts back to sleep.  As per daughter, patient more alert than on admission, at times answers order to coherently and dyspnea has improved.  Objective:  Vitals:   11/06/18 2313 11/07/18 0110 11/07/18 0825 11/07/18 1150  BP: 139/80     Pulse: (!) 108  (!) 104 (!) 105  Resp: 15 17 18  (!) 21  Temp:      TempSrc:      SpO2: 100%  98% 98%  Weight:      Height:        Examination:  General exam: Elderly female, moderately built, frail, poorly nourished, chronically ill looking lying propped up in bed with mild respiratory distress. Respiratory system: Reduced breath sounds bilaterally with scattered few expiratory rhonchi and basal crackles.  On BiPAP.  Mild increased work of breathing. Cardiovascular system: S1 & S2 heard, RRR. No JVD, murmurs, rubs, gallops or clicks.  Chronic bilateral leg edema with skin changes.  Edema extends up to the thighs. Gastrointestinal system: Abdomen is nondistended, soft and nontender. No organomegaly or masses felt. Normal bowel sounds heard. Central nervous system: Mental status as noted above. No focal neurological deficits. Extremities: Edema as discussed above.  Spontaneously moves all extremities. Skin: No rashes, lesions or ulcers Psychiatry: Judgement and  insight impaired. Mood & affect cannot be assessed.     Data Reviewed: I have personally reviewed following labs and imaging studies  CBC: Recent Labs  Lab 11/06/18 0812 11/06/18 0912 11/07/18 0504  WBC 14.5*  --  16.9*  NEUTROABS 13.0*  --   --   HGB 12.0 12.6 12.3  HCT 39.2 37.0 41.4  MCV 91.8  --  93.7  PLT 302  --  726   Basic Metabolic Panel: Recent Labs  Lab 11/06/18 0812 11/06/18 0912 11/07/18 0504  NA 137 133* 138  K 4.1 3.8 4.8  CL 91*  --  94*  CO2 36*  --  34*  GLUCOSE 118*  --  114*  BUN 12  --  15  CREATININE 0.63  --  0.61  CALCIUM 8.3*  --  8.5*   Liver Function Tests: No results for input(s): AST, ALT, ALKPHOS, BILITOT, PROT, ALBUMIN in the last 168 hours.  Cardiac Enzymes: No results for input(s): CKTOTAL, CKMB, CKMBINDEX, TROPONINI in the last 168 hours.  CBG: No results for input(s): GLUCAP in the last 168 hours.  Recent Results (from the past 240 hour(s))  SARS Coronavirus 2 (CEPHEID - Performed in [place] hospital lab), Hosp Order     Status: None   Collection Time: 11/06/18  8:10 AM   Specimen: Nasopharyngeal Swab  Result Value Ref Range Status   SARS Coronavirus 2 NEGATIVE NEGATIVE Final  Comment: (NOTE) If result is NEGATIVE SARS-CoV-2 target nucleic acids are NOT DETECTED. The SARS-CoV-2 RNA is generally detectable in upper and lower  respiratory specimens during the acute phase of infection. The lowest  concentration of SARS-CoV-2 viral copies this assay can detect is 250  copies / mL. A negative result does not preclude SARS-CoV-2 infection  and should not be used as the sole basis for treatment or other  patient management decisions.  A negative result may occur with  improper specimen collection / handling, submission of specimen other  than nasopharyngeal swab, presence of viral mutation(s) within the  areas targeted by this assay, and inadequate number of viral copies  (<250 copies / mL). A negative result must be  combined with clinical  observations, patient history, and epidemiological information. If result is POSITIVE SARS-CoV-2 target nucleic acids are DETECTED. The SARS-CoV-2 RNA is generally detectable in upper and lower  respiratory specimens dur ing the acute phase of infection.  Positive  results are indicative of active infection with SARS-CoV-2.  Clinical  correlation with patient history and other diagnostic information is  necessary to determine patient infection status.  Positive results do  not rule out bacterial infection or co-infection with other viruses. If result is PRESUMPTIVE POSTIVE SARS-CoV-2 nucleic acids MAY BE PRESENT.   A presumptive positive result was obtained on the submitted specimen  and confirmed on repeat testing.  While 2019 novel coronavirus  (SARS-CoV-2) nucleic acids may be present in the submitted sample  additional confirmatory testing may be necessary for epidemiological  and / or clinical management purposes  to differentiate between  SARS-CoV-2 and other Sarbecovirus currently known to infect humans.  If clinically indicated additional testing with an alternate test  methodology [phone]) is advised. The SARS-CoV-2 RNA is generally  detectable in upper and lower respiratory sp ecimens during the acute  phase of infection. The expected result is Negative. Fact Sheet for Patients:  StrictlyIdeas.no Fact Sheet for Healthcare Providers: BankingDealers.co.za This test is not yet approved or cleared by the [place] FDA and has been authorized for detection and/or diagnosis of SARS-CoV-2 by FDA under an Emergency Use Authorization (EUA).  This EUA will remain in effect (meaning this test can be used) for the duration of the COVID-19 declaration under Section 564(b)(1) of the Act, 21 U.S.C. section 360bbb-3(b)(1), unless the authorization is terminated or revoked sooner. Performed at [facility], [place] [address] [place], [place] [address]          Radiology Studies: Ct Head Wo Contrast  Result Date: 11/06/2018 CLINICAL DATA:  Altered level of consciousness. EXAM: CT HEAD WITHOUT CONTRAST TECHNIQUE: Contiguous axial images were obtained from the base of the skull through the vertex without intravenous contrast. COMPARISON:  None. FINDINGS: Brain: No evidence of acute infarction, hemorrhage, hydrocephalus, extra-axial collection or mass lesion/mass effect. Vascular: No hyperdense vessel or unexpected calcification. Skull: Normal. Negative for fracture or focal lesion. Sinuses/Orbits: Status post cataract surgery.  Otherwise negative. Other: None. IMPRESSION: Negative head CT. Electronically Signed   By: Inge Rise M.D.   On: 11/06/2018 13:17   Ct Abdomen Pelvis W Contrast  Result Date: 11/06/2018 CLINICAL DATA:  Altered. Abdominal pain, acute and generalized. EXAM: CT ABDOMEN AND PELVIS WITH CONTRAST TECHNIQUE: Multidetector CT imaging of the abdomen and pelvis was performed using the standard protocol following bolus administration of intravenous contrast. CONTRAST:  110mL OMNIPAQUE IOHEXOL 350 MG/ML SOLN COMPARISON:  CT of the chest on 03/07/2017, ultrasound of the abdomen  complete on 05/15/2018, CT of the chest on 03/07/2017, 07/29/2015, and 02/15/2015 FINDINGS: Lower chest: There is new, dense consolidation LEFT LOWER lobe, associated with air bronchograms. Scattered focal tree-in-bud opacities are identified in the RIGHT LOWER lobe. Heart size is normal. Hepatobiliary: There is focal fatty infiltration adjacent to the falciform ligament. The liver is otherwise unremarkable. Gallbladder is present. Pancreas: Unremarkable. No pancreatic ductal dilatation or surrounding inflammatory changes. Spleen: There is a small amount of fluid surrounding the spleen. Spleen is normal in size and otherwise normal in opacity. No focal splenic lesions are identified. Adrenals/Urinary Tract: Adrenal  glands are normal in appearance. There are tiny subcentimeter cysts within the kidneys bilaterally. No suspicious solid mass. No hydronephrosis. Ureters are unremarkable. Foley catheter decompresses the bladder. Stomach/Bowel: The stomach is unremarkable. Small bowel loops are normal in caliber. Appendectomy. Significant stool burden throughout the colon, with large amount of stool particularly within the rectosigmoid colon. No evidence for obstructing mass. No evidence for diverticulitis. Vascular/Lymphatic: Abdominal aortic aneurysm measures 3.9 x 3.3 centimeters. There is no evidence for dissection. Significant atherosclerotic calcification. There is normal vascular opacification of the celiac axis, superior mesenteric artery, and inferior mesenteric artery. Normal appearance of the portal venous system and inferior vena cava. No retroperitoneal or mesenteric adenopathy. Reproductive: The uterus is present. There are prominent vessels in the parametrial region bilaterally, LEFT greater than RIGHT. No adnexal mass. Other: No ascites. Anterior abdominal wall is unremarkable. Musculoskeletal: No acute or significant osseous findings. IMPRESSION: 1. Dense LEFT LOWER lobe consolidation, associated with air bronchograms, suspicious for pneumonia. 2. Scattered tree-in-bud opacities in the RIGHT LOWER lobe, suspicious for infectious process. 3. Small amount of fluid surrounding the spleen, probably slightly larger when compared with prior study from 2017 and likely benign. 4. Abdominal aortic aneurysm measuring 3.9 x 3.3 cm. Recommend followup by ultrasound in 2 years. This recommendation follows ACR consensus guidelines: White Paper of the ACR Incidental Findings Committee II on Vascular Findings. J Am Coll Radiol 2013; 10:789-794. Aortic aneurysm NOS (ICD10-I71.9) 5. Prominent vessels in the parametrial region, LEFT greater than RIGHT, raising the question of pelvic congestion syndrome. 6. Foley catheter decompresses  the bladder. 7. Status post appendectomy. 8. Small bilateral renal cysts. Electronically Signed   By: Nolon Nations M.D.   On: 11/06/2018 13:39   Dg Chest Port 1 View  Result Date: 11/06/2018 CLINICAL DATA:  Shortness of breath. EXAM: PORTABLE CHEST 1 VIEW COMPARISON:  Chest x-rays dated 05/29/2018 and 05/27/2018. FINDINGS: Chronic confluent opacity at the RIGHT lung apex, not significantly changed compared to the previous exams. This was described as involving/progressive radiation change versus residual infiltrative neoplasm on chest CT of 03/07/2017, the stability in extent now suggesting benignity. Lungs are otherwise clear/stable. No pleural effusion or pneumothorax seen. Borderline cardiomegaly is stable. Overall cardiomediastinal silhouette is stable. Osseous structures about the chest are unremarkable. IMPRESSION: Stable chest x-ray. No evidence of acute cardiopulmonary abnormality. No evidence of active pneumonia or pulmonary edema. Chronic findings detailed above. Electronically Signed   By: Franki Cabot M.D.   On: 11/06/2018 08:59        Scheduled Meds:  enoxaparin (LOVENOX) injection  40 mg Subcutaneous Q24H   ipratropium-albuterol  3 mL Nebulization Q6H   loteprednol  1 drop Right Eye QID   methylPREDNISolone (SOLU-MEDROL) injection  60 mg Intravenous Q6H   Netarsudil-Latanoprost  1 drop Both Eyes QHS   Continuous Infusions:  0.9 % NaCl with KCl 20 mEq / L 75 mL/hr at 11/06/18 1623  levofloxacin (LEVAQUIN) IV       LOS: 1 day     Vernell Leep, MD, FACP, [facility]. [facility]  To contact the attending provider between 7A-7P or the covering provider during after hours 7P-7A, please log into the web site [email or web address] and access using universal [place] password for that web site. If you do not have the password, please call the hospital operator.  11/07/2018, 12:25 PM

## 2018-11-07 NOTE — Plan of Care (Signed)
  Problem: Education: Goal: Knowledge of General Education information will improve Description: Including pain rating scale, medication(s)/side effects and non-pharmacologic comfort measures Outcome: Adequate for Discharge   

## 2018-11-07 NOTE — Progress Notes (Signed)
Patient and multiple family members (husband, two daughters, and son-in-law are present at bedside and son, Nicki Reaper, is on facetime.  All family and patient requests patient be removed from BiPAP.  Placed on O2 @ 6L/[place].  Patient makes her wishes known stating she wants to go home. Family is in agreement.  Dr. Algis Liming notified and confirms w/family.  Treatment team will attempt to get discharge lined up w/Hospice following for comfort care only at home.

## 2018-11-07 NOTE — Discharge Summary (Addendum)
Physician Discharge Summary  Daquisha Clermont OXB:[id number] DOB: March 06, 1946  PCP: Simona Huh, NP  Admitted from: Home with hospice Discharged to: Home with hospice  Admit date: 11/06/2018 Discharge date: 11/07/2018  Recommendations for Outpatient Follow-up:  1. [place]: To follow-up for end-of-life care. 2. Simona Huh, NP/PCP as needed.  Follow-up Information    Simona Huh, NP. Schedule an appointment as soon as possible for a visit.   Specialty: Nurse Practitioner Contact information: [place] [address] [place]: Follow up.   Why: Hospice to continue to follow for end-of-life care.           Home Health: None Equipment/Devices: None  Discharge Condition: [place] prognosis CODE STATUS: DNR Diet recommendation: Heart healthy diet  Discharge Diagnoses:  Principal Problem:   Acute on chronic respiratory failure with hypoxia and hypercapnia (HCC) Active Problems:   COPD (chronic obstructive pulmonary disease) (HCC)   Tobacco use disorder   Squamous cell lung cancer ([place])   Essential hypertension   Goals of care, counseling/discussion   Comatose ([place])   Bacterial pneumonia   Pressure injury of skin   Brief Summary: 73 year old female on home hospice, lives with daughter, mostly sedentary, with PMH of severe COPD, chronic respiratory failure with hypoxia on home oxygen 2 L/min continuously, ongoing tobacco abuse, HTN, abdominal aortic aneurysm, prior treatment of squamous cell lung cancer in 2016, presented to the ED 11/06/2018 due to progressive worsening dyspnea, hypoxia, unresponsiveness.  Admitted for acute on chronic respiratory failure with hypoxia and hypercarbia, suspected pneumonia, complicating underlying COPD.  After extensive discussion with family, placed on BiPAP, not much improvement.  Overall poor prognosis.   Assessment & Plan:  Acute on chronic respiratory failure with  hypoxia and hypercarbia  Secondary to COPD exacerbation and lobar pneumonia noted on CT chest.  At baseline patient has chronic dyspnea which worsened over the last several days PTA.  While coherent, patient refused to come to the hospital.  DNR confirmed with family.  They wished to treat with hope for recovery and BiPAP started.  ABG on admission: pH 7.277, PCO2 95.4, PO2 191, bicarbonate 45 and oxygen saturation 99%.  Treating with BiPAP, IV Solu-Medrol, IV levofloxacin, bronchodilator nebulizations.  Repeat ABG almost after 24 hours with minimal improvement: pH 7.281, PCO2 89, PO2 136, bicarbonate 40 and oxygen saturation 98.6.  Long discussion with daughter/healthcare power of attorney at bedside along with RN and RT.  Advised her of overall poor prognosis with minimal recovery since admission.  Daughter advised to reconsider further course i.e. transition to comfort care versus continued current management.  Multiple family members arrived at patient's bedside.  Early this afternoon, patient became more responsive, alert, wanted BiPAP removed.  Family too wanted patient's BiPAP removed.  Patient verbalized to her family that she wanted to go home.  She appeared to be in some respiratory distress, hypoxic down to the 60s-70s on 8 L/min nasal cannula oxygen but after a dose of Dilaudid, significant improvement, no further respiratory distress, oxygen saturations in the low 90s.  After extensive discussion amongst themselves, family finally decided that they wish to take patient home with hospice.  I discussed in detail with the daughter/healthcare power of attorney and advised her that it is possible that patient may even demise on route from the hospital to home and she verbalized understanding.  Daughter also agreeable that patient will return home on prior comfort medications.  Discussed at  length with nursing who will update patient's home hospice services regarding discharge.  Acute  metabolic encephalopathy/comatose  Secondary to marked hypercapnia and hypoxia.  CT head without acute findings.  Mental status somewhat improved after above treatment.  As per daughter, patient is a little more responsive and communicative compared to admission.  Lobar pneumonia (right lower lobe and possibly left lower lobe)  Could be community-acquired versus aspiration related to mental status changes.  Treated in the hospital with IV levofloxacin which will not be continued at discharge due to full comfort care and imminent death as discussed in detail with family.  COPD exacerbation  Treated with oxygen support as above, IV antibiotics, IV steroids and bronchodilator nebulizations.  Tobacco abuse  Patient reportedly still smoking half to 1 pack cigarettes per day.  Hold off on nicotine patch until patient alert.  Essential hypertension  Antihypertensives held on admission due to soft blood pressures.    History of squamous cell lung cancer  Treated in 2016.  Noted.  Abdominal aortic aneurysm  Noted on CT chest.  Given her overall poor prognosis, no need to follow-up.  Adult failure to thrive Secondary to advanced age, frail physical health and multiple severe significant medical problems as listed above.  Overall poor prognosis has been conveyed to patient's daughter at length and she verbalizes understanding.  Also advised her that patient may not make it alive through current hospitalization.  Severe malnutrition  Secondary to advanced age and severe medical problems listed above. Estimated body mass index is 22.86 kg/m as calculated from the following:   Height as of this encounter: 5\' 7"  (1.702 m).   Weight as of this encounter: 66.2 kg.       Consultants:  Hospice  Procedures:  BiPAP Foley catheter   Discharge Instructions  Discharge Instructions    Call MD for:  difficulty breathing, headache or visual disturbances   Complete by: As  directed    Call MD for:  extreme fatigue   Complete by: As directed    Call MD for:  persistant dizziness or light-headedness   Complete by: As directed    Call MD for:  persistant nausea and vomiting   Complete by: As directed    Call MD for:  severe uncontrolled pain   Complete by: As directed    Call MD for:  temperature >100.4   Complete by: As directed    Diet - low sodium heart healthy   Complete by: As directed    Increase activity slowly   Complete by: As directed        Medication List    STOP taking these medications   furosemide 40 MG tablet Commonly known as: LASIX   hydrOXYzine 10 MG tablet Commonly known as: ATARAX/VISTARIL   mirtazapine 15 MG disintegrating tablet Commonly known as: REMERON SOL-TAB   morphine 10 MG/5ML solution   predniSONE 20 MG tablet Commonly known as: DELTASONE     TAKE these medications   albuterol 108 (90 Base) MCG/ACT inhaler Commonly known as: ProAir HFA Inhale 2 puffs into the lungs every 6 (six) hours as needed for wheezing or shortness of breath.   ALPRAZolam 0.25 MG tablet Commonly known as: XANAX Take 0.5-1 mg by mouth every 4 (four) hours as needed (for anxiety or dyspnea not relieved by Hydromorphone).   Besivance 0.6 % Susp Generic drug: Besifloxacin HCl Place 1 drop into the right eye See admin instructions. Take 1 drop in the right eye three times daily on  the day of Age Macular Degeneration procedure and 1 drop four times daily the day after - AMD procedure occurs once a month   budesonide-formoterol 160-4.5 MCG/ACT inhaler Commonly known as: Symbicort INHALE TWO PUFFS INTO LUNGS TWICE DAILY What changed:   how much to take  how to take this  when to take this  additional instructions   busPIRone 5 MG tablet Commonly known as: BUSPAR Take 1 tablet (5 mg total) by mouth 2 (two) times daily as needed (for anxiety).   dexamethasone 4 MG tablet Commonly known as: DECADRON Take 1 tablet (4 mg total) by  mouth daily.   dorzolamide 2 % ophthalmic solution Commonly known as: TRUSOPT Place 1 drop into both eyes 2 (two) times daily. What changed: See the new instructions.   fluticasone 50 MCG/ACT nasal spray Commonly known as: FLONASE Place 2 sprays into both nostrils daily.   guaiFENesin-dextromethorphan 100-10 MG/5ML syrup Commonly known as: ROBITUSSIN DM Take 10 mLs by mouth every 4 (four) hours as needed for cough.   haloperidol 1 MG tablet Commonly known as: HALDOL Take 1 mg by mouth every 4 (four) hours as needed (for nausea, agitation, or delirium).   HYDROmorphone 2 MG tablet Commonly known as: DILAUDID Take 2-4 mg by mouth every 2 (two) hours as needed (for pain or shortness of breath).   ipratropium-albuterol 0.5-2.5 (3) MG/3ML Soln Commonly known as: DUONEB Take 3 mLs by nebulization every 6 (six) hours as needed (shortness of breath). Use 3 times daily x5 days, then every 6 hours as needed. What changed:   reasons to take this  additional instructions   lidocaine 2 % jelly Commonly known as: XYLOCAINE Apply 1 application topically as needed for up to 6 days (to painful sites).   lisinopril 5 MG tablet Commonly known as: ZESTRIL Take 2 tablets (10 mg total) by mouth See admin instructions. Take 10 mg by mouth once a day for a Systolic reading >938   loratadine 10 MG tablet Commonly known as: CLARITIN Take 1 tablet (10 mg total) by mouth daily.   LOTEMAX OP Place 1 drop into the right eye 4 (four) times daily.   oxybutynin 5 MG tablet Commonly known as: DITROPAN Take 1 tablet (5 mg total) by mouth 3 (three) times daily for 20 days.   OXYGEN Inhale 2-6 L into the lungs as needed (AND TITRATE FOR COMFORT).   pantoprazole 40 MG tablet Commonly known as: PROTONIX Take 1 tablet (40 mg total) by mouth daily.   PRESERVISION AREDS 2 PO Take 2 capsules by mouth daily. Reported on 07/05/2015   ROCKLATAN OP Place 1 drop into both eyes at bedtime.    sennosides-docusate sodium 8.6-50 MG tablet Commonly known as: SENOKOT-S Take 2-4 tablets by mouth 2 (two) times a day for 20 days.   torsemide 10 MG tablet Commonly known as: DEMADEX Take 10 mg by mouth 2 (two) times daily.   triamcinolone cream 0.1 % Commonly known as: KENALOG Apply 1 application topically daily as needed (for skin irritation or rashes).   TYLENOL 500 MG tablet Generic drug: acetaminophen Take 250 mg by mouth at bedtime.      Allergies  Allergen Reactions  . Amoxicillin Anaphylaxis    Did it involve swelling of the face/tongue/throat, SOB, or low BP? Yes Did it involve sudden or severe rash/hives, skin peeling, or any reaction on the inside of your mouth or nose? Unknown Did you need to seek medical attention at a hospital or doctor's office? Yes  When did it last happen?Over 10 years ago If all above answers are "NO", may proceed with cephalosporin use.  . Ativan [Lorazepam] Other (See Comments)    Caused hallucinations , confusion  . Penicillins Anaphylaxis    Has patient had a PCN reaction causing immediate rash, facial/tongue/throat swelling, SOB or lightheadedness with hypotension: yes Has patient had a PCN reaction causing severe rash involving mucus membranes or skin necrosis: unknown Has patient had a PCN reaction that required hospitalization: office visist Has patient had a PCN reaction occurring within the last 10 years: no If all of the above answers are "NO", then may proceed with Cephalosporin use.   . Prednisone Other (See Comments)    Made the patient feel "very spacey and out of it"  . Morphine And Related Itching    Dry mouth, blurred vision,       Procedures/Studies: Ct Head Wo Contrast  Result Date: 11/06/2018 CLINICAL DATA:  Altered level of consciousness. EXAM: CT HEAD WITHOUT CONTRAST TECHNIQUE: Contiguous axial images were obtained from the base of the skull through the vertex without intravenous contrast. COMPARISON:  None.  FINDINGS: Brain: No evidence of acute infarction, hemorrhage, hydrocephalus, extra-axial collection or mass lesion/mass effect. Vascular: No hyperdense vessel or unexpected calcification. Skull: Normal. Negative for fracture or focal lesion. Sinuses/Orbits: Status post cataract surgery.  Otherwise negative. Other: None. IMPRESSION: Negative head CT. Electronically Signed   By: Inge Rise M.D.   On: 11/06/2018 13:17   Ct Abdomen Pelvis W Contrast  Result Date: 11/06/2018 CLINICAL DATA:  Altered. Abdominal pain, acute and generalized. EXAM: CT ABDOMEN AND PELVIS WITH CONTRAST TECHNIQUE: Multidetector CT imaging of the abdomen and pelvis was performed using the standard protocol following bolus administration of intravenous contrast. CONTRAST:  167mL OMNIPAQUE IOHEXOL 350 MG/ML SOLN COMPARISON:  CT of the chest on 03/07/2017, ultrasound of the abdomen complete on 05/15/2018, CT of the chest on 03/07/2017, 07/29/2015, and 02/15/2015 FINDINGS: Lower chest: There is new, dense consolidation LEFT LOWER lobe, associated with air bronchograms. Scattered focal tree-in-bud opacities are identified in the RIGHT LOWER lobe. Heart size is normal. Hepatobiliary: There is focal fatty infiltration adjacent to the falciform ligament. The liver is otherwise unremarkable. Gallbladder is present. Pancreas: Unremarkable. No pancreatic ductal dilatation or surrounding inflammatory changes. Spleen: There is a small amount of fluid surrounding the spleen. Spleen is normal in size and otherwise normal in opacity. No focal splenic lesions are identified. Adrenals/Urinary Tract: Adrenal glands are normal in appearance. There are tiny subcentimeter cysts within the kidneys bilaterally. No suspicious solid mass. No hydronephrosis. Ureters are unremarkable. Foley catheter decompresses the bladder. Stomach/Bowel: The stomach is unremarkable. Small bowel loops are normal in caliber. Appendectomy. Significant stool burden throughout the  colon, with large amount of stool particularly within the rectosigmoid colon. No evidence for obstructing mass. No evidence for diverticulitis. Vascular/Lymphatic: Abdominal aortic aneurysm measures 3.9 x 3.3 centimeters. There is no evidence for dissection. Significant atherosclerotic calcification. There is normal vascular opacification of the celiac axis, superior mesenteric artery, and inferior mesenteric artery. Normal appearance of the portal venous system and inferior vena cava. No retroperitoneal or mesenteric adenopathy. Reproductive: The uterus is present. There are prominent vessels in the parametrial region bilaterally, LEFT greater than RIGHT. No adnexal mass. Other: No ascites. Anterior abdominal wall is unremarkable. Musculoskeletal: No acute or significant osseous findings. IMPRESSION: 1. Dense LEFT LOWER lobe consolidation, associated with air bronchograms, suspicious for pneumonia. 2. Scattered tree-in-bud opacities in the RIGHT LOWER lobe, suspicious for infectious process.  3. Small amount of fluid surrounding the spleen, probably slightly larger when compared with prior study from 2017 and likely benign. 4. Abdominal aortic aneurysm measuring 3.9 x 3.3 cm. Recommend followup by ultrasound in 2 years. This recommendation follows ACR consensus guidelines: White Paper of the ACR Incidental Findings Committee II on Vascular Findings. J Am Coll Radiol 2013; 10:789-794. Aortic aneurysm NOS (ICD10-I71.9) 5. Prominent vessels in the parametrial region, LEFT greater than RIGHT, raising the question of pelvic congestion syndrome. 6. Foley catheter decompresses the bladder. 7. Status post appendectomy. 8. Small bilateral renal cysts. Electronically Signed   By: Nolon Nations M.D.   On: 11/06/2018 13:39   Dg Chest Port 1 View  Result Date: 11/06/2018 CLINICAL DATA:  Shortness of breath. EXAM: PORTABLE CHEST 1 VIEW COMPARISON:  Chest x-rays dated 05/29/2018 and 05/27/2018. FINDINGS: Chronic confluent  opacity at the RIGHT lung apex, not significantly changed compared to the previous exams. This was described as involving/progressive radiation change versus residual infiltrative neoplasm on chest CT of 03/07/2017, the stability in extent now suggesting benignity. Lungs are otherwise clear/stable. No pleural effusion or pneumothorax seen. Borderline cardiomegaly is stable. Overall cardiomediastinal silhouette is stable. Osseous structures about the chest are unremarkable. IMPRESSION: Stable chest x-ray. No evidence of acute cardiopulmonary abnormality. No evidence of active pneumonia or pulmonary edema. Chronic findings detailed above. Electronically Signed   By: Franki Cabot M.D.   On: 11/06/2018 08:59      Subjective: Evaluated patient this morning with patient's daughter, RN and RT at bedside.  Patient mostly unresponsive, not to call briefly opens eyes mumbles incomprehensibly and drifts back to sleep.  As per daughter, patient more alert than on admission, at times answers order to coherently and dyspnea has improved.  Discharge Exam:  Vitals:   11/07/18 0110 11/07/18 0825 11/07/18 1150 11/07/18 1248  BP:      Pulse:  (!) 104 (!) 105 (!) 125  Resp: 17 18 (!) 21 (!) 21  Temp:      TempSrc:      SpO2:  98% 98% (!) 83%  Weight:      Height:        General exam: Elderly female, moderately built, frail, poorly nourished, chronically ill looking lying propped up in bed with mild respiratory distress. Respiratory system: Reduced breath sounds bilaterally with scattered few expiratory rhonchi and basal crackles.  On BiPAP.  Mild increased work of breathing. Cardiovascular system: S1 & S2 heard, RRR. No JVD, murmurs, rubs, gallops or clicks.  Chronic bilateral leg edema with skin changes.  Edema extends up to the thighs. Gastrointestinal system: Abdomen is nondistended, soft and nontender. No organomegaly or masses felt. Normal bowel sounds heard. Central nervous system: Mental status as  noted above. No focal neurological deficits. Extremities: Edema as discussed above.  Spontaneously moves all extremities. Skin: No rashes, lesions or ulcers Psychiatry: Judgement and insight impaired. Mood & affect cannot be assessed.    The results of significant diagnostics from this hospitalization (including imaging, microbiology, ancillary and laboratory) are listed below for reference.     Microbiology: Recent Results (from the past 240 hour(s))  SARS Coronavirus 2 (CEPHEID - Performed in [place] hospital lab), Hosp Order     Status: None   Collection Time: 11/06/18  8:10 AM   Specimen: Nasopharyngeal Swab  Result Value Ref Range Status   SARS Coronavirus 2 NEGATIVE NEGATIVE Final    Comment: (NOTE) If result is NEGATIVE SARS-CoV-2 target nucleic acids are NOT DETECTED. The  SARS-CoV-2 RNA is generally detectable in upper and lower  respiratory specimens during the acute phase of infection. The lowest  concentration of SARS-CoV-2 viral copies this assay can detect is 250  copies / mL. A negative result does not preclude SARS-CoV-2 infection  and should not be used as the sole basis for treatment or other  patient management decisions.  A negative result may occur with  improper specimen collection / handling, submission of specimen other  than nasopharyngeal swab, presence of viral mutation(s) within the  areas targeted by this assay, and inadequate number of viral copies  (<250 copies / mL). A negative result must be combined with clinical  observations, patient history, and epidemiological information. If result is POSITIVE SARS-CoV-2 target nucleic acids are DETECTED. The SARS-CoV-2 RNA is generally detectable in upper and lower  respiratory specimens dur ing the acute phase of infection.  Positive  results are indicative of active infection with SARS-CoV-2.  Clinical  correlation with patient history and other diagnostic information is  necessary to determine patient  infection status.  Positive results do  not rule out bacterial infection or co-infection with other viruses. If result is PRESUMPTIVE POSTIVE SARS-CoV-2 nucleic acids MAY BE PRESENT.   A presumptive positive result was obtained on the submitted specimen  and confirmed on repeat testing.  While 2019 novel coronavirus  (SARS-CoV-2) nucleic acids may be present in the submitted sample  additional confirmatory testing may be necessary for epidemiological  and / or clinical management purposes  to differentiate between  SARS-CoV-2 and other Sarbecovirus currently known to infect humans.  If clinically indicated additional testing with an alternate test  methodology [phone]) is advised. The SARS-CoV-2 RNA is generally  detectable in upper and lower respiratory sp ecimens during the acute  phase of infection. The expected result is Negative. Fact Sheet for Patients:  StrictlyIdeas.no Fact Sheet for Healthcare Providers: BankingDealers.co.za This test is not yet approved or cleared by the [place] FDA and has been authorized for detection and/or diagnosis of SARS-CoV-2 by FDA under an Emergency Use Authorization (EUA).  This EUA will remain in effect (meaning this test can be used) for the duration of the COVID-19 declaration under Section 564(b)(1) of the Act, 21 U.S.C. section 360bbb-3(b)(1), unless the authorization is terminated or revoked sooner. Performed at [facility], [place] [address], [place], [place] [address]      Labs: CBC: Recent Labs  Lab 11/06/18 [phone] 11/06/18 0912 11/07/18 0504  WBC 14.5*  --  16.9*  NEUTROABS 13.0*  --   --   HGB 12.0 12.6 12.3  HCT 39.2 37.0 41.4  MCV 91.8  --  93.7  PLT 302  --  354   Basic Metabolic Panel: Recent Labs  Lab 11/06/18 0812 11/06/18 0912 11/07/18 0504  NA 137 133* 138  K 4.1 3.8 4.8  CL 91*  --  94*  CO2 36*  --  34*  GLUCOSE 118*  --  114*  BUN 12  --  15   CREATININE 0.63  --  0.61  CALCIUM 8.3*  --  8.5*   Liver Function Tests: No results for input(s): AST, ALT, ALKPHOS, BILITOT, PROT, ALBUMIN in the last 168 hours. BNP (last 3 results) Recent Labs    11/06/18 0812  BNP 271.2*   Cardiac Enzymes: No results for input(s): CKTOTAL, CKMB, CKMBINDEX, TROPONINI in the last 168 hours. CBG: No results for input(s): GLUCAP in the last 168 hours. Hgb A1c No results for input(s):  HGBA1C in the last 72 hours. Lipid Profile No results for input(s): CHOL, HDL, LDLCALC, TRIG, CHOLHDL, LDLDIRECT in the last 72 hours. Thyroid function studies No results for input(s): TSH, T4TOTAL, T3FREE, THYROIDAB in the last 72 hours.  Invalid input(s): FREET3 Anemia work up No results for input(s): VITAMINB12, FOLATE, FERRITIN, TIBC, IRON, RETICCTPCT in the last 72 hours. Urinalysis    Component Value Date/Time   COLORURINE STRAW (A) 05/29/2018 0900   APPEARANCEUR CLEAR 05/29/2018 0900   LABSPEC 1.005 05/29/2018 0900   LABSPEC 1.005 05/17/2015 1301   PHURINE 8.0 05/29/2018 0900   GLUCOSEU NEGATIVE 05/29/2018 0900   GLUCOSEU Negative 05/17/2015 1301   HGBUR NEGATIVE 05/29/2018 0900   BILIRUBINUR NEGATIVE 05/29/2018 0900   BILIRUBINUR Negative 05/17/2015 1301   KETONESUR NEGATIVE 05/29/2018 0900   PROTEINUR NEGATIVE 05/29/2018 0900   UROBILINOGEN 0.2 05/17/2015 1301   NITRITE NEGATIVE 05/29/2018 0900   LEUKOCYTESUR NEGATIVE 05/29/2018 0900   LEUKOCYTESUR Negative 05/17/2015 1301      Time coordinating discharge: 45 minutes  SIGNED:  Vernell Leep, MD, FACP, [facility]. [facility]  To contact the attending provider between 7A-7P or the covering provider during after hours 7P-7A, please log into the web site [email or web address] and access using universal [place] password for that web site. If you do not have the password, please call the hospital operator.

## 2018-11-10 ENCOUNTER — Encounter (INDEPENDENT_AMBULATORY_CARE_PROVIDER_SITE_OTHER): Payer: Medicare HMO | Admitting: Ophthalmology

## 2018-11-11 LAB — CULTURE, BLOOD (ROUTINE X 2)
Culture: NO GROWTH
Culture: NO GROWTH

## 2018-12-09 DEATH — deceased
